# Patient Record
Sex: Male | Born: 1980 | Race: White | Hispanic: No | Marital: Married | Smoking: Current every day smoker
Health system: Southern US, Community
[De-identification: ages and names within clinical notes are randomized; demographics above are authoritative.]

## PROBLEM LIST (undated history)

## (undated) DIAGNOSIS — F32A Depression, unspecified: Secondary | ICD-10-CM

## (undated) DIAGNOSIS — S8990XA Unspecified injury of unspecified lower leg, initial encounter: Secondary | ICD-10-CM

## (undated) DIAGNOSIS — F329 Major depressive disorder, single episode, unspecified: Secondary | ICD-10-CM

## (undated) DIAGNOSIS — I10 Essential (primary) hypertension: Secondary | ICD-10-CM

## (undated) DIAGNOSIS — F419 Anxiety disorder, unspecified: Secondary | ICD-10-CM

## (undated) HISTORY — DX: Essential (primary) hypertension: I10

## (undated) HISTORY — DX: Anxiety disorder, unspecified: F41.9

## (undated) HISTORY — DX: Depression, unspecified: F32.A

## (undated) HISTORY — PX: TONSILECTOMY, ADENOIDECTOMY, BILATERAL MYRINGOTOMY AND TUBES: SHX2538

## (undated) HISTORY — DX: Unspecified injury of unspecified lower leg, initial encounter: S89.90XA

## (undated) HISTORY — PX: DENTAL SURGERY: SHX609

## (undated) HISTORY — DX: Major depressive disorder, single episode, unspecified: F32.9

---
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
## 2005-11-25 ENCOUNTER — Ambulatory Visit: Payer: Self-pay | Admitting: Family Medicine

## 2006-01-26 ENCOUNTER — Ambulatory Visit: Payer: Self-pay | Admitting: Family Medicine

## 2006-03-11 ENCOUNTER — Ambulatory Visit: Payer: Self-pay | Admitting: Family Medicine

## 2006-06-01 DIAGNOSIS — F419 Anxiety disorder, unspecified: Secondary | ICD-10-CM | POA: Insufficient documentation

## 2006-06-01 DIAGNOSIS — G43909 Migraine, unspecified, not intractable, without status migrainosus: Secondary | ICD-10-CM | POA: Insufficient documentation

## 2006-06-01 DIAGNOSIS — F411 Generalized anxiety disorder: Secondary | ICD-10-CM

## 2006-06-03 ENCOUNTER — Ambulatory Visit: Payer: Self-pay | Admitting: Family Medicine

## 2006-06-04 DIAGNOSIS — G4700 Insomnia, unspecified: Secondary | ICD-10-CM

## 2006-06-04 DIAGNOSIS — Z87891 Personal history of nicotine dependence: Secondary | ICD-10-CM

## 2006-06-04 DIAGNOSIS — F17200 Nicotine dependence, unspecified, uncomplicated: Secondary | ICD-10-CM

## 2006-06-04 HISTORY — DX: Nicotine dependence, unspecified, uncomplicated: F17.200

## 2006-11-24 ENCOUNTER — Ambulatory Visit: Payer: Self-pay | Admitting: Family Medicine

## 2006-11-24 DIAGNOSIS — F322 Major depressive disorder, single episode, severe without psychotic features: Secondary | ICD-10-CM | POA: Insufficient documentation

## 2006-11-24 DIAGNOSIS — F329 Major depressive disorder, single episode, unspecified: Secondary | ICD-10-CM

## 2006-11-24 DIAGNOSIS — J45990 Exercise induced bronchospasm: Secondary | ICD-10-CM

## 2007-07-06 ENCOUNTER — Ambulatory Visit: Payer: Self-pay | Admitting: Family Medicine

## 2007-07-06 DIAGNOSIS — N529 Male erectile dysfunction, unspecified: Secondary | ICD-10-CM | POA: Insufficient documentation

## 2007-07-06 DIAGNOSIS — F528 Other sexual dysfunction not due to a substance or known physiological condition: Secondary | ICD-10-CM | POA: Insufficient documentation

## 2008-02-24 ENCOUNTER — Ambulatory Visit: Payer: Self-pay | Admitting: Family Medicine

## 2008-02-24 DIAGNOSIS — F410 Panic disorder [episodic paroxysmal anxiety] without agoraphobia: Secondary | ICD-10-CM | POA: Insufficient documentation

## 2008-05-07 ENCOUNTER — Ambulatory Visit: Payer: Self-pay | Admitting: Family Medicine

## 2008-05-07 DIAGNOSIS — I10 Essential (primary) hypertension: Secondary | ICD-10-CM | POA: Insufficient documentation

## 2008-05-28 ENCOUNTER — Ambulatory Visit: Payer: Self-pay | Admitting: Family Medicine

## 2008-06-08 ENCOUNTER — Telehealth: Payer: Self-pay | Admitting: Family Medicine

## 2008-06-11 ENCOUNTER — Ambulatory Visit: Payer: Self-pay | Admitting: Family Medicine

## 2008-07-30 ENCOUNTER — Ambulatory Visit: Payer: Self-pay | Admitting: Family Medicine

## 2008-07-31 ENCOUNTER — Encounter: Payer: Self-pay | Admitting: Family Medicine

## 2008-07-31 LAB — CONVERTED CEMR LAB
BUN: 15 mg/dL (ref 6–23)
CO2: 21 meq/L (ref 19–32)
Calcium: 9.7 mg/dL (ref 8.4–10.5)
Chloride: 101 meq/L (ref 96–112)
Creatinine, Ser: 1.1 mg/dL (ref 0.40–1.50)
Glucose, Bld: 95 mg/dL (ref 70–99)
Potassium: 3.9 meq/L (ref 3.5–5.3)
Sodium: 138 meq/L (ref 135–145)

## 2008-10-11 ENCOUNTER — Ambulatory Visit: Payer: Self-pay | Admitting: Family Medicine

## 2008-10-11 LAB — CONVERTED CEMR LAB: Rapid Strep: NEGATIVE

## 2008-12-10 ENCOUNTER — Ambulatory Visit: Payer: Self-pay | Admitting: Family Medicine

## 2009-02-05 ENCOUNTER — Ambulatory Visit: Payer: Self-pay | Admitting: Family Medicine

## 2009-04-22 ENCOUNTER — Telehealth (INDEPENDENT_AMBULATORY_CARE_PROVIDER_SITE_OTHER): Payer: Self-pay | Admitting: *Deleted

## 2009-08-29 ENCOUNTER — Telehealth: Payer: Self-pay | Admitting: Family Medicine

## 2009-09-25 ENCOUNTER — Ambulatory Visit: Payer: Self-pay | Admitting: Family Medicine

## 2009-09-25 ENCOUNTER — Telehealth (INDEPENDENT_AMBULATORY_CARE_PROVIDER_SITE_OTHER): Payer: Self-pay | Admitting: *Deleted

## 2009-09-27 ENCOUNTER — Telehealth (INDEPENDENT_AMBULATORY_CARE_PROVIDER_SITE_OTHER): Payer: Self-pay | Admitting: *Deleted

## 2009-10-01 ENCOUNTER — Encounter: Payer: Self-pay | Admitting: Family Medicine

## 2009-10-11 ENCOUNTER — Encounter: Payer: Self-pay | Admitting: Family Medicine

## 2009-10-18 ENCOUNTER — Telehealth: Payer: Self-pay | Admitting: Family Medicine

## 2010-09-23 NOTE — Progress Notes (Signed)
Summary: Alprazolam refill  Phone Note Refill Request Message from:  Pharmacy on August 29, 2009 4:51 PM  Refills Requested: Medication #1:  ALPRAZOLAM 0.5 MG  TABS 1 tab by mouth daily as needed for anxiety Initial call taken by: Payton Spark CMA,  August 29, 2009 4:51 PM    Prescriptions: ALPRAZOLAM 0.5 MG  TABS (ALPRAZOLAM) 1 tab by mouth daily as needed for anxiety  #30 x 0   Entered and Authorized by:   Seymour Bars DO   Signed by:   Seymour Bars DO on 08/29/2009   Method used:   Printed then faxed to ...       [facility]  [place] [phone]       [address] [place], [place]  [address]       Ph: [phone] or [phone]       Fax: [phone]   RxID:   [id number]   Appended Document: Alprazolam refill Pt aware

## 2010-09-23 NOTE — Medication Information (Signed)
Summary: Possible Nonadherence with Lisinopril/[employer]  Possible Nonadherence with Lisinopril/[employer]   Imported By: Lanelle Bal 10/17/2009 12:52:45  _____________________________________________________________________  External Attachment:    Type:   Image     Comment:   External Document

## 2010-09-23 NOTE — Progress Notes (Signed)
Summary: rx  Phone Note Refill Request Message from:  Pharm[facility] on [facility] [address] [id number]  Refills Requested: Medication #1:  PAXIL 20 MG  TABS 1 tab by mouth daily Initial call taken by: Kandice Hams,  September 25, 2009 7:57 AM    Prescriptions: PAXIL 20 MG  TABS (PAROXETINE HCL) 1 tab by mouth daily  #30 x 0   Entered by:   Kandice Hams   Authorized by:   Seymour Bars DO   Signed by:   Kandice Hams on 09/25/2009   Method used:   Faxed to ...       [facility] [address] (retail)       [address]       [place], [place]  [address]       Ph: [phone] or [phone]       Fax: [phone]   RxID:   [id number]

## 2010-09-23 NOTE — Progress Notes (Signed)
Summary: Alprazolam refill  Phone Note Refill Request   Refills Requested: Medication #1:  ALPRAZOLAM 0.5 MG  TABS 1 tab by mouth daily as needed for anxiety Initial call taken by: Payton Spark CMA,  October 18, 2009 3:06 PM    Prescriptions: ALPRAZOLAM 0.5 MG  TABS (ALPRAZOLAM) 1 tab by mouth daily as needed for anxiety  #30 x 0   Entered and Authorized by:   Seymour Bars DO   Signed by:   Seymour Bars DO on 10/18/2009   Method used:   Printed then faxed to ...       [facility] [address] (retail)       [address]       [place], [place]  [address]       Ph: [phone] or [phone]       Fax: [phone]   RxID:   [id number]

## 2010-09-23 NOTE — Assessment & Plan Note (Signed)
Summary: ED   Vital Signs:  Patient profile:   30 year old male Height:      71 inches Weight:      216 pounds Pulse rate:   97 / minute BP sitting:   116 / 64  (left arm) Cuff size:   regular  Vitals Entered By: Kathlene November (September 25, 2009 2:17 PM) CC: home BP readings high 160/100. Hasn't smoked in 4 days   Primary Care Provider:  Seymour Bars DO  CC:  home BP readings high 160/100. Hasn't smoked in 4 days.  History of Present Illness: 30 yo WM presents for problems with ED.  This is a new problem.  His wife left him about 6 mos ago and he's had the stress of separation, sharing time with the kids.  He has a new girlfiend and has had some problems getting an erection.  He is on muliple meds that can cause this - Paxil, BP meds.  His HTN itself is a risk factor for ED.  He denies feeling depressed and his friends have been supportive.  He is trying to quit smoking again.    His home BPs have been running higher than here.    Current Medications (verified): 1)  Alprazolam 0.5 Mg  Tabs (Alprazolam) .Marland Kitchen.. 1 Tab By Mouth Daily As Needed For Anxiety 2)  Ventolin Hfa 108 (90 Base) Mcg/act Aers (Albuterol Sulfate) .Marland Kitchen.. 1-2 Puffs Q 4-6 H Prn 3)  Paxil 20 Mg  Tabs (Paroxetine Hcl) .Marland Kitchen.. 1 Tab By Mouth Daily 4)  Lisinopril-Hydrochlorothiazide 20-12.5 Mg Tabs (Lisinopril-Hydrochlorothiazide) .Marland Kitchen.. 1 Tab By Mouth Daily  Allergies (verified): No Known Drug Allergies  Comments:  Nurse/Medical Assistant: The patient's medications and allergies were reviewed with the patient and were updated in the Medication and Allergy Lists. Kathlene November (September 25, 2009 2:18 PM)  Past History:  Past Medical History: Reviewed history from 02/05/2009 and no changes required. previous knee injury anxiety/ depression mild intermittent asthma HTN  Family History: Reviewed history from 06/01/2006 and no changes required. father committed suicide, depression, mother high chol, HTN  Social  History: Works [employer]. Separated from  Mabel and has 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.  Quit smoking 3/07 after 4 pk years.  Runs 15-20  min 2 x a wk.  Review of Systems      See HPI  Physical Exam  General:  alert, well-developed, well-nourished, and well-hydrated.   Head:  normocephalic and atraumatic.   Neck:  no masses.   Lungs:  Normal respiratory effort, chest expands symmetrically. Lungs are clear to auscultation, no crackles or wheezes. Heart:  Normal rate and regular rhythm. S1 and S2 normal without gallop, murmur, click, rub or other extra sounds. Skin:  color normal.   Psych:  good eye contact, not anxious appearing, and not depressed appearing.     Impression & Recommendations:  Problem # 1:  HYPERTENSION, BENIGN (ICD-401.1) BP looks great here so will continue current meds.  I truly wonder if his home BP cuff is accurate.  He will return with his home meter in 3 wks. His updated medication list for this problem includes:    Lisinopril-hydrochlorothiazide 20-12.5 Mg Tabs (Lisinopril-hydrochlorothiazide) .Marland Kitchen... 1 tab by mouth daily  BP today: 116/64 Prior BP: 130/78 (02/05/2009)  Prior 10 Yr Risk Heart Disease: Not enough information (05/07/2008)  Labs Reviewed: K+: 3.9 (07/31/2008) Creat: : 1.10 (07/31/2008)     Problem # 2:  ERECTILE DYSFUNCTION (ICD-302.72) Assessment: New Likely to be related  to RX meds or recent stressor of marrital separation/ new relationships. Trial of Cialis. His updated medication list for this problem includes:    Cialis 10 Mg Tabs (Tadalafil) .Marland Kitchen... 1 tab by mouth as needed for erectile dysfunction  Orders: T-Testosterone; Total [phone])  Problem # 3:  DISORDER, DEPRESSIVE NEC (ICD-311) Depression/ Panic has much improved with Paxil -- continue. His updated medication list for this problem includes:    Alprazolam 0.5 Mg Tabs (Alprazolam) .Marland Kitchen... 1 tab by mouth daily as needed for anxiety    Paxil 20 Mg Tabs (Paroxetine hcl)  .Marland Kitchen... 1 tab by mouth daily  Complete Medication List: 1)  Alprazolam 0.5 Mg Tabs (Alprazolam) .Marland Kitchen.. 1 tab by mouth daily as needed for anxiety 2)  Ventolin Hfa 108 (90 Base) Mcg/act Aers (Albuterol sulfate) .Marland Kitchen.. 1-2 puffs q 4-6 h prn 3)  Paxil 20 Mg Tabs (Paroxetine hcl) .Marland Kitchen.. 1 tab by mouth daily 4)  Lisinopril-hydrochlorothiazide 20-12.5 Mg Tabs (Lisinopril-hydrochlorothiazide) .Marland Kitchen.. 1 tab by mouth daily 5)  Cialis 10 Mg Tabs (Tadalafil) .Marland Kitchen.. 1 tab by mouth as needed for erectile dysfunction  Other Orders: T-Comprehensive Metabolic Panel [phone]) T-Lipid Profile ([id number])  Patient Instructions: 1)  Have fasting labs updated. 2)  Will call you w/ results. 3)  Try Cialis as needed. 4)  Call if any problems. 5)  REturn for a nurse BP check with your home cuff in 3-4 wks. 6)  Return for f/u BP in 6 mos. Prescriptions: LISINOPRIL-HYDROCHLOROTHIAZIDE 20-12.5 MG TABS (LISINOPRIL-HYDROCHLOROTHIAZIDE) 1 tab by mouth daily  #30 x 6   Entered and Authorized by:   Seymour Bars DO   Signed by:   Seymour Bars DO on 09/25/2009   Method used:   Electronically to        [facility] [phone][facility] (retail)       [address]       [place], [place]  [address]       Ph: [phone] or [phone]       Fax: [phone]   RxID:   [id number] CIALIS 10 MG TABS (TADALAFIL) 1 tab by mouth as needed for erectile dysfunction  #12 x 1   Entered and Authorized by:   Seymour Bars DO   Signed by:   Seymour Bars DO on 09/25/2009   Method used:   Electronically to        [facility] [phone]* (retail)       [address]       [place], [place]  [address]       Ph: [phone] or [phone]       Fax: [phone]   RxID:   [id number]

## 2010-09-23 NOTE — Progress Notes (Signed)
Summary: PRIOR AUTH   Phone Note From Pharmacy   [facility]  [place] [phone][facility] Summary of Call: prior auth required [employer] [phone] CIALIS 10 MG Initial call taken by: Kandice Hams,  September 27, 2009 8:47 AM  Follow-up for Phone Call        [employer] WILL ADVISE ON PRIOR AUTH ON CIALIS WITHIN 48 HOURS HOWEVER THE MAX HE CAN GET AT ONE TIME IS 8 Roselle Locus  September 30, 2009 2:59 PM

## 2010-09-23 NOTE — Medication Information (Signed)
Summary: Denial for Cialis/[employer]  Denial for Cialis/[employer]   Imported By: Lanelle Bal 10/11/2009 11:06:29  _____________________________________________________________________  External Attachment:    Type:   Image     Comment:   External Document

## 2010-09-29 ENCOUNTER — Telehealth (INDEPENDENT_AMBULATORY_CARE_PROVIDER_SITE_OTHER): Payer: Self-pay | Admitting: *Deleted

## 2010-10-07 ENCOUNTER — Ambulatory Visit: Payer: Self-pay | Admitting: Family Medicine

## 2010-10-09 NOTE — Progress Notes (Signed)
Summary: Refill meds  Phone Note Refill Request Call back at [phone] Message from:  Patient on September 29, 2010 4:47 PM  Refills Requested: Medication #1:  PAXIL 20 MG  TABS 1 tab by mouth daily   Dosage confirmed as above?Dosage Confirmed   Brand Name Necessary? No   Supply Requested: 1 month  Medication #2:  ALPRAZOLAM 0.5 MG  TABS 1 tab by mouth daily as needed for anxiety   Dosage confirmed as above?Dosage Confirmed   Brand Name Necessary? No   Supply Requested: 1 month  Method Requested: Electronic Initial call taken by: Lannette Donath,  September 29, 2010 4:47 PM  Follow-up for Phone Call        Pt to make OV Follow-up by: Payton Spark CMA,  September 29, 2010 4:53 PM

## 2010-10-22 ENCOUNTER — Ambulatory Visit
Admission: RE | Admit: 2010-10-22 | Discharge: 2010-10-22 | Disposition: A | Discharge status: Home / Self Care | Payer: 59 | Source: Ambulatory Visit | Attending: Family Medicine | Admitting: Family Medicine

## 2010-10-22 ENCOUNTER — Encounter: Payer: Self-pay | Admitting: Family Medicine

## 2010-10-22 ENCOUNTER — Ambulatory Visit (INDEPENDENT_AMBULATORY_CARE_PROVIDER_SITE_OTHER): Payer: 59 | Admitting: Family Medicine

## 2010-10-22 ENCOUNTER — Other Ambulatory Visit: Payer: Self-pay | Admitting: Family Medicine

## 2010-10-22 DIAGNOSIS — IMO0002 Reserved for concepts with insufficient information to code with codable children: Secondary | ICD-10-CM | POA: Insufficient documentation

## 2010-10-22 DIAGNOSIS — M79609 Pain in unspecified limb: Secondary | ICD-10-CM | POA: Insufficient documentation

## 2010-10-23 ENCOUNTER — Telehealth: Payer: Self-pay | Admitting: Family Medicine

## 2010-10-28 ENCOUNTER — Telehealth: Payer: Self-pay | Admitting: Family Medicine

## 2010-10-29 ENCOUNTER — Encounter: Payer: Self-pay | Admitting: Family Medicine

## 2010-10-29 ENCOUNTER — Ambulatory Visit (INDEPENDENT_AMBULATORY_CARE_PROVIDER_SITE_OTHER): Payer: 59 | Admitting: Family Medicine

## 2010-10-29 DIAGNOSIS — M25519 Pain in unspecified shoulder: Secondary | ICD-10-CM

## 2010-10-29 DIAGNOSIS — IMO0002 Reserved for concepts with insufficient information to code with codable children: Secondary | ICD-10-CM

## 2010-10-30 NOTE — Progress Notes (Signed)
Summary: Pain meds  Phone Note Call from Patient   Caller: Patient Summary of Call: Pt states Percocet is not helping at all w/ pain. Pt would like to know if you can change Rx or increase dose. Please advise. Initial call taken by: Payton Spark CMA,  October 23, 2010 12:00 PM  Follow-up for Phone Call        can increase to 2 tabs q 6 hrs as needed severe pain -- do not exceed this or add anymore tylenol.  Can take Aleve 2 tabs by mouth two times a day in addition -- this is the max.  Will send in a muscle relaxer to help with pain. Follow-up by: Seymour Bars DO,  October 23, 2010 7:39 PM    New/Updated Medications: FLEXERIL 10 MG TABS (CYCLOBENZAPRINE HCL) 1/2 to 1 tab by mouth q 8 hrs as needed muscle spasm Prescriptions: FLEXERIL 10 MG TABS (CYCLOBENZAPRINE HCL) 1/2 to 1 tab by mouth q 8 hrs as needed muscle spasm  #30 x 0   Entered and Authorized by:   Seymour Bars DO   Signed by:   Seymour Bars DO on 10/23/2010   Method used:   Electronically to        [facility] [phone]* (retail)       [address]       [place], [place]  [address]       Ph: [phone] or [phone]       Fax: [phone]   RxID:   [id number]   Appended Document: Pain meds Pt aware of the above
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
## 2010-11-04 NOTE — Progress Notes (Signed)
Summary: [facility]-Refill Percocet  Phone Note Call from Patient Call back at [phone]   Caller: Patient Reason for Call: Refill Medication Summary of Call: pt increased percocet per 10/23/10 phone note and is now out of meds.  Pt has appt to follow up with Dr. Cathey Endow tomorrow.  Pt is taking Aleve and Flexeril, but continues to have pain.  Pt would like refill on percocet.  Advised pt Dr. Cathey Endow out of office and Dr. Eppie Gibson may not refill.  Pt voices understanding and is agreeable.  Pt is willing to come today for office visit if needed. Initial call taken by: Francee Piccolo CMA Duncan Dull),  October 28, 2010 9:59 AM  Follow-up for Phone Call        Can call in 4 tabs of hydrocodone 5/500mg  2 every 6 hours as needed,  until visit tomorrow with Dr. Cathey Endow.  Follow-up by: Nani Gasser MD,  October 28, 2010 10:06 AM  Additional Follow-up for Phone Call Additional follow up Details #1::        rx telephoned to pharm.  Pt aware. Additional Follow-up by: Francee Piccolo CMA (AAMA),  October 28, 2010 11:00 AM    New/Updated Medications: HYDROCODONE-ACETAMINOPHEN 5-500 MG TABS (HYDROCODONE-ACETAMINOPHEN) take 2 tablets every 6 hours as needed Prescriptions: HYDROCODONE-ACETAMINOPHEN 5-500 MG TABS (HYDROCODONE-ACETAMINOPHEN) take 2 tablets every 6 hours as needed  #4 x 0   Entered by:   Francee Piccolo CMA (AAMA)   Authorized by:   Nani Gasser MD   Signed by:   Francee Piccolo CMA (AAMA) on 10/28/2010   Method used:   Telephoned to ...       [facility] [address] (retail)       [address]       [place], [place]  [address]       Ph: [phone] or [phone]       Fax: [phone]   RxID:   [id number]   Appended Document: [facility]-Refill Percocet rx called to [facility].

## 2011-01-13 ENCOUNTER — Other Ambulatory Visit: Payer: Self-pay | Admitting: Family Medicine

## 2011-01-14 ENCOUNTER — Other Ambulatory Visit: Payer: Self-pay | Admitting: Family Medicine

## 2011-01-14 DIAGNOSIS — F329 Major depressive disorder, single episode, unspecified: Secondary | ICD-10-CM

## 2011-01-14 MED ORDER — PAROXETINE HCL 20 MG PO TABS: 20.0000 mg | ORAL_TABLET | ORAL | Status: DC
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
## 2011-02-09 ENCOUNTER — Other Ambulatory Visit: Payer: Self-pay | Admitting: Family Medicine

## 2011-02-09 ENCOUNTER — Ambulatory Visit (INDEPENDENT_AMBULATORY_CARE_PROVIDER_SITE_OTHER): Payer: 59 | Admitting: Family Medicine

## 2011-02-09 ENCOUNTER — Encounter: Payer: Self-pay | Admitting: Family Medicine

## 2011-02-09 DIAGNOSIS — L819 Disorder of pigmentation, unspecified: Secondary | ICD-10-CM

## 2011-02-09 DIAGNOSIS — F528 Other sexual dysfunction not due to a substance or known physiological condition: Secondary | ICD-10-CM

## 2011-02-09 DIAGNOSIS — L810 Postinflammatory hyperpigmentation: Secondary | ICD-10-CM

## 2011-02-09 DIAGNOSIS — F410 Panic disorder [episodic paroxysmal anxiety] without agoraphobia: Secondary | ICD-10-CM

## 2011-02-09 DIAGNOSIS — I10 Essential (primary) hypertension: Secondary | ICD-10-CM

## 2011-02-09 MED ORDER — TRETINOIN 0.1 % EX CREA: TOPICAL_CREAM | Freq: Every day | CUTANEOUS | Status: DC

## 2011-02-09 MED ORDER — LISINOPRIL-HYDROCHLOROTHIAZIDE 20-12.5 MG PO TABS: 1.0000 | ORAL_TABLET | Freq: Every day | ORAL | Status: DC

## 2011-02-09 MED ORDER — ALPRAZOLAM 0.5 MG PO TABS: 0.5000 mg | ORAL_TABLET | Freq: Every day | ORAL | Status: DC | PRN

## 2011-02-09 MED ORDER — TADALAFIL 10 MG PO TABS: ORAL_TABLET | ORAL | Status: DC

## 2011-02-09 NOTE — Progress Notes (Signed)
Addended by: Ellsworth Lennox on: 02/09/2011 04:32 PM   Modules accepted: Orders

## 2011-02-09 NOTE — Assessment & Plan Note (Signed)
Likely secondary to paxil, high BP, smoking.  Will stop the Paxil first, using Cialis prn.
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
## 2011-02-09 NOTE — Assessment & Plan Note (Signed)
Much improved.  Life stressors a little better and having ED from Paxil after 1 yr of use.  Will wean him off and use Alprazolam as needed.  Let me know if ED does not improve after 3 wks.  May benefit from counseling.

## 2011-02-09 NOTE — Assessment & Plan Note (Signed)
BP at the [facility] but he forgot to take his meds today.  If erectile dysfunction does not improve off Paxil, call back.  May need to also change his BP medicine.

## 2011-02-09 NOTE — Assessment & Plan Note (Signed)
Will try Tretinoin topical cream on the R hand scar to see if this lightens the scar.  If not working after 4 wks, we can try hydroquinone or get him in with plastic surgery.

## 2011-02-25 ENCOUNTER — Encounter: Payer: Self-pay | Admitting: Family Medicine

## 2011-02-26 ENCOUNTER — Ambulatory Visit (INDEPENDENT_AMBULATORY_CARE_PROVIDER_SITE_OTHER): Payer: 59 | Admitting: Family Medicine

## 2011-02-26 ENCOUNTER — Encounter: Payer: Self-pay | Admitting: Family Medicine

## 2011-02-26 DIAGNOSIS — F528 Other sexual dysfunction not due to a substance or known physiological condition: Secondary | ICD-10-CM

## 2011-02-26 DIAGNOSIS — M546 Pain in thoracic spine: Secondary | ICD-10-CM | POA: Insufficient documentation

## 2011-02-26 DIAGNOSIS — S335XXA Sprain of ligaments of lumbar spine, initial encounter: Secondary | ICD-10-CM

## 2011-02-26 DIAGNOSIS — R300 Dysuria: Secondary | ICD-10-CM

## 2011-02-26 MED ORDER — OXYCODONE-ACETAMINOPHEN 5-325 MG PO TABS: 1.0000 | ORAL_TABLET | Freq: Every day | ORAL | Status: DC | PRN

## 2011-02-26 MED ORDER — METAXALONE 800 MG PO TABS: 800.0000 mg | ORAL_TABLET | Freq: Three times a day (TID) | ORAL | Status: AC

## 2011-02-26 MED ORDER — AMLODIPINE BESYLATE 5 MG PO TABS: 5.0000 mg | ORAL_TABLET | Freq: Every day | ORAL | Status: DC

## 2011-02-26 NOTE — Patient Instructions (Signed)
For back spasm:  Use Skelaxin up to 3 x a day. OK to use Aleve or ibuprofen for anti inflammatory. Use Percocet at bedtime as needed.  Change Lisinopril/ HCTZ to Norvasc for blood pressure and use Norvasc 5 mg/ day.  Trial of viagra as needed.  Return for f/u BP / ED in 2-3 mos.
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
## 2011-02-26 NOTE — Assessment & Plan Note (Signed)
Addressed, treated and is improving per UC.

## 2011-02-26 NOTE — Assessment & Plan Note (Signed)
Still a problem off Paxil.  Cialis didn't work well.  Will have him try samples of viagra. I do think given his age and stable relationship that it could be from his BP medicine.  Will change him to Norvasc and call if any problems.  F/u BP in 3 mos.

## 2011-03-03 ENCOUNTER — Telehealth: Payer: Self-pay | Admitting: Family Medicine

## 2011-03-03 MED ORDER — OXYCODONE-ACETAMINOPHEN 5-325 MG PO TABS: ORAL_TABLET | ORAL | Status: DC

## 2011-03-03 NOTE — Telephone Encounter (Signed)
Pt informed another Rx of stronger dose printed out and he could pup.  Told pt for short term only. Philip Newcomer, LPN [facility]

## 2011-03-03 NOTE — Telephone Encounter (Signed)
RX printed for pick up.  This is short term only.

## 2011-03-03 NOTE — Telephone Encounter (Signed)
Pt called and needs a refill of his Percocet 5/325 mg and he feels he needs a stronger dose.  Given pain  med on 02-26-11 and it was ordered to take daily PRN, and pt now out.  Averaging taking 2 daily.  Please advise.  Was also given skelexin  And told could use ibuprofen, heat and avoid heavy lifting.  Told to follow up in two weeks. Plan:  Routed to Dr. Arlice Colt, LPN [facility]

## 2011-03-10 ENCOUNTER — Other Ambulatory Visit: Payer: Self-pay | Admitting: Family Medicine

## 2011-04-10 ENCOUNTER — Other Ambulatory Visit: Payer: Self-pay | Admitting: Family Medicine

## 2011-04-10 MED ORDER — TADALAFIL 20 MG PO TABS: 20.0000 mg | ORAL_TABLET | Freq: Every day | ORAL | Status: DC | PRN

## 2011-04-10 NOTE — Telephone Encounter (Signed)
OK for the 20mg  sample.

## 2011-04-10 NOTE — Telephone Encounter (Signed)
Pt notified that he could pup sample of cialis 20 mg # 3 pills of samples. Jarvis Newcomer, LPN [facility]

## 2011-04-10 NOTE — Telephone Encounter (Signed)
Pt calling requesting cialis 20 mg samples.   Plan:  Reviewed medication list in chart and no ED meds are listed.  I see in June where pt was last seen the cialis 10 mg was discontinued.  Pt stated he got cialis 20 mg since that time as samples, but I do not see a telephone note to this affect.   Can you please give me the okay to give this pt samples of cialis 20 mg.  Pt stated the 10 mg was dc'd because they didn't work but when changed to the 20 mg it worked , but requesting samples because he is a appeal with his insurance co to get the med covered.   Routed to Dr. Marlyne Beards, LPN [facility]

## 2011-05-15 ENCOUNTER — Telehealth: Payer: Self-pay | Admitting: Family Medicine

## 2011-05-15 MED ORDER — TADALAFIL 20 MG PO TABS: 20.0000 mg | ORAL_TABLET | Freq: Every day | ORAL | Status: DC | PRN

## 2011-05-15 NOTE — Telephone Encounter (Signed)
Pt called wanting samples of cialis medication.  In battle with insurance trying to get them to pay for the medication and still the case is pending. Plan:  Cialis 20 mg #3 samples given.  Pt informed. Jarvis Newcomer, LPN [facility]

## 2011-06-16 ENCOUNTER — Ambulatory Visit: Payer: 59 | Admitting: Family Medicine

## 2011-10-29 ENCOUNTER — Telehealth: Payer: Self-pay | Admitting: *Deleted

## 2011-10-29 ENCOUNTER — Other Ambulatory Visit: Payer: Self-pay | Admitting: *Deleted

## 2011-10-29 MED ORDER — TADALAFIL 20 MG PO TABS: 20.0000 mg | ORAL_TABLET | Freq: Every day | ORAL | Status: DC | PRN

## 2011-10-29 NOTE — Telephone Encounter (Signed)
Pt would like to know if we can refill his xanax. Xanax was given once back in 01/2011. Please advise.

## 2011-10-29 NOTE — Telephone Encounter (Signed)
Ok to refill 

## 2011-10-30 ENCOUNTER — Other Ambulatory Visit: Payer: Self-pay | Admitting: *Deleted

## 2011-10-30 MED ORDER — ALPRAZOLAM 0.5 MG PO TABS: 0.5000 mg | ORAL_TABLET | Freq: Every evening | ORAL | Status: DC | PRN

## 2011-10-30 NOTE — Telephone Encounter (Signed)
Rx printed

## 2011-11-02 ENCOUNTER — Other Ambulatory Visit: Payer: Self-pay | Admitting: *Deleted

## 2011-11-02 MED ORDER — TADALAFIL 20 MG PO TABS: 20.0000 mg | ORAL_TABLET | Freq: Every day | ORAL | Status: DC | PRN

## 2011-11-06 ENCOUNTER — Other Ambulatory Visit: Payer: Self-pay | Admitting: *Deleted

## 2011-11-06 MED ORDER — TADALAFIL 20 MG PO TABS: 20.0000 mg | ORAL_TABLET | Freq: Every day | ORAL | Status: DC | PRN

## 2011-11-10 ENCOUNTER — Encounter: Payer: Self-pay | Admitting: Family Medicine

## 2011-11-10 ENCOUNTER — Ambulatory Visit (INDEPENDENT_AMBULATORY_CARE_PROVIDER_SITE_OTHER): Payer: 59 | Admitting: Family Medicine

## 2011-11-10 VITALS — BP 143/90 | HR 80 | Ht 71.0 in | Wt 221.0 lb

## 2011-11-10 DIAGNOSIS — M25462 Effusion, left knee: Secondary | ICD-10-CM

## 2011-11-10 DIAGNOSIS — I10 Essential (primary) hypertension: Secondary | ICD-10-CM

## 2011-11-10 DIAGNOSIS — N529 Male erectile dysfunction, unspecified: Secondary | ICD-10-CM

## 2011-11-10 DIAGNOSIS — S8002XA Contusion of left knee, initial encounter: Secondary | ICD-10-CM

## 2011-11-10 DIAGNOSIS — M25469 Effusion, unspecified knee: Secondary | ICD-10-CM

## 2011-11-10 MED ORDER — VALSARTAN-HYDROCHLOROTHIAZIDE 80-12.5 MG PO TABS: 1.0000 | ORAL_TABLET | Freq: Every day | ORAL | Status: DC

## 2011-11-10 MED ORDER — OXYCODONE-ACETAMINOPHEN 5-325 MG PO TABS: 1.0000 | ORAL_TABLET | ORAL | Status: DC | PRN

## 2011-11-10 MED ORDER — OXYCODONE-ACETAMINOPHEN 5-325 MG PO TABS: 1.0000 | ORAL_TABLET | Freq: Three times a day (TID) | ORAL | Status: DC | PRN

## 2011-11-10 MED ORDER — MELOXICAM 15 MG PO TABS: 15.0000 mg | ORAL_TABLET | Freq: Every day | ORAL | Status: DC

## 2011-11-10 MED ORDER — PANTOPRAZOLE SODIUM 40 MG PO TBEC: 40.0000 mg | DELAYED_RELEASE_TABLET | Freq: Every day | ORAL | Status: DC

## 2011-11-10 MED ORDER — TADALAFIL 5 MG PO TABS: 5.0000 mg | ORAL_TABLET | Freq: Every day | ORAL | Status: DC | PRN

## 2011-11-10 NOTE — Patient Instructions (Signed)
Hypertension As your heart beats, it forces blood through your arteries. This force is your blood pressure. If the pressure is too high, it is called hypertension (HTN) or high blood pressure. HTN is dangerous because you may have it and not know it. High blood pressure may mean that your heart has to work harder to pump blood. Your arteries may be narrow or stiff. The extra work puts you at risk for heart disease, stroke, and other problems.  Blood pressure consists of two numbers, a higher number over a lower, 110/72, for example. It is stated as "110 over 72." The ideal is below 120 for the top number (systolic) and under 80 for the bottom (diastolic). Write down your blood pressure today. You should pay close attention to your blood pressure if you have certain conditions such as:  Heart failure.   Prior heart attack.   Diabetes   Chronic kidney disease.   Prior stroke.   Multiple risk factors for heart disease.  To see if you have HTN, your blood pressure should be measured while you are seated with your arm held at the level of the heart. It should be measured at least twice. A one-time elevated blood pressure reading (especially in the Emergency Department) does not mean that you need treatment. There may be conditions in which the blood pressure is different between your right and left arms. It is important to see your caregiver soon for a recheck. Most people have essential hypertension which means that there is not a specific cause. This type of high blood pressure may be lowered by changing lifestyle factors such as:  Stress.   Smoking.   Lack of exercise.   Excessive weight.   Drug/tobacco/alcohol use.   Eating less salt.  Most people do not have symptoms from high blood pressure until it has caused damage to the body. Effective treatment can often prevent, delay or reduce that damage. TREATMENT  When a cause has been identified, treatment for high blood pressure is  directed at the cause. There are a large number of medications to treat HTN. These fall into several categories, and your caregiver will help you select the medicines that are best for you. Medications may have side effects. You should review side effects with your caregiver. If your blood pressure stays high after you have made lifestyle changes or started on medicines,   Your medication(s) may need to be changed.   Other problems may need to be addressed.   Be certain you understand your prescriptions, and know how and when to take your medicine.   Be sure to follow up with your caregiver within the time frame advised (usually within two weeks) to have your blood pressure rechecked and to review your medications.   If you are taking more than one medicine to lower your blood pressure, make sure you know how and at what times they should be taken. Taking two medicines at the same time can result in blood pressure that is too low.  SEEK IMMEDIATE MEDICAL CARE IF:  You develop a severe headache, blurred or changing vision, or confusion.   You have unusual weakness or numbness, or a faint feeling.   You have severe chest or abdominal pain, vomiting, or breathing problems.  MAKE SURE YOU:   Understand these instructions.   Will watch your condition.   Will get help right away if you are not doing well or get worse.  Document Released: 08/10/2005 Document Revised: 07/30/2011 Document Reviewed:   03/30/2008 [employer] Patient Information 2012 [place], [place].Gastroesophageal Reflux Disease, Adult Gastroesophageal reflux disease (GERD) happens when acid from your stomach flows up into the esophagus. When acid comes in contact with the esophagus, the acid causes soreness (inflammation) in the esophagus. Over time, GERD may create small holes (ulcers) in the lining of the esophagus. CAUSES   Increased body weight. This puts pressure on the stomach, making acid rise from the stomach into the  esophagus.   Smoking. This increases acid production in the stomach.   Drinking alcohol. This causes decreased pressure in the lower esophageal sphincter (valve or ring of muscle between the esophagus and stomach), allowing acid from the stomach into the esophagus.   Late evening meals and a full stomach. This increases pressure and acid production in the stomach.   A malformed lower esophageal sphincter.  Sometimes, no cause is found. SYMPTOMS   Burning pain in the lower part of the mid-chest behind the breastbone and in the mid-stomach area. This may occur twice a week or more often.   Trouble swallowing.   Sore throat.   Dry cough.   Asthma-like symptoms including chest tightness, shortness of breath, or wheezing.  DIAGNOSIS  Your caregiver may be able to diagnose GERD based on your symptoms. In some cases, X-rays and other tests may be done to check for complications or to check the condition of your stomach and esophagus. TREATMENT  Your caregiver may recommend over-the-counter or prescription medicines to help decrease acid production. Ask your caregiver before starting or adding any new medicines.  HOME CARE INSTRUCTIONS   Change the factors that you can control. Ask your caregiver for guidance concerning weight loss, quitting smoking, and alcohol consumption.   Avoid foods and drinks that make your symptoms worse, such as:   Caffeine or alcoholic drinks.   Chocolate.   Peppermint or mint flavorings.   Garlic and onions.   Spicy foods.   Citrus fruits, such as oranges, lemons, or limes.   Tomato-based foods such as sauce, chili, salsa, and pizza.   Fried and fatty foods.   Avoid lying down for the 3 hours prior to your bedtime or prior to taking a nap.   Eat small, frequent meals instead of large meals.   Wear loose-fitting clothing. Do not wear anything tight around your waist that causes pressure on your stomach.   Raise the head of your bed 6 to 8 inches  with wood blocks to help you sleep. Extra pillows will not help.   Only take over-the-counter or prescription medicines for pain, discomfort, or fever as directed by your caregiver.   Do not take aspirin, ibuprofen, or other nonsteroidal anti-inflammatory drugs (NSAIDs).  SEEK IMMEDIATE MEDICAL CARE IF:   You have pain in your arms, neck, jaw, teeth, or back.   Your pain increases or changes in intensity or duration.   You develop nausea, vomiting, or sweating (diaphoresis).   You develop shortness of breath, or you faint.   Your vomit is green, yellow, black, or looks like coffee grounds or blood.   Your stool is red, bloody, or black.  These symptoms could be signs of other problems, such as heart disease, gastric bleeding, or esophageal bleeding. MAKE SURE YOU:   Understand these instructions.   Will watch your condition.   Will get help right away if you are not doing well or get worse.  Document Released: 05/20/2005 Document Revised: 07/30/2011 Document Reviewed: 02/27/2011 [facility] Patient Information 2012 [place], [place].Place gastroesophageal reflux disease  patient instructions here.
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
## 2011-11-11 ENCOUNTER — Telehealth: Payer: Self-pay | Admitting: *Deleted

## 2011-11-11 NOTE — Telephone Encounter (Signed)
Called insurance to get PA for Valsartan HCTZ but it was denied. Pt has to try Micardis or Benicar first. Please change and resend to pharm.

## 2011-11-12 MED ORDER — OLMESARTAN MEDOXOMIL-HCTZ 40-12.5 MG PO TABS: 1.0000 | ORAL_TABLET | Freq: Every day | ORAL | Status: DC

## 2011-11-12 NOTE — Telephone Encounter (Signed)
Addended by: Hassan Rowan on: 11/12/2011 02:53 PM   Modules accepted: Orders

## 2011-11-12 NOTE — Telephone Encounter (Signed)
Changed to Benicar 40/12.5. Amazing Diovan is generic but they want the non generic which was my preference all along.

## 2011-11-13 NOTE — Telephone Encounter (Signed)
[place] paper works do we need to fl out?

## 2011-11-26 NOTE — Progress Notes (Signed)
Addended by: Hassan Rowan on: 11/26/2011 10:17 AM   Modules accepted: Orders

## 2011-11-26 NOTE — Progress Notes (Signed)
Addended by: Hassan Rowan on: 11/26/2011 07:35 PM   Modules accepted: Orders

## 2011-12-23 ENCOUNTER — Telehealth: Payer: Self-pay | Admitting: *Deleted

## 2011-12-23 DIAGNOSIS — N529 Male erectile dysfunction, unspecified: Secondary | ICD-10-CM

## 2011-12-23 MED ORDER — TADALAFIL 2.5 MG PO TABS: 2.5000 mg | ORAL_TABLET | Freq: Every day | ORAL | Status: DC

## 2011-12-23 MED ORDER — ALPRAZOLAM 0.5 MG PO TABS: 0.5000 mg | ORAL_TABLET | Freq: Every evening | ORAL | Status: DC | PRN

## 2011-12-23 NOTE — Telephone Encounter (Signed)
Pt is requesting refill for xanax and states that he needs a rx for Cialis for 30 tabs instead of the 10 previously ordered for the coupon he states he has. Please advise.

## 2011-12-23 NOTE — Telephone Encounter (Signed)
Pt informed

## 2011-12-23 NOTE — Telephone Encounter (Signed)
Will refill xanax for one month need to follow up with Dr. Thurmond Butts if going to need regular refills for sleep. Will send over Cialis for daily use and one refill.

## 2011-12-30 ENCOUNTER — Telehealth: Payer: Self-pay | Admitting: Physician Assistant

## 2011-12-30 NOTE — Telephone Encounter (Signed)
Will you call patient and see if he has tried anything other than cialis for ED. If so what are they, when, and why can't he use them. Need info to get cilias approved. If not tried anything does he want to try something else that insurance may pay for?

## 2012-01-05 ENCOUNTER — Other Ambulatory Visit: Payer: Self-pay | Admitting: Family Medicine

## 2012-01-05 ENCOUNTER — Other Ambulatory Visit: Payer: Self-pay | Admitting: *Deleted

## 2012-01-05 DIAGNOSIS — N529 Male erectile dysfunction, unspecified: Secondary | ICD-10-CM

## 2012-01-05 MED ORDER — OLMESARTAN MEDOXOMIL-HCTZ 40-12.5 MG PO TABS: 1.0000 | ORAL_TABLET | Freq: Every day | ORAL | Status: DC

## 2012-01-05 MED ORDER — TADALAFIL 5 MG PO TABS: 5.0000 mg | ORAL_TABLET | Freq: Every day | ORAL | Status: DC | PRN

## 2012-01-05 NOTE — Progress Notes (Signed)
Patient report error on previous Cialis prescription. It should have been 5 mg instead of 2.5. New prescription 5 mg will be sent to [facility] and a sample pack of 20 mg #3  was given to patient as well.

## 2012-01-08 ENCOUNTER — Encounter: Payer: Self-pay | Admitting: *Deleted

## 2012-01-08 NOTE — Telephone Encounter (Signed)
Sent prior auth today with information given by patient.

## 2012-01-08 NOTE — Telephone Encounter (Signed)
Pt states that he needs cialis bc he takes 2 HTN meds that cause ED. He has tried natural supplements in the past but they did not work. He has also tried viagra 2 years ago but it failed to work as well.

## 2012-01-12 ENCOUNTER — Ambulatory Visit (INDEPENDENT_AMBULATORY_CARE_PROVIDER_SITE_OTHER): Payer: No Typology Code available for payment source | Admitting: Family Medicine

## 2012-01-21 ENCOUNTER — Encounter: Payer: Self-pay | Admitting: *Deleted

## 2012-01-28 ENCOUNTER — Ambulatory Visit: Payer: No Typology Code available for payment source | Admitting: Family Medicine

## 2012-04-04 ENCOUNTER — Ambulatory Visit (INDEPENDENT_AMBULATORY_CARE_PROVIDER_SITE_OTHER): Payer: No Typology Code available for payment source

## 2012-04-04 ENCOUNTER — Encounter: Payer: Self-pay | Admitting: Sports Medicine

## 2012-04-04 ENCOUNTER — Ambulatory Visit (INDEPENDENT_AMBULATORY_CARE_PROVIDER_SITE_OTHER): Payer: No Typology Code available for payment source | Admitting: Sports Medicine

## 2012-04-04 ENCOUNTER — Ambulatory Visit: Payer: No Typology Code available for payment source

## 2012-04-04 VITALS — BP 151/84 | HR 78 | Temp 97.2°F | Resp 18 | Wt 225.0 lb

## 2012-04-04 DIAGNOSIS — IMO0002 Reserved for concepts with insufficient information to code with codable children: Secondary | ICD-10-CM

## 2012-04-04 DIAGNOSIS — M546 Pain in thoracic spine: Secondary | ICD-10-CM | POA: Insufficient documentation

## 2012-04-04 DIAGNOSIS — X58XXXA Exposure to other specified factors, initial encounter: Secondary | ICD-10-CM

## 2012-04-04 DIAGNOSIS — R9389 Abnormal findings on diagnostic imaging of other specified body structures: Secondary | ICD-10-CM

## 2012-04-04 DIAGNOSIS — R222 Localized swelling, mass and lump, trunk: Secondary | ICD-10-CM

## 2012-04-04 DIAGNOSIS — F528 Other sexual dysfunction not due to a substance or known physiological condition: Secondary | ICD-10-CM

## 2012-04-04 DIAGNOSIS — J398 Other specified diseases of upper respiratory tract: Secondary | ICD-10-CM | POA: Insufficient documentation

## 2012-04-04 MED ORDER — SILDENAFIL CITRATE 100 MG PO TABS: 100.0000 mg | ORAL_TABLET | ORAL | Status: DC | PRN

## 2012-04-04 MED ORDER — HYDROCODONE-ACETAMINOPHEN 10-325 MG PO TABS: 1.0000 | ORAL_TABLET | Freq: Three times a day (TID) | ORAL | Status: DC | PRN

## 2012-04-04 MED ORDER — CYCLOBENZAPRINE HCL 10 MG PO TABS: ORAL_TABLET | ORAL | Status: DC

## 2012-04-04 NOTE — Assessment & Plan Note (Signed)
Incidental finding of the thoracic spine x-rays. Obtain a chest x-ray two-view to further elucidate.
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
## 2012-04-04 NOTE — Assessment & Plan Note (Addendum)
Philip Cuevas sustained a Engineer, maintenance (IT) strain. We will start conservatively with home rehabilitation. Mobic, cyclobenzaprine. I will see him back in roughly 4 weeks. If no better, I would offer trigger point injections, put him on to formal physical therapy, and consider MRI of his thoracolumbar spine as well.

## 2012-04-04 NOTE — Assessment & Plan Note (Signed)
Patient does desire prescription for Viagra. He did not have any Cialis samples for him to use.

## 2012-04-05 ENCOUNTER — Telehealth: Payer: Self-pay | Admitting: Sports Medicine

## 2012-04-05 ENCOUNTER — Ambulatory Visit (INDEPENDENT_AMBULATORY_CARE_PROVIDER_SITE_OTHER): Payer: No Typology Code available for payment source

## 2012-04-05 DIAGNOSIS — J398 Other specified diseases of upper respiratory tract: Secondary | ICD-10-CM

## 2012-04-05 DIAGNOSIS — R222 Localized swelling, mass and lump, trunk: Secondary | ICD-10-CM

## 2012-04-05 NOTE — Telephone Encounter (Signed)
Pt informed

## 2012-04-05 NOTE — Telephone Encounter (Signed)
Hi misty,   Shoot Philip Cuevas a call and let him know his chest xray did not show the mass we saw in the spine films.  This is a good thing.  No further f/u of the "chest mass" needed. -T

## 2012-04-05 NOTE — Telephone Encounter (Signed)
LM for pt to returncall

## 2012-04-05 NOTE — Assessment & Plan Note (Signed)
Noted to be negative a repeat chest x-ray. No further followup necessary.

## 2012-04-14 ENCOUNTER — Other Ambulatory Visit: Payer: Self-pay | Admitting: *Deleted

## 2012-04-14 DIAGNOSIS — M546 Pain in thoracic spine: Secondary | ICD-10-CM

## 2012-04-14 MED ORDER — HYDROCODONE-ACETAMINOPHEN 10-325 MG PO TABS: 1.0000 | ORAL_TABLET | Freq: Three times a day (TID) | ORAL | Status: DC | PRN

## 2012-04-14 NOTE — Telephone Encounter (Signed)
rx faxed

## 2012-04-14 NOTE — Telephone Encounter (Signed)
Pt has called requesting refill on Hydrocodone. States he is still having some back pain. Please advise.

## 2012-04-14 NOTE — Telephone Encounter (Signed)
Yeah that's fine.  We can refill his hydrocodone for 30 more tabs.

## 2012-04-15 ENCOUNTER — Telehealth: Payer: Self-pay

## 2012-04-15 DIAGNOSIS — M546 Pain in thoracic spine: Secondary | ICD-10-CM

## 2012-04-15 MED ORDER — OXYCODONE-ACETAMINOPHEN 5-325 MG PO TABS: 1.0000 | ORAL_TABLET | Freq: Three times a day (TID) | ORAL | Status: DC | PRN

## 2012-04-15 NOTE — Telephone Encounter (Signed)
Patient called and states the hydrocodone make him nauseated and he work rather have percocet. Dr [name] is ok with switching the prescription to percocet as long as Philip Cuevas returns the prescription of the Hydorcodone. Patient states he will be here on Monday to pick up the prescription and return the hydrocodone.

## 2012-05-02 ENCOUNTER — Ambulatory Visit: Payer: No Typology Code available for payment source | Admitting: Family Medicine

## 2012-05-02 ENCOUNTER — Encounter: Payer: Self-pay | Admitting: Sports Medicine

## 2012-05-02 ENCOUNTER — Ambulatory Visit (INDEPENDENT_AMBULATORY_CARE_PROVIDER_SITE_OTHER): Payer: No Typology Code available for payment source | Admitting: Sports Medicine

## 2012-05-02 VITALS — BP 147/84 | HR 97 | Wt 224.0 lb

## 2012-05-02 DIAGNOSIS — M25562 Pain in left knee: Secondary | ICD-10-CM | POA: Insufficient documentation

## 2012-05-02 DIAGNOSIS — M25569 Pain in unspecified knee: Secondary | ICD-10-CM

## 2012-05-02 MED ORDER — OXYCODONE-ACETAMINOPHEN 5-325 MG PO TABS: 1.0000 | ORAL_TABLET | Freq: Three times a day (TID) | ORAL | Status: DC | PRN

## 2012-05-02 MED ORDER — OXYCODONE-ACETAMINOPHEN 10-325 MG PO TABS: 1.0000 | ORAL_TABLET | ORAL | Status: DC | PRN

## 2012-05-02 NOTE — Progress Notes (Signed)
Patient ID: Philip Cuevas, male   DOB: [birth year], 31 y.o.   MRN: [id number] Subjective:    CC: Follow up for a lumbar strain, left knee pain.  HPI: Thoracolumbar strain: Has been on meloxicam, and Flexeril, doing home rehabilitation.  Overall feeling better.  Left knee pain: Pain is present for many years. 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 Arthroscopy was recommended, however due to his job he was unable to find time. Overall he desires additional, non-operative management.  Past medical history, Surgical history, Family history, Social history, Allergies, and medications have been entered into the medical record, reviewed, and no changes needed.   Review of Systems: No fevers, chills, night sweats, weight loss, chest pain, or shortness of breath.   Objective:    General: Well Developed, well nourished, and in no acute distress.  Neuro: Alert and oriented x3, extra-ocular muscles intact.  HEENT: Normocephalic, atraumatic, pupils equal round reactive to light, neck supple, no masses, no lymphadenopathy, thyroid nonpalpable.  Skin: Warm and dry, no rashes. Cardiac: Regular rate and rhythm, no murmurs rubs or gallops.  Respiratory: Clear to auscultation bilaterally. Not using accessory muscles, speaking in full sentences. Left Knee: Normal to inspection with no erythema or effusion or obvious bony abnormalities. Palpation normal with no warmth, joint line tenderness, patellar tenderness, or condyle tenderness. ROM full in flexion and extension and lower leg rotation. Ligaments with solid consistent endpoints including ACL, PCL, LCL, MCL. Negative Mcmurray's, Apley's, and Thessalonian tests. Non painful patellar compression. Patellar glide without crepitus. Patellar and quadriceps tendons unremarkable. Hamstring and quadriceps  strength is normal.   Procedure:  Injection of left knee Consent obtained and verified. Time-out conducted. Noted no overlying erythema, induration, or other signs of local infection. Skin prepped in a sterile fashion. Topical analgesic spray: Ethyl chloride. Completed without difficulty. Meds: 25-gauge needle advanced underneath the patella from a lateral parapatellar approach. 2 cc Kenalog 40, 4 cc lidocaine injected. Pain immediately improved suggesting accurate placement of the medication. Advised to call if fevers/chills, erythema, induration, drainage, or persistent bleeding.  Impression and Recommendations:

## 2012-05-02 NOTE — Patient Instructions (Signed)
Exercise prescription:  You should adjust the intensity of your exercise based on your heart rate. The [employer] sports medicine recommends keeping your heart rate between 70-80% of its maximum for 30 minutes, 3-5 times per week. Maximum heart rate = (220 - age). Multiply this number by 0.75 to get your goal heart rate. If lower, then increase the intensity of your exercise. If the number is higher, you may decrease the intensity of your exercise. 

## 2012-05-02 NOTE — Assessment & Plan Note (Addendum)
Injection as above. Home rehabilitation for 4 weeks. Refill Percocet.  He does understand that the Percocet is not going to be a long-term medication for him. Return to clinic as needed.

## 2012-05-10 ENCOUNTER — Other Ambulatory Visit: Payer: Self-pay | Admitting: Sports Medicine

## 2012-05-10 ENCOUNTER — Telehealth: Payer: Self-pay

## 2012-05-10 DIAGNOSIS — M25562 Pain in left knee: Secondary | ICD-10-CM

## 2012-05-10 MED ORDER — OXYCODONE-ACETAMINOPHEN 10-325 MG PO TABS: 1.0000 | ORAL_TABLET | ORAL | Status: DC | PRN

## 2012-05-10 NOTE — Telephone Encounter (Signed)
Patient called and left me a message on my voice mail asking for a refill on the percocet and xanax. Please advise.

## 2012-05-10 NOTE — Telephone Encounter (Signed)
OK for one more refill on percocet but no further, I have not rx'ed him xanax We did make it clear in office visits the Percocet refills would not be long term. If he wants to cont, I could do pain clinic referral. I will place the prescription in the front for him to pick up.

## 2012-05-11 MED ORDER — ALPRAZOLAM 0.25 MG PO TABS: 0.2500 mg | ORAL_TABLET | Freq: Every evening | ORAL | Status: DC | PRN

## 2012-05-11 NOTE — Telephone Encounter (Signed)
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  He also would like a referral to pain management.

## 2012-05-11 NOTE — Telephone Encounter (Signed)
Xanax prescription filled. Referral placed for pain management.

## 2012-05-11 NOTE — Telephone Encounter (Signed)
Faxed prescription

## 2012-05-17 ENCOUNTER — Telehealth: Payer: Self-pay | Admitting: *Deleted

## 2012-05-17 DIAGNOSIS — M25562 Pain in left knee: Secondary | ICD-10-CM

## 2012-05-17 MED ORDER — OXYCODONE-ACETAMINOPHEN 10-325 MG PO TABS: 1.0000 | ORAL_TABLET | ORAL | Status: AC | PRN

## 2012-05-17 NOTE — Telephone Encounter (Signed)
Pt is asking if we can refill his pain medication until we get him into the pain clinic. He has a provider he wants to be sent to in [facility] and states he will call back with the name.

## 2012-05-17 NOTE — Telephone Encounter (Signed)
Next up would be repeat MRI, and arthroscopy. He should let me know when he is ready for this

## 2012-05-17 NOTE — Telephone Encounter (Signed)
Pt informed and will discuss at f/u appt in couple weeks.

## 2012-05-17 NOTE — Telephone Encounter (Signed)
He is that's fine, is he any better after the injection? I have left his prescription at the front.

## 2012-05-17 NOTE — Telephone Encounter (Signed)
States it felt better for about a week. Pt informed rx is up front for him to pickup.

## 2012-05-25 ENCOUNTER — Telehealth: Payer: Self-pay | Admitting: Sports Medicine

## 2012-05-25 ENCOUNTER — Other Ambulatory Visit: Payer: Self-pay | Admitting: Sports Medicine

## 2012-05-25 DIAGNOSIS — M25562 Pain in left knee: Secondary | ICD-10-CM

## 2012-05-25 MED ORDER — HYDROCODONE-ACETAMINOPHEN 10-325 MG PO TABS: 1.0000 | ORAL_TABLET | Freq: Two times a day (BID) | ORAL | Status: DC | PRN

## 2012-05-25 NOTE — Telephone Encounter (Signed)
Rx at front

## 2012-05-31 ENCOUNTER — Ambulatory Visit: Payer: No Typology Code available for payment source | Admitting: Sports Medicine

## 2012-06-06 ENCOUNTER — Telehealth: Payer: Self-pay | Admitting: *Deleted

## 2012-06-06 MED ORDER — HYDROCODONE-ACETAMINOPHEN 10-325 MG PO TABS: 1.0000 | ORAL_TABLET | Freq: Two times a day (BID) | ORAL | Status: DC | PRN

## 2012-06-06 NOTE — Telephone Encounter (Signed)
Pt states he has only #3 left of the Norco that he did indeed pick up.

## 2012-06-06 NOTE — Telephone Encounter (Signed)
Correct and script is no longer present up at front.  Ask him if his ex-wife picked it up, we can't replace if not. Also at some point we can run his name through the national database to see if it was filled at another pharmacy if he wants.

## 2012-06-06 NOTE — Telephone Encounter (Signed)
Noted, no refills, needs to f/u with pain clinic with this volume of usage.

## 2012-06-06 NOTE — Telephone Encounter (Signed)
Dr. Shelah Lewandowsky spoke with pain clinic and they said they will notify him by Friday of when his appt is, do you still want to with hold the pain meds

## 2012-06-06 NOTE — Telephone Encounter (Signed)
Pt informed he needs to come his self and p/u rx. Informed there would be no further refills and that pain clinic should call him by Friday with an appt.

## 2012-06-06 NOTE — Telephone Encounter (Signed)
I will provide one additional refill, no further. Rx at front, needs to last him at least 1 month.

## 2012-06-06 NOTE — Telephone Encounter (Signed)
Pt calls requesting a refill on his pain meds. Still waiting to hear about his pain referral. I seen where Norco was printed but not up front and called the pharmacy and they never got the rx. Only pain med on file was percocet and it was filled on 9/24

## 2012-06-07 ENCOUNTER — Encounter: Payer: Self-pay | Admitting: Physical Medicine & Rehabilitation

## 2012-06-07 ENCOUNTER — Ambulatory Visit: Payer: No Typology Code available for payment source | Admitting: Sports Medicine

## 2012-06-07 DIAGNOSIS — Z0289 Encounter for other administrative examinations: Secondary | ICD-10-CM

## 2012-06-23 ENCOUNTER — Encounter: Payer: No Typology Code available for payment source | Attending: Physical Medicine & Rehabilitation

## 2012-06-23 ENCOUNTER — Encounter: Payer: Self-pay | Admitting: Physical Medicine & Rehabilitation

## 2012-06-23 ENCOUNTER — Ambulatory Visit (HOSPITAL_BASED_OUTPATIENT_CLINIC_OR_DEPARTMENT_OTHER): Payer: No Typology Code available for payment source | Admitting: Physical Medicine & Rehabilitation

## 2012-06-23 VITALS — BP 161/99 | HR 84 | Resp 14 | Ht 71.0 in | Wt 224.0 lb

## 2012-06-23 DIAGNOSIS — M549 Dorsalgia, unspecified: Secondary | ICD-10-CM

## 2012-06-23 DIAGNOSIS — M2390 Unspecified internal derangement of unspecified knee: Secondary | ICD-10-CM | POA: Insufficient documentation

## 2012-06-23 DIAGNOSIS — Z5181 Encounter for therapeutic drug level monitoring: Secondary | ICD-10-CM

## 2012-06-23 DIAGNOSIS — M25569 Pain in unspecified knee: Secondary | ICD-10-CM | POA: Insufficient documentation

## 2012-06-23 NOTE — Patient Instructions (Addendum)
No running May do stationary bicycling Followup with primary care Sports Medicine physician

## 2012-06-23 NOTE — Progress Notes (Signed)
Subjective:    Patient ID: Philip Cuevas, male    DOB: 12-30-1980, 31 y.o.   MRN: [id number]  HPI 10 year history of left knee pain. History of playing football in high school. History of anterior cruciate ligament as well as MCL sprain. Has been evaluated by orthopedics approximately 3 or 4 years ago. MRI of the knee was performed. I do not have report. There was a loose body in the knee. . Meniscal abnormalities. Last x-ray approximately 2 years ago. Left knee was injected by primary physician approximately 2 months ago. This injection lasted for 2 weeks.  Last physical therapy was in 2001. Current exercise program includes running, sit-ups pushups stretching.  Pain Inventory Average Pain 6 Pain Right Now 5 My pain is constant, sharp and aching  In the last 24 hours, has pain interfered with the following? General activity 6 Relation with others 5 Enjoyment of life 6 What TIME of day is your pain at its worst? evening Sleep (in general) Poor  Pain is worse with: bending and some activites Pain improves with: medication Relief from Meds: 3  Mobility walk without assistance how many minutes can you walk? 30-45  ability to climb steps?  yes do you drive?  yes Do you have any goals in this area?  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  yes  Neuro/Psych No problems in this area  Prior Studies Any changes since last visit?  no  Physicians involved in your care Any changes since last visit?  no   Family History  Problem Relation Age of Onset  . Hypertension Mother   . Hyperlipidemia Mother   . Depression Father     committed suicide   History   Social History  . Marital Status: Married    Spouse Name: N/A    Number of Children: N/A  . Years of Education: N/A   Social History Main Topics  . Smoking status: Current Some Day Smoker -- 0.3 packs/day for 4 years    Types: Cigarettes    Last Attempt to Quit:  10/22/2005  . Smokeless tobacco: None  . Alcohol Use: No  . Drug Use: No  . 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 None   Social History Narrative  . None   Past Surgical History  Procedure Date  . Tonsilectomy, adenoidectomy, bilateral myringotomy and tubes   . Dental surgery    Past Medical History  Diagnosis Date  . Knee injury   . Anxiety   . Depression   . Asthma     mild, intermittent  . Hypertension    BP 161/99  Pulse 84  Resp 14  Ht 5\' 11"  (1.803 m)  Wt 224 lb (101.606 kg)  BMI 31.24 kg/m2  SpO2 100%     Review of Systems  Musculoskeletal: Positive for back pain.  All other systems reviewed and are negative.       Objective:   Physical Exam  Constitutional: He is oriented to person, place, and time. He appears well-developed and well-nourished.  HENT:  Head: Normocephalic and atraumatic.  Eyes: Conjunctivae normal and EOM are normal. Pupils are equal, round, and reactive to light.  Musculoskeletal:       Left knee: He exhibits normal range of motion, no swelling, no effusion, no deformity,  no erythema, no LCL laxity, normal patellar mobility and no MCL laxity. tenderness found. Medial joint line and patellar tendon tenderness noted.       -SLR Mild quad tendon insertion pain  Neurological: He is alert and oriented to person, place, and time. He has normal strength. He displays no atrophy and no tremor. No sensory deficit. He exhibits normal muscle tone.  Psychiatric: He has a normal mood and affect.          Assessment & Plan:  1. Left knee internal derangement. His exam is nonspecific and certainly does not show any signs of acute effusion or inflammation. I suspect he has medial meniscal tear. We discussed his treatment thus far which has included oral anti-inflammatories, topical anti-inflammatories,Cortisone injections, nonnarcotic  analgesics as well as narcotic analgesics. I discussed his exercise program and have recommended against running as this may exacerbate problems from probable meniscal injury. I strongly recommended orthopedic followup to evaluate for arthroscopic debridement as well as further assess for anterior cruciate ligament injury. My concern with treating him with narcotic analgesics would be that he would postpone definitive treatment and cause further injury to his intra-articular structures. Also preoperative usage of narcotic analgesics may also make postoperative pain management more difficult.  Patient will follow up with his primary care sports medicine physician to further discuss this. I'll not schedule followup appointment.

## 2012-06-29 ENCOUNTER — Telehealth: Payer: Self-pay

## 2012-06-29 NOTE — Telephone Encounter (Signed)
Patient says his was just taking what was in his pill bottle.  He says he has not taken anyone elses meds.  He is not sure if he is taking hydrocodone or oxycodone.  UDS inconsistent.

## 2012-06-30 NOTE — Telephone Encounter (Signed)
Patient had oxycodone and alcohol in his UDS, only hydrocodone was prescribed per patient. Dr. Doroteo Bradford did not schedule a follow up and did not really wanted to give him narcotics. The patient will be non narcotic.

## 2012-07-05 ENCOUNTER — Ambulatory Visit (INDEPENDENT_AMBULATORY_CARE_PROVIDER_SITE_OTHER): Payer: No Typology Code available for payment source | Admitting: Sports Medicine

## 2012-07-05 ENCOUNTER — Encounter: Payer: Self-pay | Admitting: Sports Medicine

## 2012-07-05 VITALS — BP 146/92 | HR 78 | Temp 98.6°F | Wt 227.0 lb

## 2012-07-05 DIAGNOSIS — M25562 Pain in left knee: Secondary | ICD-10-CM

## 2012-07-05 DIAGNOSIS — M25569 Pain in unspecified knee: Secondary | ICD-10-CM

## 2012-07-05 DIAGNOSIS — N489 Disorder of penis, unspecified: Secondary | ICD-10-CM

## 2012-07-05 DIAGNOSIS — R21 Rash and other nonspecific skin eruption: Secondary | ICD-10-CM | POA: Insufficient documentation

## 2012-07-05 MED ORDER — CLOTRIMAZOLE-BETAMETHASONE 1-0.05 % EX CREA: TOPICAL_CREAM | Freq: Two times a day (BID) | CUTANEOUS | Status: DC

## 2012-07-05 MED ORDER — TRAMADOL HCL 50 MG PO TABS: 50.0000 mg | ORAL_TABLET | Freq: Three times a day (TID) | ORAL | Status: DC | PRN

## 2012-07-05 NOTE — Assessment & Plan Note (Signed)
History is suggestive of herpetic infection. Penile candidiasis can also appear this way. We'll check HSV antibodies, and use Lotrisone. Return to clinic as needed for this.

## 2012-07-05 NOTE — Progress Notes (Signed)
SPORTS MEDICINE CONSULTATION REPORT  Subjective:    CC: Followup  HPI: Left Knee pain: With a history of intra-articular loose bodies per patient report, no imaging available. He received a single intra-articular injection, that did not provide him with relief. He understands that he needs arthroscopy, but desires oral pain management. He went to pain management, where it was recommended that narcotic pain management may postpone, and mask further intra-articular damage from an intra-articular loose body. It was recommended that he not continue with this. He returns today requesting narcotic pain medication.  He tells [place] that he has an appointment with [facility] orthopedics for February.  Rash on penis: Present for several days, denies any new sexual partners, denies any history of sexually transmitted diseases, notes that the rash began as several vesicles that then ruptured, and crusted over.  They do not itch. He has never had a rash like this before. No fevers, chills, no swollen lymph nodes in the groin.  Past medical history, Surgical history, Family history, Social history, Allergies, and medications have been entered into the medical record, reviewed, and no changes needed.   Review of Systems: No headache, visual changes, nausea, vomiting, diarrhea, constipation, dizziness, abdominal pain, skin rash, fevers, chills, night sweats, weight loss, swollen lymph nodes, body aches, joint swelling, muscle aches, chest pain, or shortness of breath.   Objective:   Vitals:  Afebrile, vital signs stable. General: Well Developed, well nourished, and in no acute distress.  Neuro/Psych: Alert and oriented x3, extra-ocular muscles intact, able to move all 4 extremities.  Skin: Warm and dry, no rashes noted.  Respiratory: Not using accessory muscles, speaking in full sentences, trachea midline.  Cardiovascular: Pulses palpable, no extremity edema. Abdomen: Does not appear distended. Genitalia: There  are several mildly erythematous crusted subcentimeter lesions that are present over the glans, the shaft, and the distal part of the scrotum. This is only present on the left side. There are no satellite lesions, there is no inguinal adenopathy.  Impression and Recommendations:   This case required medical decision making of moderate complexity.

## 2012-07-05 NOTE — Assessment & Plan Note (Addendum)
Has been to pain management, Philip Cuevas in [place], he will not prescribe narcotics.   Philip Cuevas also understands my practice policy of not prescribing chronic narcotics. He is wondering if there is another pain management doctor that can prescribe narcotics until he can see a Careers adviser in February. We will try a referral to Philip Cuevas. Tramadol in the meantime.

## 2012-08-01 ENCOUNTER — Telehealth: Payer: Self-pay | Admitting: *Deleted

## 2012-08-01 MED ORDER — ALPRAZOLAM 0.25 MG PO TABS: 0.2500 mg | ORAL_TABLET | Freq: Every evening | ORAL | Status: DC | PRN

## 2012-08-01 NOTE — Telephone Encounter (Signed)
Will refill at the 0.25 mg dose. Prescription will be in my out box.

## 2012-08-01 NOTE — Telephone Encounter (Signed)
Pt calls and request a refill on his Xanax but states he dose was increased to 0.5mg  rather than the 0.25mg . I dont see where dose was changed. Please advise

## 2012-08-02 NOTE — Telephone Encounter (Signed)
Med faxed   

## 2012-08-05 ENCOUNTER — Telehealth: Payer: Self-pay | Admitting: *Deleted

## 2012-08-05 NOTE — Telephone Encounter (Signed)
Pt states his knee pain is not better and that the Tramadol is not helping. He states he is not sleeping well either due to pain and would like to know what else he can do. 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 Please advise.

## 2012-08-05 NOTE — Telephone Encounter (Signed)
I have nothing stronger, we can try increasing the tramadol to 100 mg 3 times a day, or a topical medication. Let me know which one he would like.

## 2012-08-08 ENCOUNTER — Other Ambulatory Visit: Payer: Self-pay | Admitting: *Deleted

## 2012-08-08 MED ORDER — HYDROXYZINE PAMOATE 50 MG PO CAPS: 50.0000 mg | ORAL_CAPSULE | Freq: Every evening | ORAL | Status: DC | PRN

## 2012-08-08 NOTE — Telephone Encounter (Signed)
Pt is asking if we can give him Ambien to help him sleep. States he was taking it about 2 yrs ago. Please advise.

## 2012-08-08 NOTE — Telephone Encounter (Signed)
We should start with something like Vistaril 50 mg at bedtime. He should also come in to discuss sleep hygiene, and other ways to improve his sleep without a medication if possible. Let me know if you would like me to send in Vistaril.

## 2012-08-08 NOTE — Addendum Note (Signed)
Addended by: Monica Becton on: 08/08/2012 03:20 PM   Modules accepted: Orders

## 2012-08-15 ENCOUNTER — Telehealth: Payer: Self-pay | Admitting: *Deleted

## 2012-08-15 MED ORDER — OLMESARTAN MEDOXOMIL-HCTZ 40-25 MG PO TABS: 1.0000 | ORAL_TABLET | Freq: Every day | ORAL | Status: DC

## 2012-08-15 MED ORDER — AMLODIPINE BESYLATE 5 MG PO TABS: 5.0000 mg | ORAL_TABLET | Freq: Every day | ORAL | Status: DC

## 2012-08-15 NOTE — Telephone Encounter (Signed)
If he's still taking the 40/12.5 mg dose, then this is an overdose for the olmesartan portion. I'm going to increase to Benicar 40/25 mg. I'm also going to add amlodipine 5 mg daily. Followup in 2-3 weeks to recheck.

## 2012-08-15 NOTE — Telephone Encounter (Signed)
Pt informed and will pickup new medication and schedule appt.

## 2012-08-15 NOTE — Telephone Encounter (Signed)
Pt states he has been taking the Benicar BID and states his BP is still running high. He would like to know if we can add another medication or be put on something else to see if we can get it down. Please advise.

## 2012-08-25 ENCOUNTER — Ambulatory Visit (INDEPENDENT_AMBULATORY_CARE_PROVIDER_SITE_OTHER): Payer: No Typology Code available for payment source

## 2012-08-25 ENCOUNTER — Ambulatory Visit (INDEPENDENT_AMBULATORY_CARE_PROVIDER_SITE_OTHER): Payer: No Typology Code available for payment source | Admitting: Sports Medicine

## 2012-08-25 ENCOUNTER — Encounter: Payer: Self-pay | Admitting: Sports Medicine

## 2012-08-25 VITALS — BP 129/75 | HR 83 | Wt 230.0 lb

## 2012-08-25 DIAGNOSIS — M25529 Pain in unspecified elbow: Secondary | ICD-10-CM

## 2012-08-25 DIAGNOSIS — M25569 Pain in unspecified knee: Secondary | ICD-10-CM

## 2012-08-25 DIAGNOSIS — W19XXXA Unspecified fall, initial encounter: Secondary | ICD-10-CM

## 2012-08-25 DIAGNOSIS — M25561 Pain in right knee: Secondary | ICD-10-CM

## 2012-08-25 DIAGNOSIS — M546 Pain in thoracic spine: Secondary | ICD-10-CM

## 2012-08-25 DIAGNOSIS — F411 Generalized anxiety disorder: Secondary | ICD-10-CM

## 2012-08-25 DIAGNOSIS — M25522 Pain in left elbow: Secondary | ICD-10-CM | POA: Insufficient documentation

## 2012-08-25 DIAGNOSIS — G4700 Insomnia, unspecified: Secondary | ICD-10-CM

## 2012-08-25 MED ORDER — CYCLOBENZAPRINE HCL 10 MG PO TABS: ORAL_TABLET | ORAL | Status: DC

## 2012-08-25 MED ORDER — OXYCODONE-ACETAMINOPHEN 5-325 MG PO TABS: 1.0000 | ORAL_TABLET | Freq: Three times a day (TID) | ORAL | Status: DC | PRN

## 2012-08-25 MED ORDER — ALPRAZOLAM 0.5 MG PO TABS: 0.5000 mg | ORAL_TABLET | Freq: Every evening | ORAL | Status: DC | PRN

## 2012-08-25 NOTE — Assessment & Plan Note (Signed)
S/p fall. Exam benign. Strapped with Ace bandage. We will x-ray. Return to see me if not better in 2-3 weeks.

## 2012-08-25 NOTE — Progress Notes (Signed)
Subjective:    CC: Fall  HPI: Philip Cuevas was playing laser tag with his children, unfortunately he tripped and fell impacting his left elbow, twisting his back, and hurting his right knee.  Back: Painful over the parathoracic muscles, no radiation, hasn't tried any medications for this.  Right knee: Painful over the both joint lines, no swelling, no mechanical symptoms.  Left elbow: Painful anteriorly, maintains excellent range of motion, no swelling.  Past medical history, Surgical history, Family history, Social history, Allergies, and medications have been entered into the medical record, reviewed, and no changes needed.   Review of Systems: No fevers, chills, night sweats, weight loss, chest pain, or shortness of breath.   Objective:    General: Well Developed, well nourished, and in no acute distress.  Neuro: Alert and oriented x3, extra-ocular muscles intact.  HEENT: Normocephalic, atraumatic, pupils equal round reactive to light, neck supple, no masses, no lymphadenopathy, thyroid nonpalpable.  Skin: Warm and dry, no rashes. Cardiac: Regular rate and rhythm, no murmurs rubs or gallops.  Respiratory: Clear to auscultation bilaterally. Not using accessory muscles, speaking in full sentences. Back Exam:  Inspection: Unremarkable  Motion: Flexion 45 deg, Extension 45 deg, Side Bending to 45 deg bilaterally,  Rotation to 45 deg bilaterally  SLR laying: Negative  XSLR laying: Negative  Palpable tenderness: Bilateral parathoracic muscles. FABER: negative. Sensory change: Gross sensation intact to all lumbar and sacral dermatomes.  Reflexes: 2+ at both patellar tendons, 2+ at achilles tendons, Babinski's downgoing.  Strength at foot  Plantar-flexion: 5/5 Dorsi-flexion: 5/5 Eversion: 5/5 Inversion: 5/5  Leg strength  Quad: 5/5 Hamstring: 5/5 Hip flexor: 5/5 Hip abductors: 5/5  Gait unremarkable.  Right Knee: Normal to inspection with no erythema or effusion or obvious bony  abnormalities. Palpation normal with no warmth, joint line tenderness, patellar tenderness, or condyle tenderness. ROM full in flexion and extension and lower leg rotation. Ligaments with solid consistent endpoints including ACL, PCL, LCL, MCL. Negative Mcmurray's, Apley's, and Thessalonian tests. Non painful patellar compression. Patellar glide without crepitus. Patellar and quadriceps tendons unremarkable. Hamstring and quadriceps strength is normal.   Left Elbow: Unremarkable to inspection. Range of motion full pronation, supination, flexion, extension. Strength is full to all of the above directions Stable to varus, valgus stress. Negative moving valgus stress test. No discrete areas of tenderness to palpation. Ulnar nerve does not sublux. Negative cubital tunnel Tinel's.  X-rays of his right knee and left elbow are negative for fracture, dislocation, or degenerative change.  Impression and Recommendations:

## 2012-08-25 NOTE — Assessment & Plan Note (Signed)
Has been well controlled in the past with each bedtime Xanax. He did refill. I am okay refilling 0.5 mg #30 per month.

## 2012-08-25 NOTE — Assessment & Plan Note (Signed)
S/p fall. Exam benign. Strap with Ace bandage. We will x-ray. Return to see me if not better in 2-3 weeks.

## 2012-08-25 NOTE — Assessment & Plan Note (Signed)
Parathoracic spasm status post fall. Short course of Percocet, he understands that this is one time only. Flexeril. He needs to followup with Dr. Oneal Grout with pain management for further narcotics. Heat to relax muscles. May discontinue tramadol.

## 2012-09-06 ENCOUNTER — Ambulatory Visit (INDEPENDENT_AMBULATORY_CARE_PROVIDER_SITE_OTHER): Payer: No Typology Code available for payment source | Admitting: Family Medicine

## 2012-09-06 ENCOUNTER — Encounter: Payer: Self-pay | Admitting: Family Medicine

## 2012-09-06 VITALS — BP 128/71 | HR 95 | Temp 98.5°F | Wt 234.0 lb

## 2012-09-06 DIAGNOSIS — R05 Cough: Secondary | ICD-10-CM

## 2012-09-06 DIAGNOSIS — R5383 Other fatigue: Secondary | ICD-10-CM

## 2012-09-06 DIAGNOSIS — R0602 Shortness of breath: Secondary | ICD-10-CM

## 2012-09-06 DIAGNOSIS — J111 Influenza due to unidentified influenza virus with other respiratory manifestations: Secondary | ICD-10-CM

## 2012-09-06 MED ORDER — OSELTAMIVIR PHOSPHATE 75 MG PO CAPS: 75.0000 mg | ORAL_CAPSULE | Freq: Two times a day (BID) | ORAL | Status: DC

## 2012-09-06 MED ORDER — AZITHROMYCIN 250 MG PO TABS: ORAL_TABLET | ORAL | Status: AC

## 2012-09-06 MED ORDER — HYDROCODONE-HOMATROPINE 5-1.5 MG/5ML PO SYRP: 5.0000 mL | ORAL_SOLUTION | Freq: Three times a day (TID) | ORAL | Status: DC | PRN

## 2012-09-06 NOTE — Progress Notes (Signed)
CC: Philip Cuevas is a 32 y.o. male is here for Cough   Subjective: HPI:  Philip Cuevas presents for fever last 36 hours accompanied by cough, mild shortness of breath, muscle aches, fatigue. Symptoms came on suddenly. Have gotten no better no worse since onset. Described as moderate in severity. Present 24 hours a day, coughing interfering with sleep. Has tried over-the-counter Alka-Seltzer without any improvement, nothing else makes overall constellation of symptoms better or worse. Denies confusion, wheezing, chest pain, joint pain, rashes, GI disturbance, nausea, vomiting, diarrhea   Review Of Systems Outlined In HPI  Past Medical History  Diagnosis Date  . Knee injury   . Anxiety   . Depression   . Asthma     mild, intermittent  . Hypertension      Family History  Problem Relation Age of Onset  . Hypertension Mother   . Hyperlipidemia Mother   . Depression Father     committed suicide     History  Substance Use Topics  . Smoking status: Current Some Day Smoker -- 0.3 packs/day for 4 years    Types: Cigarettes    Last Attempt to Quit: 10/22/2005  . Smokeless tobacco: Not on file  . Alcohol Use: No     Objective: Filed Vitals:   09/06/12 1356  BP: 128/71  Pulse: 95  Temp: 98.5 F (36.9 C)    General: Alert and Oriented, No Acute Distress somewhat fatigued HEENT: Pupils equal, round, reactive to light. Conjunctivae clear.  External ears unremarkable, canals clear with intact TMs with appropriate landmarks.  Middle ear appears open without effusion. Pink inferior turbinates.  Moist mucous membranes, pharynx without inflammation nor lesions.  Neck supple without palpable lymphadenopathy nor abnormal masses. Lungs: Comfortable work of breathing, moderate diffuse rhonchi without signs of consolidation. No wheezing no rales no accessory muscle use  Cardiac: Regular rate and rhythm. Normal S1/S2.  No murmurs, rubs, nor gallops.   Abdomen: Normal bowel sounds, soft and  non tender without palpable masses. Extremities: No peripheral edema.  Strong peripheral pulses.  Mental Status: No depression, anxiety, nor agitation. Skin: Warm and dry.  Assessment & Plan: Philip Cuevas was seen today for cough.  Diagnoses and associated orders for this visit:  Influenza - oseltamivir (TAMIFLU) 75 MG capsule; Take 1 capsule (75 mg total) by mouth 2 (two) times daily. - azithromycin (ZITHROMAX) 250 MG tablet; Take two tabs at once on day 1, then one tab daily on days 2-5.  Cough - HYDROcodone-homatropine (HYCODAN) 5-1.5 MG/5ML syrup; Take 5 mLs by mouth every 8 (eight) hours as needed for cough.    History highly suspicious for seasonal influenza, start Tamiflu, start azithromycin given history of asthma and lung exam in hopes of taking advantage of anti-inflammatory and antimicrobial property. Return if not improving in 48 hours  Return if symptoms worsen or fail to improve.

## 2012-09-07 ENCOUNTER — Telehealth: Payer: Self-pay | Admitting: *Deleted

## 2012-09-07 MED ORDER — GUAIFENESIN-CODEINE 100-10 MG/5ML PO SYRP: 5.0000 mL | ORAL_SOLUTION | Freq: Three times a day (TID) | ORAL | Status: DC | PRN

## 2012-09-07 NOTE — Telephone Encounter (Signed)
Already addressed

## 2012-09-07 NOTE — Telephone Encounter (Signed)
The cough syrup rx'ed the other day makes patients heart beat faster. Is there anything else that can be called into [facility] on [address] in [place]?

## 2012-09-07 NOTE — Telephone Encounter (Signed)
Pt calls and states that the cough med prescribed to him yesterday when he took it caused his heart to race and wants to know if he can get a different one sent to his pharmacy

## 2012-09-07 NOTE — Telephone Encounter (Signed)
rx faxed

## 2012-09-07 NOTE — Telephone Encounter (Signed)
Philip Cuevas, New cough syrup rx [placed in your inbox.

## 2012-09-26 ENCOUNTER — Telehealth: Payer: Self-pay | Admitting: *Deleted

## 2012-09-26 MED ORDER — TADALAFIL 20 MG PO TABS: 20.0000 mg | ORAL_TABLET | Freq: Every day | ORAL | Status: DC | PRN

## 2012-09-26 NOTE — Telephone Encounter (Signed)
Pt calls and request a refill on Cialis 20mg . Do not see on his med list.

## 2012-09-26 NOTE — Telephone Encounter (Signed)
Rx in my outbox, didn't see it either on the med list but see it discussed in some of Dr. Ovidio Kin notes.

## 2012-10-10 ENCOUNTER — Telehealth: Payer: Self-pay

## 2012-10-10 DIAGNOSIS — F411 Generalized anxiety disorder: Secondary | ICD-10-CM

## 2012-10-10 MED ORDER — TRAMADOL HCL 50 MG PO TABS
50.0000 mg | ORAL_TABLET | Freq: Three times a day (TID) | ORAL | Status: DC | PRN
Start: 2012-10-10 — End: 2013-06-06

## 2012-10-10 MED ORDER — ALPRAZOLAM 0.5 MG PO TABS: 0.5000 mg | ORAL_TABLET | Freq: Every evening | ORAL | Status: DC | PRN

## 2012-10-10 NOTE — Telephone Encounter (Signed)
Caydence would like a refill on Tramadol and xanax is this appropriate?

## 2012-10-10 NOTE — Telephone Encounter (Signed)
Yes, rx in my outbox.

## 2012-11-04 ENCOUNTER — Other Ambulatory Visit: Payer: Self-pay | Admitting: Sports Medicine

## 2012-11-07 ENCOUNTER — Other Ambulatory Visit: Payer: Self-pay | Admitting: Sports Medicine

## 2012-11-08 ENCOUNTER — Other Ambulatory Visit: Payer: Self-pay | Admitting: Sports Medicine

## 2013-01-26 ENCOUNTER — Ambulatory Visit (INDEPENDENT_AMBULATORY_CARE_PROVIDER_SITE_OTHER): Payer: 59 | Admitting: Family Medicine

## 2013-01-26 ENCOUNTER — Encounter: Payer: Self-pay | Admitting: Family Medicine

## 2013-01-26 VITALS — BP 146/90 | HR 104 | Temp 98.4°F | Wt 230.0 lb

## 2013-01-26 DIAGNOSIS — J209 Acute bronchitis, unspecified: Secondary | ICD-10-CM

## 2013-01-26 DIAGNOSIS — J029 Acute pharyngitis, unspecified: Secondary | ICD-10-CM

## 2013-01-26 MED ORDER — BENZONATATE 100 MG PO CAPS: 100.0000 mg | ORAL_CAPSULE | Freq: Three times a day (TID) | ORAL | Status: DC | PRN

## 2013-01-26 MED ORDER — AZITHROMYCIN 250 MG PO TABS: ORAL_TABLET | ORAL | Status: DC

## 2013-01-26 MED ORDER — OXYCODONE-ACETAMINOPHEN 5-325 MG PO TABS: 1.0000 | ORAL_TABLET | ORAL | Status: DC | PRN

## 2013-01-26 NOTE — Progress Notes (Signed)
CC: Philip Cuevas is a 32 y.o. male is here for Sore Throat   Subjective: HPI: Patient complains of cough and sore throat. Symptoms came on abruptly Monday, 2 days after returning from [place]. They've been worsen on a daily basis. Cough is productive worse at night keeping him awake, without blood and sputum. Sore throat is moderate in severity described as hot rocks low in my throat worse with coughing, mild improvement with a ibuprofen and tramadol.  Mild shortness of breath without chest pain or new back pain. Symptoms are present all hours of the day. He denies fevers but did feel chills this morning. He denies wheezing, orthopnea, irregular heartbeat, confusion, headache, rashes, joint pain, nor GI disturbance    Review Of Systems Outlined In HPI  Past Medical History  Diagnosis Date  . Knee injury   . Anxiety   . Depression   . Asthma     mild, intermittent  . Hypertension      Family History  Problem Relation Age of Onset  . Hypertension Mother   . Hyperlipidemia Mother   . Depression Father     committed suicide     History  Substance Use Topics  . Smoking status: Current Some Day Smoker -- 0.30 packs/day for 4 years    Types: Cigarettes    Last Attempt to Quit: 10/22/2005  . Smokeless tobacco: Not on file  . Alcohol Use: No     Objective: Filed Vitals:   01/26/13 1020  BP: 146/90  Pulse: 104  Temp: 98.4 F (36.9 C)    General: Alert and Oriented, No Acute Distress HEENT: Pupils equal, round, reactive to light. Conjunctivae clear.  External ears unremarkable, canals clear with intact TMs with appropriate landmarks.  Middle ear appears open without effusion. Pink inferior turbinates.  Moist mucous membranes, pharynx with midline uvula and mild erythema without lesions.  Neck supple without palpable lymphadenopathy nor abnormal masses. Lungs: C comfortable work of breathing with mild central rhonchi without wheezing or rales or signs of  consolidation Cardiac: Regular rate and rhythm. Normal S1/S2.  No murmurs, rubs, nor gallops.   Extremities: No peripheral edema.  Strong peripheral pulses.  Mental Status: No depression, anxiety, nor agitation. Skin: Warm and dry.  Assessment & Plan: Philip Cuevas was seen today for sore throat.  Diagnoses and associated orders for this visit:  Acute pharyngitis - POCT rapid strep A  Acute bronchitis - azithromycin (ZITHROMAX) 250 MG tablet; Take two tabs at once on day 1, then one tab daily on days 2-5. - benzonatate (TESSALON PERLES) 100 MG capsule; Take 1 capsule (100 mg total) by mouth 3 (three) times daily as needed for cough. - oxyCODONE-acetaminophen (PERCOCET/ROXICET) 5-325 MG per tablet; Take 1 tablet by mouth every 4 (four) hours as needed for pain.    Acute pharyngitis with acute bronchitis: Start azithromycin, he has had an intolerance to Hycodan therefore may use oxycodone along with Tessalon pearls to help with sleep.Signs and symptoms requring emergent/urgent reevaluation were discussed with the patient.  Return if symptoms worsen or fail to improve.

## 2013-01-30 ENCOUNTER — Telehealth: Payer: Self-pay

## 2013-01-30 DIAGNOSIS — J029 Acute pharyngitis, unspecified: Secondary | ICD-10-CM

## 2013-01-30 DIAGNOSIS — J209 Acute bronchitis, unspecified: Secondary | ICD-10-CM

## 2013-01-30 MED ORDER — PREDNISONE 20 MG PO TABS: ORAL_TABLET | ORAL | Status: DC

## 2013-01-30 MED ORDER — LEVOFLOXACIN 500 MG PO TABS: 500.0000 mg | ORAL_TABLET | Freq: Every day | ORAL | Status: DC

## 2013-01-30 MED ORDER — OXYCODONE-ACETAMINOPHEN 5-325 MG PO TABS: 1.0000 | ORAL_TABLET | Freq: Three times a day (TID) | ORAL | Status: DC

## 2013-01-30 NOTE — Telephone Encounter (Signed)
Philip Cuevas called and left a message stating he still has chest congestion and sore throat. He asked what the next step was and could he have a refill of the pain medication for his throat?

## 2013-01-30 NOTE — Telephone Encounter (Signed)
Patient advised and prescription is up front.

## 2013-01-30 NOTE — Telephone Encounter (Signed)
Andrea/Angela Stronger antiboitic and prednisone burst has been sent to [facility].  Will you please fax over the percocet Rx.  If no improvement by Wednesday needs re-evaluation.

## 2013-01-31 ENCOUNTER — Telehealth: Payer: Self-pay | Admitting: *Deleted

## 2013-01-31 NOTE — Telephone Encounter (Signed)
Pt has called requesting refill on Xanax and BP med. We have never seen him for his BP even though we are his PCP. Please advise if ok to fill for 1 month and tell pt he needs to come in for HTN?

## 2013-02-01 MED ORDER — OLMESARTAN MEDOXOMIL-HCTZ 40-25 MG PO TABS: 1.0000 | ORAL_TABLET | Freq: Every day | ORAL | Status: DC

## 2013-02-01 MED ORDER — ALPRAZOLAM 0.5 MG PO TABS: 0.5000 mg | ORAL_TABLET | Freq: Every evening | ORAL | Status: DC | PRN

## 2013-02-01 MED ORDER — AMLODIPINE BESYLATE 5 MG PO TABS: 5.0000 mg | ORAL_TABLET | Freq: Every day | ORAL | Status: DC

## 2013-02-01 NOTE — Telephone Encounter (Signed)
Pt informed and xanax faxed to [facility].  Meyer Cory, LPN

## 2013-02-01 NOTE — Telephone Encounter (Signed)
I will refill both, prescription will be in outbox for Xanax.

## 2013-03-10 ENCOUNTER — Other Ambulatory Visit: Payer: Self-pay | Admitting: Sports Medicine

## 2013-04-06 ENCOUNTER — Other Ambulatory Visit: Payer: Self-pay | Admitting: Sports Medicine

## 2013-04-06 ENCOUNTER — Other Ambulatory Visit: Payer: Self-pay

## 2013-04-06 MED ORDER — ALPRAZOLAM 0.5 MG PO TABS: 0.5000 mg | ORAL_TABLET | Freq: Every evening | ORAL | Status: DC | PRN

## 2013-06-06 ENCOUNTER — Other Ambulatory Visit: Payer: Self-pay | Admitting: Sports Medicine

## 2013-06-06 MED ORDER — TRAMADOL HCL 50 MG PO TABS: 50.0000 mg | ORAL_TABLET | Freq: Three times a day (TID) | ORAL | Status: DC | PRN

## 2013-06-06 MED ORDER — ALPRAZOLAM 0.5 MG PO TABS: 0.5000 mg | ORAL_TABLET | Freq: Every evening | ORAL | Status: DC | PRN

## 2013-06-07 ENCOUNTER — Other Ambulatory Visit: Payer: Self-pay

## 2013-06-08 ENCOUNTER — Ambulatory Visit: Payer: 59 | Admitting: Sports Medicine

## 2013-06-09 ENCOUNTER — Telehealth: Payer: Self-pay

## 2013-06-09 NOTE — Telephone Encounter (Signed)
ERROR

## 2013-06-29 ENCOUNTER — Other Ambulatory Visit: Payer: Self-pay

## 2013-07-26 ENCOUNTER — Other Ambulatory Visit: Payer: Self-pay

## 2013-07-26 ENCOUNTER — Other Ambulatory Visit: Payer: Self-pay | Admitting: Sports Medicine

## 2013-07-26 MED ORDER — ALPRAZOLAM 0.5 MG PO TABS: 0.5000 mg | ORAL_TABLET | Freq: Every evening | ORAL | Status: DC | PRN

## 2013-08-03 ENCOUNTER — Other Ambulatory Visit: Payer: Self-pay | Admitting: Sports Medicine

## 2013-08-09 ENCOUNTER — Encounter: Payer: Self-pay | Admitting: Family Medicine

## 2013-08-09 ENCOUNTER — Ambulatory Visit (INDEPENDENT_AMBULATORY_CARE_PROVIDER_SITE_OTHER): Payer: 59 | Admitting: Family Medicine

## 2013-08-09 VITALS — BP 139/80 | HR 93 | Wt 233.0 lb

## 2013-08-09 DIAGNOSIS — M546 Pain in thoracic spine: Secondary | ICD-10-CM

## 2013-08-09 DIAGNOSIS — S39012A Strain of muscle, fascia and tendon of lower back, initial encounter: Secondary | ICD-10-CM

## 2013-08-09 MED ORDER — OXYCODONE-ACETAMINOPHEN 10-325 MG PO TABS: 1.0000 | ORAL_TABLET | Freq: Every evening | ORAL | Status: DC | PRN

## 2013-08-09 MED ORDER — CYCLOBENZAPRINE HCL 10 MG PO TABS: ORAL_TABLET | ORAL | Status: DC

## 2013-08-09 NOTE — Progress Notes (Signed)
CC: Philip Cuevas is a 32 y.o. male is here for Back Pain   Subjective: HPI:  Complains of low back pain that has been present since yesterday was mild soon after he missed a step carrying a heavy computer at 1 PM however by 5 PM in the evening it has slowly reached moderate to severe in severity. He describes it as a tugging sensation and tightness. He was unable to sleep whatsoever last night due to the discomfort area it is localized in the left low back without radiation. It is worse with extension slightly worse with flexion, worse with rotation to the left.  Denies midline back pain, saddle paresthesia, bowel or bladder incontinence nor leg weakness.  Symptoms have not be getting better or worse since late last night.  He is leaving for a holiday cruise tomorrow morning and will be gone for 10 days  Review Of Systems Outlined In HPI  Past Medical History  Diagnosis Date  . Knee injury   . Anxiety   . Depression   . Asthma     mild, intermittent  . Hypertension      Family History  Problem Relation Age of Onset  . Hypertension Mother   . Hyperlipidemia Mother   . Depression Father     committed suicide     History  Substance Use Topics  . Smoking status: Current Some Day Smoker -- 0.30 packs/day for 4 years    Types: Cigarettes    Last Attempt to Quit: 10/22/2005  . Smokeless tobacco: Not on file  . Alcohol Use: No     Objective: Filed Vitals:   08/09/13 1529  BP: 139/80  Pulse: 93    General: Alert and Oriented, No Acute Distress HEENT: Pupils equal, round, reactive to light. Conjunctivae clear.  Moist mucous membranes Lungs: Clear to auscultation bilaterally, no wheezing/ronchi/rales.  Comfortable work of breathing. Good air movement. Cardiac: Regular rate and rhythm. Normal S1/S2.  No murmurs, rubs, nor gallops.   Back: No midline spinous process tenderness in the lumbar region, pain is reproduced with paraspinal musculature palpation from L2-L4. No pain  over SI joint bilaterally. Full range of motion strength in the lumbar spine. Stork test negative Extremities: No peripheral edema.  Strong peripheral pulses. Full range of motion strength of both lower extremities L4 and S1 DTRs two over four bilaterally Mental Status: No depression, anxiety, nor agitation. Skin: Warm and dry.  Assessment & Plan: Philip Cuevas was seen today for back pain.  Diagnoses and associated orders for this visit:  Thoracic back pain - cyclobenzaprine (FLEXERIL) 10 MG tablet; One half tab PO qHS, then increase gradually to one tab TID.  Back strain, initial encounter  Other Orders - oxyCODONE-acetaminophen (PERCOCET) 10-325 MG per tablet; Take 1 tablet by mouth at bedtime as needed for pain.    Low back strain: Start scheduled cyclobenzaprine and Mobic which he already has at home.  Percocet only for helping with sleep, this is not going to be prescribed beyond this prescription for his back.  Home exercise regimen was provided and he was encouraged to engage in this on a nightly basis while vacationing and for a week after he returns.  Please disregard thoracic back pain diagnosis above, incorrectly attributed to today's Flexeril prescription  Return if symptoms worsen or fail to improve.

## 2013-09-06 ENCOUNTER — Telehealth: Payer: Self-pay

## 2013-09-06 ENCOUNTER — Other Ambulatory Visit: Payer: Self-pay | Admitting: Sports Medicine

## 2013-09-06 MED ORDER — ALPRAZOLAM 0.5 MG PO TABS: 0.5000 mg | ORAL_TABLET | Freq: Every evening | ORAL | Status: DC | PRN

## 2013-09-06 NOTE — Telephone Encounter (Signed)
Patient called request refill for Ambien add Xanax patient was advised that he had npt been seen for this since 08/25/2012 and he needed to schedule an appt for refills. Patient was transfered to front office to schedule a follow up appt. Rhonda Cunningham,CMA

## 2013-09-07 ENCOUNTER — Ambulatory Visit (INDEPENDENT_AMBULATORY_CARE_PROVIDER_SITE_OTHER): Payer: 59 | Admitting: Sports Medicine

## 2013-09-07 ENCOUNTER — Encounter: Payer: Self-pay | Admitting: Sports Medicine

## 2013-09-07 VITALS — BP 122/68 | HR 89 | Wt 228.0 lb

## 2013-09-07 DIAGNOSIS — I10 Essential (primary) hypertension: Secondary | ICD-10-CM

## 2013-09-07 DIAGNOSIS — G43909 Migraine, unspecified, not intractable, without status migrainosus: Secondary | ICD-10-CM

## 2013-09-07 DIAGNOSIS — G4700 Insomnia, unspecified: Secondary | ICD-10-CM

## 2013-09-07 DIAGNOSIS — F411 Generalized anxiety disorder: Secondary | ICD-10-CM

## 2013-09-07 DIAGNOSIS — J45990 Exercise induced bronchospasm: Secondary | ICD-10-CM

## 2013-09-07 DIAGNOSIS — F528 Other sexual dysfunction not due to a substance or known physiological condition: Secondary | ICD-10-CM

## 2013-09-07 MED ORDER — BUSPIRONE HCL 15 MG PO TABS: ORAL_TABLET | ORAL | Status: DC

## 2013-09-07 MED ORDER — ZOLPIDEM TARTRATE 10 MG PO TABS: 10.0000 mg | ORAL_TABLET | Freq: Every evening | ORAL | Status: DC | PRN

## 2013-09-07 NOTE — Assessment & Plan Note (Signed)
Continue Xanax. Adding BuSpar.

## 2013-09-07 NOTE — Assessment & Plan Note (Signed)
Well-controlled on current medications 

## 2013-09-07 NOTE — Progress Notes (Signed)
  Subjective:    CC: Followup  HPI: Philip Cuevas returns to discuss anxiety: He recalls doing fairly well on an SSRI however this made him without worry to extend she was uncomfortable with. He is since been doing well on a low dose of alprazolam. He wonders if there is another controller medicine that may help. He did ask to go up on the Xanax dose.  Insomnia : was well controlled with Ambien in the past, desires a refill.  Hypertension: Well controlled.  Past medical history, Surgical history, Family history not pertinant except as noted below, Social history, Allergies, and medications have been entered into the medical record, reviewed, and no changes needed.   Review of Systems: No fevers, chills, night sweats, weight loss, chest pain, or shortness of breath.   Objective:    General: Well Developed, well nourished, and in no acute distress.  Neuro: Alert and oriented x3, extra-ocular muscles intact, sensation grossly intact.  HEENT: Normocephalic, atraumatic, pupils equal round reactive to light, neck supple, no masses, no lymphadenopathy, thyroid nonpalpable.  Skin: Warm and dry, no rashes. Cardiac: Regular rate and rhythm, no murmurs rubs or gallops, no lower extremity edema.  Respiratory: Clear to auscultation bilaterally. Not using accessory muscles, speaking in full sentences.  Impression and Recommendations:

## 2013-09-07 NOTE — Assessment & Plan Note (Signed)
Adding Ambien.

## 2013-09-08 ENCOUNTER — Encounter (HOSPITAL_COMMUNITY): Payer: Self-pay | Admitting: Emergency Medicine

## 2013-09-08 ENCOUNTER — Emergency Department (HOSPITAL_COMMUNITY): Payer: 59

## 2013-09-08 ENCOUNTER — Emergency Department (HOSPITAL_COMMUNITY)
Admission: EM | Admit: 2013-09-08 | Discharge: 2013-09-08 | Disposition: A | Discharge status: Home / Self Care | Payer: 59 | Attending: Emergency Medicine | Admitting: Emergency Medicine

## 2013-09-08 DIAGNOSIS — F329 Major depressive disorder, single episode, unspecified: Secondary | ICD-10-CM | POA: Insufficient documentation

## 2013-09-08 DIAGNOSIS — Z79899 Other long term (current) drug therapy: Secondary | ICD-10-CM | POA: Insufficient documentation

## 2013-09-08 DIAGNOSIS — S161XXA Strain of muscle, fascia and tendon at neck level, initial encounter: Secondary | ICD-10-CM

## 2013-09-08 DIAGNOSIS — F411 Generalized anxiety disorder: Secondary | ICD-10-CM | POA: Insufficient documentation

## 2013-09-08 DIAGNOSIS — Y92410 Unspecified street and highway as the place of occurrence of the external cause: Secondary | ICD-10-CM | POA: Insufficient documentation

## 2013-09-08 DIAGNOSIS — Y9389 Activity, other specified: Secondary | ICD-10-CM | POA: Insufficient documentation

## 2013-09-08 DIAGNOSIS — J45909 Unspecified asthma, uncomplicated: Secondary | ICD-10-CM | POA: Insufficient documentation

## 2013-09-08 DIAGNOSIS — S139XXA Sprain of joints and ligaments of unspecified parts of neck, initial encounter: Secondary | ICD-10-CM | POA: Insufficient documentation

## 2013-09-08 DIAGNOSIS — F3289 Other specified depressive episodes: Secondary | ICD-10-CM | POA: Insufficient documentation

## 2013-09-08 DIAGNOSIS — F17200 Nicotine dependence, unspecified, uncomplicated: Secondary | ICD-10-CM | POA: Insufficient documentation

## 2013-09-08 DIAGNOSIS — I10 Essential (primary) hypertension: Secondary | ICD-10-CM | POA: Insufficient documentation

## 2013-09-08 DIAGNOSIS — Z9089 Acquired absence of other organs: Secondary | ICD-10-CM | POA: Insufficient documentation

## 2013-09-08 MED ORDER — MORPHINE SULFATE 4 MG/ML IJ SOLN
4.0000 mg | Freq: Once | INTRAMUSCULAR | Status: AC
  Administered 2013-09-08: 4 mg via INTRAMUSCULAR
  Filled 2013-09-08: qty 1

## 2013-09-08 MED ORDER — DIAZEPAM 5 MG/ML IJ SOLN
5.0000 mg | Freq: Once | INTRAMUSCULAR | Status: AC
  Administered 2013-09-08: 5 mg via INTRAMUSCULAR
  Filled 2013-09-08: qty 2

## 2013-09-08 MED ORDER — HYDROCODONE-ACETAMINOPHEN 5-325 MG PO TABS: 2.0000 | ORAL_TABLET | Freq: Four times a day (QID) | ORAL | Status: DC | PRN

## 2013-09-08 MED ORDER — KETOROLAC TROMETHAMINE 60 MG/2ML IM SOLN
60.0000 mg | Freq: Once | INTRAMUSCULAR | Status: AC
  Administered 2013-09-08: 60 mg via INTRAMUSCULAR
  Filled 2013-09-08: qty 2

## 2013-09-08 MED ORDER — DIAZEPAM 5 MG/ML IJ SOLN: 5.0000 mg | Freq: Once | INTRAMUSCULAR | Status: DC
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
## 2013-09-08 NOTE — Discharge Instructions (Signed)
Take motrin 600 mg every 6 hrs for pain.   Take vicodin as prescribed for severe pain. Do NOT drive with it.   Follow up with your doctor. If you have severe neck pain for 2 weeks, then see your doctor to get MRI.   Return to ER if you have severe pain, vomiting, worse neck pain, numbness, weakness.
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
## 2013-09-14 ENCOUNTER — Other Ambulatory Visit: Payer: Self-pay | Admitting: Sports Medicine

## 2013-09-14 ENCOUNTER — Encounter: Payer: Self-pay | Admitting: Sports Medicine

## 2013-09-14 ENCOUNTER — Ambulatory Visit (INDEPENDENT_AMBULATORY_CARE_PROVIDER_SITE_OTHER): Payer: 59 | Admitting: Sports Medicine

## 2013-09-14 ENCOUNTER — Ambulatory Visit (INDEPENDENT_AMBULATORY_CARE_PROVIDER_SITE_OTHER): Payer: 59

## 2013-09-14 VITALS — BP 129/72 | HR 84 | Ht 71.0 in | Wt 229.0 lb

## 2013-09-14 DIAGNOSIS — M542 Cervicalgia: Secondary | ICD-10-CM

## 2013-09-14 DIAGNOSIS — S134XXA Sprain of ligaments of cervical spine, initial encounter: Secondary | ICD-10-CM | POA: Insufficient documentation

## 2013-09-14 DIAGNOSIS — M549 Dorsalgia, unspecified: Secondary | ICD-10-CM

## 2013-09-14 DIAGNOSIS — M546 Pain in thoracic spine: Secondary | ICD-10-CM

## 2013-09-14 DIAGNOSIS — S139XXA Sprain of joints and ligaments of unspecified parts of neck, initial encounter: Secondary | ICD-10-CM

## 2013-09-14 MED ORDER — CYCLOBENZAPRINE HCL 10 MG PO TABS: 10.0000 mg | ORAL_TABLET | Freq: Three times a day (TID) | ORAL | Status: DC | PRN

## 2013-09-14 MED ORDER — GABAPENTIN 300 MG PO CAPS: ORAL_CAPSULE | ORAL | Status: DC

## 2013-09-14 NOTE — Progress Notes (Signed)
   Subjective:    I'm seeing this patient as a consultation for:  Dr. Chaney Mallingavid Yao [facility].  CC: Back and neck pain  HPI: One week ago this 33 year old male was reviewed, he went to the emergency department where he received several doses of narcotics, as well as x-rays of the cervical spine are negative. He tells me he continues to have pain he localizes along the cervical spine and both of her shoulders, as well as the low back and both bilateral buttock without radiation down the leg. Pain is moderate, persistent. He tells me he has a good response to tramadol and Flexeril, he does need a refill of Flexeril. No bowel or bladder dysfunction, no constitutional symptoms.  Past medical history, Surgical history, Family history not pertinant except as noted below, Social history, Allergies, and medications have been entered into the medical record, reviewed, and no changes needed.   Review of Systems: No headache, visual changes, nausea, vomiting, diarrhea, constipation, dizziness, abdominal pain, skin rash, fevers, chills, night sweats, weight loss, swollen lymph nodes, body aches, joint swelling, muscle aches, chest pain, shortness of breath, mood changes, visual or auditory hallucinations.   Objective:   General: Well Developed, well nourished, and in no acute distress.  Neuro/Psych: Alert and oriented x3, extra-ocular muscles intact, able to move all 4 extremities, sensation grossly intact. Skin: Warm and dry, no rashes noted.  Respiratory: Not using accessory muscles, speaking in full sentences, trachea midline.  Cardiovascular: Pulses palpable, no extremity edema. Abdomen: Does not appear distended. Neck: Inspection unremarkable. No palpable stepoffs, diffusely sore along the paracervical muscles. Negative Spurling's maneuver. Full neck range of motion Grip strength and sensation normal in bilateral hands Strength good C4 to T1 distribution No sensory change  to C4 to T1 Negative Hoffman sign bilaterally Reflexes normal Back Exam:  Inspection: Unremarkable  Motion: Flexion 45 deg, Extension 45 deg, Side Bending to 45 deg bilaterally,  Rotation to 45 deg bilaterally  SLR laying: Negative  XSLR laying: Negative  Palpable tenderness: Diffusely sore along the paralumbar muscles as well as buttocks. FABER: negative. Sensory change: Gross sensation intact to all lumbar and sacral dermatomes.  Reflexes: 2+ at both patellar tendons, 2+ at achilles tendons, Babinski's downgoing.  Strength at foot  Plantar-flexion: 5/5 Dorsi-flexion: 5/5 Eversion: 5/5 Inversion: 5/5  Leg strength  Quad: 5/5 Hamstring: 5/5 Hip flexor: 5/5 Hip abductors: 5/5  Gait unremarkable.  X-rays reviewed, flexion and extension views of the cervical and lumbar spine, there are no degenerative changes, and there is no sign of ligamentous instability.  Impression and Recommendations:   This case required medical decision making of moderate complexity.
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
## 2013-09-25 ENCOUNTER — Other Ambulatory Visit: Payer: Self-pay | Admitting: *Deleted

## 2013-09-25 MED ORDER — AMLODIPINE BESYLATE 5 MG PO TABS: 5.0000 mg | ORAL_TABLET | Freq: Every day | ORAL | Status: DC

## 2013-09-25 MED ORDER — OLMESARTAN MEDOXOMIL-HCTZ 40-25 MG PO TABS: 1.0000 | ORAL_TABLET | Freq: Every day | ORAL | Status: DC

## 2013-10-13 ENCOUNTER — Other Ambulatory Visit: Payer: Self-pay

## 2013-10-13 ENCOUNTER — Other Ambulatory Visit: Payer: Self-pay | Admitting: Sports Medicine

## 2013-10-28 ENCOUNTER — Other Ambulatory Visit: Payer: Self-pay | Admitting: Sports Medicine

## 2013-11-03 ENCOUNTER — Other Ambulatory Visit: Payer: Self-pay | Admitting: Sports Medicine

## 2013-11-06 ENCOUNTER — Encounter: Payer: Self-pay | Admitting: Sports Medicine

## 2013-11-06 ENCOUNTER — Ambulatory Visit (INDEPENDENT_AMBULATORY_CARE_PROVIDER_SITE_OTHER): Payer: 59 | Admitting: Sports Medicine

## 2013-11-06 VITALS — BP 119/71 | HR 83 | Ht 71.0 in | Wt 226.0 lb

## 2013-11-06 DIAGNOSIS — A084 Viral intestinal infection, unspecified: Secondary | ICD-10-CM | POA: Insufficient documentation

## 2013-11-06 DIAGNOSIS — G4700 Insomnia, unspecified: Secondary | ICD-10-CM

## 2013-11-06 DIAGNOSIS — A088 Other specified intestinal infections: Secondary | ICD-10-CM

## 2013-11-06 DIAGNOSIS — F411 Generalized anxiety disorder: Secondary | ICD-10-CM

## 2013-11-06 MED ORDER — PROMETHAZINE HCL 25 MG PO TABS
25.0000 mg | ORAL_TABLET | Freq: Four times a day (QID) | ORAL | Status: DC | PRN
Start: 2013-11-06 — End: 2014-11-20

## 2013-11-06 MED ORDER — ALPRAZOLAM 1 MG PO TABS: 1.0000 mg | ORAL_TABLET | Freq: Three times a day (TID) | ORAL | Status: DC | PRN

## 2013-11-06 MED ORDER — DIPHENOXYLATE-ATROPINE 2.5-0.025 MG PO TABS: ORAL_TABLET | ORAL | Status: DC

## 2013-11-06 MED ORDER — ESZOPICLONE 1 MG PO TABS: 1.0000 mg | ORAL_TABLET | Freq: Every evening | ORAL | Status: DC | PRN

## 2013-11-06 NOTE — Assessment & Plan Note (Signed)
Switching to Lunesta, Ambien did not have sufficient response.

## 2013-11-06 NOTE — Assessment & Plan Note (Signed)
Hydration, Phenergan, Lomotil.

## 2013-11-06 NOTE — Progress Notes (Signed)
  Subjective:    CC: Followup  HPI: Anxiety: Was fairly apathetic on his last SSRI, did not have a good response to buspirone. Alprazolam has been the only medication has been effective, he does desire a higher dose.  Insomnia: Amb[facility] is no longer effective, [facility]  Viral illness: His entire family has had nausea, vomiting, and diarrhea, no abdominal pain, no bloody stools, no hematochezia, hematemesis, no melena. Mild fevers, desires an antibiotic and antidiarrheal.  Past medical history, Surgical history, Family history not pertinant except as noted below, Social history, Allergies, and medications have been entered into the medical record, reviewed, and no changes needed.   Review of Systems: No fevers, chills, night sweats, weight loss, chest pain, or shortness of breath.   Objective:    General: Well Developed, well nourished, and in no acute distress.  Neuro: Alert and oriented x3, extra-ocular muscles intact, sensation grossly intact.  HEENT: Normocephalic, atraumatic, pupils equal round reactive to light, neck supple, no masses, no lymphadenopathy, thyroid nonpalpable.  Skin: Warm and dry, no rashes. Cardiac: Regular rate and rhythm, no murmurs rubs or gallops, no lower extremity edema.  Respiratory: Clear to auscultation bilaterally. Not using accessory muscles, speaking in full sentences. Abdomen: Soft, nontender, nondistended, no guarding, no rigidity, normal bowel sounds, no palpable masses.  Impression and Recommendations:

## 2013-11-06 NOTE — Assessment & Plan Note (Signed)
Poor response to BuSpar. Increasing alprazolam 1 mg, #30 per month. No further increases in dosage.

## 2013-11-14 ENCOUNTER — Other Ambulatory Visit: Payer: Self-pay | Admitting: Sports Medicine

## 2013-12-05 ENCOUNTER — Other Ambulatory Visit: Payer: Self-pay | Admitting: Sports Medicine

## 2013-12-07 ENCOUNTER — Other Ambulatory Visit: Payer: Self-pay | Admitting: Sports Medicine

## 2013-12-08 ENCOUNTER — Other Ambulatory Visit: Payer: Self-pay | Admitting: Sports Medicine

## 2013-12-08 MED ORDER — ALPRAZOLAM 1 MG PO TABS: ORAL_TABLET | ORAL | Status: DC

## 2013-12-08 NOTE — Telephone Encounter (Signed)
Patient request refill for Xanax. Patient was last seen 11/06/2013 for Anxiety. Rx has been printed and placed in Dr. Ivan AnchorsHommel box to sign.  Rhonda Cunningham,CMA

## 2013-12-12 ENCOUNTER — Other Ambulatory Visit: Payer: Self-pay | Admitting: Sports Medicine

## 2014-01-05 ENCOUNTER — Telehealth: Payer: Self-pay

## 2014-01-05 MED ORDER — ALPRAZOLAM 1 MG PO TABS: ORAL_TABLET | ORAL | Status: DC

## 2014-01-05 NOTE — Telephone Encounter (Signed)
Patient request refill for Xanax 1 mg. He stated that he ran out early due to [place]. Sandar Krinke,CMA

## 2014-01-05 NOTE — Telephone Encounter (Signed)
Timing is close enough to last refill, prescription is in my box.

## 2014-01-05 NOTE — Telephone Encounter (Signed)
Patient called requested refill on Xanax. Patient stated that he ran out early due to [place] having to travel to [place] he was having to take more of them . Elli Groesbeck,CMA

## 2014-01-05 NOTE — Telephone Encounter (Signed)
Patient has been informed. Rhonda Cunningham,CMA  

## 2014-01-23 ENCOUNTER — Other Ambulatory Visit: Payer: Self-pay | Admitting: Sports Medicine

## 2014-02-05 ENCOUNTER — Other Ambulatory Visit: Payer: Self-pay

## 2014-02-05 ENCOUNTER — Telehealth: Payer: Self-pay

## 2014-02-05 MED ORDER — ALPRAZOLAM 1 MG PO TABS: ORAL_TABLET | ORAL | Status: DC

## 2014-02-05 NOTE — Telephone Encounter (Signed)
Patient has been informed that Rx is ready for pickup. Rhonda Cunningham,CMA  

## 2014-02-05 NOTE — Telephone Encounter (Signed)
Rx in box. 

## 2014-02-05 NOTE — Telephone Encounter (Signed)
Patient request refill for Xanax. Ivon Oelkers,CMA  

## 2014-02-06 ENCOUNTER — Ambulatory Visit: Payer: 59 | Admitting: Sports Medicine

## 2014-02-13 ENCOUNTER — Ambulatory Visit: Payer: 59 | Admitting: Sports Medicine

## 2014-02-14 ENCOUNTER — Other Ambulatory Visit: Payer: Self-pay | Admitting: Sports Medicine

## 2014-02-14 MED ORDER — AMLODIPINE BESYLATE 5 MG PO TABS: 5.0000 mg | ORAL_TABLET | Freq: Every day | ORAL | Status: DC

## 2014-02-19 ENCOUNTER — Other Ambulatory Visit: Payer: Self-pay | Admitting: Sports Medicine

## 2014-03-06 ENCOUNTER — Telehealth: Payer: Self-pay | Admitting: *Deleted

## 2014-03-06 MED ORDER — ALPRAZOLAM 1 MG PO TABS: ORAL_TABLET | ORAL | Status: DC

## 2014-03-06 NOTE — Telephone Encounter (Signed)
Pt is requesting refill on xanax. Ok to refill.

## 2014-03-20 ENCOUNTER — Other Ambulatory Visit: Payer: Self-pay | Admitting: Sports Medicine

## 2014-03-22 ENCOUNTER — Other Ambulatory Visit: Payer: Self-pay | Admitting: Sports Medicine

## 2014-04-11 ENCOUNTER — Telehealth: Payer: Self-pay

## 2014-04-11 NOTE — Telephone Encounter (Signed)
Patient request refill for Xanax. Please advise patient was transferred to front office to schedule a follow up appt.Rhonda Cunningham,CMA

## 2014-04-13 MED ORDER — ALPRAZOLAM 1 MG PO TABS: ORAL_TABLET | ORAL | Status: DC

## 2014-04-13 NOTE — Telephone Encounter (Signed)
Left message stating refill was faxed.

## 2014-04-13 NOTE — Telephone Encounter (Signed)
Thanks, rx in box.

## 2014-04-17 ENCOUNTER — Ambulatory Visit: Payer: 59 | Admitting: Sports Medicine

## 2014-04-20 ENCOUNTER — Other Ambulatory Visit: Payer: Self-pay | Admitting: Sports Medicine

## 2014-04-26 ENCOUNTER — Ambulatory Visit (INDEPENDENT_AMBULATORY_CARE_PROVIDER_SITE_OTHER): Payer: 59 | Admitting: Sports Medicine

## 2014-04-26 ENCOUNTER — Encounter: Payer: Self-pay | Admitting: Sports Medicine

## 2014-04-26 VITALS — BP 135/77 | HR 81 | Ht 71.0 in | Wt 232.0 lb

## 2014-04-26 DIAGNOSIS — I10 Essential (primary) hypertension: Secondary | ICD-10-CM

## 2014-04-26 DIAGNOSIS — M25569 Pain in unspecified knee: Secondary | ICD-10-CM

## 2014-04-26 DIAGNOSIS — M25562 Pain in left knee: Secondary | ICD-10-CM

## 2014-04-26 DIAGNOSIS — G4700 Insomnia, unspecified: Secondary | ICD-10-CM

## 2014-04-26 MED ORDER — ZOLPIDEM TARTRATE 10 MG PO TABS: 15.0000 mg | ORAL_TABLET | Freq: Every evening | ORAL | Status: DC | PRN

## 2014-04-26 MED ORDER — TRAMADOL HCL 50 MG PO TABS: 50.0000 mg | ORAL_TABLET | Freq: Three times a day (TID) | ORAL | Status: DC | PRN

## 2014-04-26 MED ORDER — GLUCOSAMINE-CHONDROITIN 750-600 MG PO CHEW: 2.0000 | CHEWABLE_TABLET | Freq: Two times a day (BID) | ORAL | Status: DC

## 2014-04-26 MED ORDER — DEVILS CLAW ROOT POWD: Status: DC

## 2014-04-26 NOTE — Progress Notes (Signed)
  Subjective:    CC: Followup  HPI: Insomnia: Moderate response to Ambien, no response to Lunesta. Amenable to going on a higher dose of Ambien.  Anxiety: Well controlled with occasional Xanax.  Hypertension: Well controlled.  Left knee pain: Amenable to now proceeding with arthroscopy for multiple intra-articular loose bodies.  Past medical history, Surgical history, Family history not pertinant except as noted below, Social history, Allergies, and medications have been entered into the medical record, reviewed, and no changes needed.   Review of Systems: No fevers, chills, night sweats, weight loss, chest pain, or shortness of breath.   Objective:    General: Well Developed, well nourished, and in no acute distress.  Neuro: Alert and oriented x3, extra-ocular muscles intact, sensation grossly intact.  HEENT: Normocephalic, atraumatic, pupils equal round reactive to light, neck supple, no masses, no lymphadenopathy, thyroid nonpalpable.  Skin: Warm and dry, no rashes. Cardiac: Regular rate and rhythm, no murmurs rubs or gallops, no lower extremity edema.  Respiratory: Clear to auscultation bilaterally. Not using accessory muscles, speaking in full sentences.  Impression and Recommendations:

## 2014-04-26 NOTE — Assessment & Plan Note (Signed)
Refill and tramadol. Patient will now make an appointment with the offered orthopedics. And Glucosamine and Chondroitin and devils claw.

## 2014-04-26 NOTE — Assessment & Plan Note (Signed)
Well controlled, no changes 

## 2014-04-26 NOTE — Assessment & Plan Note (Signed)
Moderate response to Ambien 10, Lunesta was not covered. Increasing Ambien just beyond to 15 mg.

## 2014-05-11 ENCOUNTER — Telehealth: Payer: Self-pay

## 2014-05-11 NOTE — Telephone Encounter (Signed)
Patient request refill for Xanax. Philip Cuevas,CMA  

## 2014-05-14 MED ORDER — ALPRAZOLAM 1 MG PO TABS: ORAL_TABLET | ORAL | Status: DC

## 2014-05-14 NOTE — Telephone Encounter (Signed)
Rx has been faxed to [facility]. Graylyn Bunney,CMA  

## 2014-05-14 NOTE — Telephone Encounter (Signed)
Rx in box. 

## 2014-05-18 ENCOUNTER — Ambulatory Visit (INDEPENDENT_AMBULATORY_CARE_PROVIDER_SITE_OTHER): Payer: 59 | Admitting: Family Medicine

## 2014-05-18 ENCOUNTER — Encounter: Payer: Self-pay | Admitting: Family Medicine

## 2014-05-18 VITALS — BP 134/85 | HR 117 | Temp 98.3°F | Wt 233.0 lb

## 2014-05-18 DIAGNOSIS — L0390 Cellulitis, unspecified: Secondary | ICD-10-CM

## 2014-05-18 DIAGNOSIS — L03317 Cellulitis of buttock: Secondary | ICD-10-CM

## 2014-05-18 DIAGNOSIS — L0231 Cutaneous abscess of buttock: Secondary | ICD-10-CM

## 2014-05-18 DIAGNOSIS — L0291 Cutaneous abscess, unspecified: Secondary | ICD-10-CM

## 2014-05-18 MED ORDER — OXYCODONE-ACETAMINOPHEN 10-325 MG PO TABS: 1.0000 | ORAL_TABLET | Freq: Three times a day (TID) | ORAL | Status: DC | PRN

## 2014-05-18 MED ORDER — CLINDAMYCIN HCL 300 MG PO CAPS: 300.0000 mg | ORAL_CAPSULE | Freq: Three times a day (TID) | ORAL | Status: DC

## 2014-05-18 NOTE — Progress Notes (Signed)
CC: Philip Cuevas is a 33 y.o. male is here for possible cyst   Subjective: HPI:  Patient complains of buttock pain localized to the top of the gluteal cleavage. It's been present for the past 5 days worsening on a daily basis. It is worse with walking, sitting or lying down. It is described as sharp pain that is nonradiating. Is been accompanied by warmth and swelling at the same location of pain. He had some subjective chills yesterday but denies fevers, nausea, racing heartbeat, nor any to gastrointestinal complaints. Symptoms are present all hours of the day, no interventions as of yet. It kept him awake the majority of the night last night   Review Of Systems Outlined In HPI  Past Medical History  Diagnosis Date  . Knee injury   . Anxiety   . Depression   . Asthma     mild, intermittent  . Hypertension     Past Surgical History  Procedure Laterality Date  . Tonsilectomy, adenoidectomy, bilateral myringotomy and tubes    . Dental surgery     Family History  Problem Relation Age of Onset  . Hypertension Mother   . Hyperlipidemia Mother   . Depression Father     committed suicide    History   Social History  . Marital Status: Married    Spouse Name: N/A    Number of Children: N/A  . Years of Education: N/A   Occupational History  . Not on file.   Social History Main Topics  . Smoking status: Current Some Day Smoker -- 0.30 packs/day for 4 years    Types: Cigarettes    Last Attempt to Quit: 10/22/2005  . Smokeless tobacco: Not on file  . Alcohol Use: No  . Drug Use: No  . 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 Not on file   Social History Narrative  . No narrative on file     Objective: BP 134/85  Pulse 117  Temp(Src) 98.3 F (36.8 C) (Oral)  Wt 233 lb (105.688 kg)  General: Alert and Oriented, No Acute Distress HEENT: Pupils  equal, round, reactive to light. Conjunctivae clear.  Moist mucous membranes Lungs: Clear comfortable work of breathing Cardiac: Regular rate and rhythm.  Extremities: No peripheral edema.  Strong peripheral pulses.  Mental Status: No depression, anxiety, nor agitation. Skin: Warm and dry. At the superiormost aspect of the right buttock there is a 2 similar diameter region of induration, erythema and firmness that is quite painful to the touch. When evaluated by bedside ultrasound it reveals there is a hypoechoic sphere approximately 1 cm in diameter underneath this site.  Assessment & Plan: Philip Cuevas was seen today for possible cyst.  Diagnoses and associated orders for this visit:  Cellulitis of buttock  Cellulitis and abscess - clindamycin (CLEOCIN) 300 MG capsule; Take 1 capsule (300 mg total) by mouth 3 (three) times daily. - oxyCODONE-acetaminophen (PERCOCET) 10-325 MG per tablet; Take 1 tablet by mouth every 8 (eight) hours as needed for pain.    Verbal consent obtained for incision and drainage. Uncomplicated I&D was performed with a small amount of pus and blood exposed from 2 different punch biopsy sites in the center of the induration overlying the sphere seen on ultrasound. Start Bactrim for suspicion of MRSA, discussed that drainage is encouraged him to change his bandage one  to 2 times a day as needed depending on drainage. If any symptoms linger on Monday return for further evaluation.  Incision and Drainage Procedure Note  Pre-operative Diagnosis: abscess  Post-operative Diagnosis: same  Indications: pain, infection  Anesthesia: 1% lidocaine with epinephrine  Procedure Details  The procedure, risks and complications have been discussed in detail (including, but not limited to airway compromise, infection, bleeding) with the patient, and the patient has signed consent to the procedure.  The skin was sterilely prepped and draped over the affected area in the usual  fashion. After adequate local anesthesia, I&D with a 4mm punch  was performed on the right buttock. Scant Purulent drainage: present The patient was observed until stable.   EBL:5cc's  Drains: none  Condition: Tolerated procedure well   Complications: none.    Return if symptoms worsen or fail to improve.

## 2014-05-18 NOTE — Patient Instructions (Signed)
Change your band-aid daily after showering or bathing, mild bleeding for the next two days is expected.

## 2014-05-23 ENCOUNTER — Telehealth: Payer: Self-pay | Admitting: Emergency Medicine

## 2014-05-23 DIAGNOSIS — L0291 Cutaneous abscess, unspecified: Secondary | ICD-10-CM

## 2014-05-23 DIAGNOSIS — L0390 Cellulitis, unspecified: Principal | ICD-10-CM

## 2014-05-23 MED ORDER — OXYCODONE-ACETAMINOPHEN 10-325 MG PO TABS: 1.0000 | ORAL_TABLET | Freq: Three times a day (TID) | ORAL | Status: DC | PRN

## 2014-05-23 NOTE — Telephone Encounter (Signed)
Dr.Hommel; I have notified the patient that we have the refill here to pick up but that he must be seen by his PCP, Dr.T, tomorrow to make sure the cyst has not returned; appt. made for 05-24-2014 at 4:15 pm. Carmina MillerPKlaers, RN

## 2014-05-23 NOTE — Telephone Encounter (Signed)
[name], please refer to my note from his encounter last week regarding: "If any symptoms linger on Monday return for further evaluation." I'm worried his cyst/abscess is returning.  Rx to last him another 1-2 days will be on my counter but he needs to see his PCP or one of us ASAP because he should be pain free now.

## 2014-05-23 NOTE — Telephone Encounter (Signed)
Patient calling to report continuation of post cyst I&D pain (9/25); he took last percoset tab this morning and wonders if refill possible; states level of pain #5-6 ; is back at work and it has been an uncomfortable day; worried about pain causing him to not sleep tonight. PKlaers, RN

## 2014-05-24 ENCOUNTER — Ambulatory Visit: Payer: 59 | Admitting: Sports Medicine

## 2014-06-12 ENCOUNTER — Telehealth: Payer: Self-pay

## 2014-06-12 MED ORDER — ALPRAZOLAM 1 MG PO TABS: ORAL_TABLET | ORAL | Status: DC

## 2014-06-12 NOTE — Telephone Encounter (Signed)
Patient request refill for Xanax. Rhonda Cunningham,CMA  

## 2014-06-12 NOTE — Telephone Encounter (Signed)
rx in box 

## 2014-06-13 NOTE — Telephone Encounter (Signed)
Rx has been faxed to patient pharmacy. Patient has been notified. Gorden Stthomas,CMA

## 2014-06-28 ENCOUNTER — Other Ambulatory Visit: Payer: Self-pay | Admitting: Sports Medicine

## 2014-06-29 ENCOUNTER — Encounter: Payer: Self-pay | Admitting: Family Medicine

## 2014-06-29 ENCOUNTER — Ambulatory Visit (INDEPENDENT_AMBULATORY_CARE_PROVIDER_SITE_OTHER): Payer: 59 | Admitting: Family Medicine

## 2014-06-29 VITALS — BP 140/87 | HR 87 | Temp 98.7°F | Wt 237.0 lb

## 2014-06-29 DIAGNOSIS — J209 Acute bronchitis, unspecified: Secondary | ICD-10-CM

## 2014-06-29 MED ORDER — OLMESARTAN MEDOXOMIL-HCTZ 40-25 MG PO TABS: ORAL_TABLET | ORAL | Status: DC

## 2014-06-29 MED ORDER — AMLODIPINE BESYLATE 5 MG PO TABS: ORAL_TABLET | ORAL | Status: DC

## 2014-06-29 MED ORDER — AZITHROMYCIN 250 MG PO TABS: ORAL_TABLET | ORAL | Status: DC

## 2014-06-29 MED ORDER — HYDROCODONE-HOMATROPINE 5-1.5 MG/5ML PO SYRP: 5.0000 mL | ORAL_SOLUTION | Freq: Every evening | ORAL | Status: DC | PRN

## 2014-06-29 NOTE — Patient Instructions (Signed)
Smoking Cessation, Tips for Success  If you are ready to quit smoking, congratulations! You have chosen to help yourself be healthier. Cigarettes bring nicotine, tar, carbon monoxide, and other irritants into your body. Your lungs, heart, and blood vessels will be able to work better without these poisons. There are many different ways to quit smoking. Nicotine gum, nicotine patches, a nicotine inhaler, or nicotine nasal spray can help with physical craving. Hypnosis, support groups, and medicines help break the habit of smoking.  WHAT THINGS CAN I DO TO MAKE QUITTING EASIER?   Here are some tips to help you quit for good:  · Pick a date when you will quit smoking completely. Tell all of your friends and family about your plan to quit on that date.  · Do not try to slowly cut down on the number of cigarettes you are smoking. Pick a quit date and quit smoking completely starting on that day.  · Throw away all cigarettes.    · Clean and remove all ashtrays from your home, work, and car.  · On a card, write down your reasons for quitting. Carry the card with you and read it when you get the urge to smoke.  · Cleanse your body of nicotine. Drink enough water and fluids to keep your urine clear or pale yellow. Do this after quitting to flush the nicotine from your body.  · Learn to predict your moods. Do not let a bad situation be your excuse to have a cigarette. Some situations in your life might tempt you into wanting a cigarette.  · Never have "just one" cigarette. It leads to wanting another and another. Remind yourself of your decision to quit.  · Change habits associated with smoking. If you smoked while driving or when feeling stressed, try other activities to replace smoking. Stand up when drinking your coffee. Brush your teeth after eating. Sit in a different chair when you read the paper. Avoid alcohol while trying to quit, and try to drink fewer caffeinated beverages. Alcohol and caffeine may urge you to  smoke.  · Avoid foods and drinks that can trigger a desire to smoke, such as sugary or spicy foods and alcohol.  · Ask people who smoke not to smoke around you.  · Have something planned to do right after eating or having a cup of coffee. For example, plan to take a walk or exercise.  · Try a relaxation exercise to calm you down and decrease your stress. Remember, you may be tense and nervous for the first 2 weeks after you quit, but this will pass.  · Find new activities to keep your hands busy. Play with a pen, coin, or rubber band. Doodle or draw things on paper.  · Brush your teeth right after eating. This will help cut down on the craving for the taste of tobacco after meals. You can also try mouthwash.    · Use oral substitutes in place of cigarettes. Try using lemon drops, carrots, cinnamon sticks, or chewing gum. Keep them handy so they are available when you have the urge to smoke.  · When you have the urge to smoke, try deep breathing.  · Designate your home as a nonsmoking area.  · If you are a heavy smoker, ask your health care provider about a prescription for nicotine chewing gum. It can ease your withdrawal from nicotine.  · Reward yourself. Set aside the cigarette money you save and buy yourself something nice.  · Look for   support from others. Join a support group or smoking cessation program. Ask someone at home or at work to help you with your plan to quit smoking.  · Always ask yourself, "Do I need this cigarette or is this just a reflex?" Tell yourself, "Today, I choose not to smoke," or "I do not want to smoke." You are reminding yourself of your decision to quit.  · Do not replace cigarette smoking with electronic cigarettes (commonly called e-cigarettes). The safety of e-cigarettes is unknown, and some may contain harmful chemicals.  · If you relapse, do not give up! Plan ahead and think about what you will do the next time you get the urge to smoke.  HOW WILL I FEEL WHEN I QUIT SMOKING?  You  may have symptoms of withdrawal because your body is used to nicotine (the addictive substance in cigarettes). You may crave cigarettes, be irritable, feel very hungry, cough often, get headaches, or have difficulty concentrating. The withdrawal symptoms are only temporary. They are strongest when you first quit but will go away within 10-14 days. When withdrawal symptoms occur, stay in control. Think about your reasons for quitting. Remind yourself that these are signs that your body is healing and getting used to being without cigarettes. Remember that withdrawal symptoms are easier to treat than the major diseases that smoking can cause.   Even after the withdrawal is over, expect periodic urges to smoke. However, these cravings are generally short lived and will go away whether you smoke or not. Do not smoke!  WHAT RESOURCES ARE AVAILABLE TO HELP ME QUIT SMOKING?  Your health care provider can direct you to community resources or hospitals for support, which may include:  · Group support.  · Education.  · Hypnosis.  · Therapy.  Document Released: 05/08/2004 Document Revised: 12/25/2013 Document Reviewed: 01/26/2013  Exit[employer]® Patient Information ©2015 [employer]. This information is not intended to replace advice given to you by your health care provider. Make sure you discuss any questions you have with your health care provider.

## 2014-06-29 NOTE — Progress Notes (Signed)
   Subjective:    Patient ID: Maryelizabeth RowanJonathan W Epler, male    DOB: 03/29/1981, 33 y.o.   MRN: [id number]  HPI 5 days of runny nose and cough.  Nasal drainage is clear.  He is coughing up dark brown sputum.  He is a smoker. + sick contacts.  NO fever, chills.  No GI sxs.  Some sinus pressure.  Mild ST about 2 days ago and that is better.  Not sleeping well bc of the cough. Chest hurts from coughing.  No ear pain.    Review of Systems     Objective:   Physical Exam  Constitutional: He is oriented to person, place, and time. He appears well-developed and well-nourished.  HENT:  Head: Normocephalic and atraumatic.  Cardiovascular: Normal rate, regular rhythm and normal heart sounds.   Pulmonary/Chest: Effort normal and breath sounds normal.  Neurological: He is alert and oriented to person, place, and time.  Skin: Skin is warm and dry.  Psychiatric: He has a normal mood and affect. His behavior is normal.          Assessment & Plan:  Acute bronchitis/ smoker with brown sputum.  Will tx with cough syrup. If not getting better or develops fever then can fill the antibiotic. Explained mostly viral.    Tob abuse - encouraged cessation.

## 2014-07-02 ENCOUNTER — Ambulatory Visit: Payer: 59 | Admitting: Family Medicine

## 2014-07-12 ENCOUNTER — Telehealth: Payer: Self-pay

## 2014-07-12 MED ORDER — ALPRAZOLAM 1 MG PO TABS: ORAL_TABLET | ORAL | Status: DC

## 2014-07-12 NOTE — Telephone Encounter (Signed)
Patient request refill for Xanax. Patient stated that his refill is due for Saturday but he is requesting it before the weekend. Jeda Pardue,CMA

## 2014-07-12 NOTE — Telephone Encounter (Signed)
Rx in box. 

## 2014-07-12 NOTE — Telephone Encounter (Signed)
Patient informed that Rx is ready for pickup. Philip Cuevas,CMA  

## 2014-08-09 ENCOUNTER — Other Ambulatory Visit: Payer: Self-pay | Admitting: Sports Medicine

## 2014-08-09 ENCOUNTER — Other Ambulatory Visit: Payer: Self-pay

## 2014-08-09 MED ORDER — ALPRAZOLAM 1 MG PO TABS: ORAL_TABLET | ORAL | Status: DC

## 2014-08-09 NOTE — Telephone Encounter (Signed)
Patient request a refill for Xanax, patient understands that he need a follow up appt before he can get another Rx. Rhonda Cunningham,CMA

## 2014-09-03 ENCOUNTER — Other Ambulatory Visit: Payer: Self-pay

## 2014-09-03 DIAGNOSIS — G4700 Insomnia, unspecified: Secondary | ICD-10-CM

## 2014-09-03 MED ORDER — ZOLPIDEM TARTRATE 10 MG PO TABS
10.0000 mg | ORAL_TABLET | Freq: Every evening | ORAL | Status: DC | PRN
Start: 2014-09-03 — End: 2014-09-04

## 2014-09-04 ENCOUNTER — Ambulatory Visit (INDEPENDENT_AMBULATORY_CARE_PROVIDER_SITE_OTHER): Payer: 59 | Admitting: Sports Medicine

## 2014-09-04 ENCOUNTER — Encounter: Payer: Self-pay | Admitting: Sports Medicine

## 2014-09-04 VITALS — BP 108/73 | HR 107 | Ht 71.0 in | Wt 225.0 lb

## 2014-09-04 DIAGNOSIS — G4700 Insomnia, unspecified: Secondary | ICD-10-CM

## 2014-09-04 DIAGNOSIS — I10 Essential (primary) hypertension: Secondary | ICD-10-CM

## 2014-09-04 DIAGNOSIS — F411 Generalized anxiety disorder: Secondary | ICD-10-CM

## 2014-09-04 MED ORDER — ALPRAZOLAM 1 MG PO TABS: 1.0000 mg | ORAL_TABLET | Freq: Three times a day (TID) | ORAL | Status: DC | PRN

## 2014-09-04 MED ORDER — SUVOREXANT 10 MG PO TABS: 1.0000 | ORAL_TABLET | Freq: Every day | ORAL | Status: DC

## 2014-09-04 NOTE — Assessment & Plan Note (Signed)
Well-controlled, refilling alprazolam, with 3 refills.

## 2014-09-04 NOTE — Assessment & Plan Note (Signed)
Insufficient response to Ambien. We are going to try Belsomra.

## 2014-09-04 NOTE — Assessment & Plan Note (Addendum)
Well-controlled, no changes. Urinalysis, looking for proteinuria.

## 2014-09-04 NOTE — Progress Notes (Signed)
  Subjective:    CC: Follow-up  HPI: Anxiety: Stable, needs a refill on Xanax, would like several refills so he does not have to come by so often.  Hypertension: Stable.  Insomnia: Insufficient response to max dose Ambien.  Past medical history, Surgical history, Family history not pertinant except as noted below, Social history, Allergies, and medications have been entered into the medical record, reviewed, and no changes needed.   Review of Systems: No fevers, chills, night sweats, weight loss, chest pain, or shortness of breath.   Objective:    General: Well Developed, well nourished, and in no acute distress.  Neuro: Alert and oriented x3, extra-ocular muscles intact, sensation grossly intact.  HEENT: Normocephalic, atraumatic, pupils equal round reactive to light, neck supple, no masses, no lymphadenopathy, thyroid nonpalpable.  Skin: Warm and dry, no rashes. Cardiac: Regular rate and rhythm, no murmurs rubs or gallops, no lower extremity edema.  Respiratory: Clear to auscultation bilaterally. Not using accessory muscles, speaking in full sentences.  Impression and Recommendations:

## 2014-09-05 LAB — URINALYSIS
Bilirubin Urine: NEGATIVE
Glucose, UA: NEGATIVE mg/dL
Hgb urine dipstick: NEGATIVE
Ketones, ur: NEGATIVE mg/dL
Leukocytes, UA: NEGATIVE
Nitrite: NEGATIVE
Protein, ur: NEGATIVE mg/dL
Specific Gravity, Urine: 1.01 (ref 1.005–1.030)
Urobilinogen, UA: 0.2 mg/dL (ref 0.0–1.0)
pH: 7 (ref 5.0–8.0)

## 2014-10-11 ENCOUNTER — Other Ambulatory Visit: Payer: Self-pay | Admitting: Sports Medicine

## 2014-10-17 ENCOUNTER — Telehealth: Payer: Self-pay

## 2014-10-17 DIAGNOSIS — S134XXS Sprain of ligaments of cervical spine, sequela: Secondary | ICD-10-CM

## 2014-10-17 MED ORDER — GABAPENTIN 300 MG PO CAPS: ORAL_CAPSULE | ORAL | Status: DC

## 2014-10-17 MED ORDER — ZOLPIDEM TARTRATE 10 MG PO TABS: 10.0000 mg | ORAL_TABLET | Freq: Every evening | ORAL | Status: DC | PRN

## 2014-10-17 NOTE — Telephone Encounter (Signed)
Not a problem, I have gone ahead and refilled his gabapentin.

## 2014-10-17 NOTE — Telephone Encounter (Signed)
Patient called stated that the Belsomra was too expensive even with discount coupon, and that he wanted to go back on the Ambien, I called the pharmacy to verify what patient had said and pharmacy verified information. I told the pharmacist to discontinue the Belsomra and to reactivate the Ambien. Patient also request a refill for Gabapentin last filled on 09/13/2013. #[id number] . Please advise if ok to refill Gabapentin. Rhonda Cunningham,CMA

## 2014-10-25 ENCOUNTER — Ambulatory Visit: Payer: 59 | Admitting: Sports Medicine

## 2014-11-02 ENCOUNTER — Ambulatory Visit: Payer: 59 | Admitting: Sports Medicine

## 2014-11-07 ENCOUNTER — Other Ambulatory Visit: Payer: Self-pay | Admitting: *Deleted

## 2014-11-07 DIAGNOSIS — M25562 Pain in left knee: Secondary | ICD-10-CM

## 2014-11-07 MED ORDER — TRAMADOL HCL 50 MG PO TABS: 50.0000 mg | ORAL_TABLET | Freq: Three times a day (TID) | ORAL | Status: DC | PRN

## 2014-11-08 ENCOUNTER — Telehealth: Payer: Self-pay

## 2014-11-08 NOTE — Telephone Encounter (Signed)
PATIENT NO SHOWED APPT ON 11/02/2014 FOR FOLLOW UP. PLEASE ADVISE PATIENT NEEDS A FOLLOW UP APPT FOR KNEE PAIN BEFORE ANOTHER REFILL OF TRAMADOL CAN BE FILLED. Rhonda Cunningham,CMA

## 2014-11-15 ENCOUNTER — Telehealth: Payer: Self-pay

## 2014-11-15 MED ORDER — ALPRAZOLAM 1 MG PO TABS: 1.0000 mg | ORAL_TABLET | Freq: Three times a day (TID) | ORAL | Status: DC | PRN

## 2014-11-15 NOTE — Telephone Encounter (Signed)
Patient request refill on Xanax. Rhonda Cunningham,CMA

## 2014-11-15 NOTE — Telephone Encounter (Signed)
rx in box 

## 2014-11-19 NOTE — Telephone Encounter (Signed)
Rx has been faxed to [facility]. Rhonda Cunningham,CMA  

## 2014-11-20 ENCOUNTER — Ambulatory Visit (INDEPENDENT_AMBULATORY_CARE_PROVIDER_SITE_OTHER): Payer: 59 | Admitting: Sports Medicine

## 2014-11-20 ENCOUNTER — Encounter: Payer: Self-pay | Admitting: Sports Medicine

## 2014-11-20 ENCOUNTER — Other Ambulatory Visit: Payer: Self-pay | Admitting: Sports Medicine

## 2014-11-20 DIAGNOSIS — G4700 Insomnia, unspecified: Secondary | ICD-10-CM

## 2014-11-20 MED ORDER — ESZOPICLONE 2 MG PO TABS: 2.0000 mg | ORAL_TABLET | Freq: Every evening | ORAL | Status: DC | PRN

## 2014-11-20 NOTE — Assessment & Plan Note (Signed)
Unable to afford Belsomra, switching to HeathLunesta.

## 2014-11-20 NOTE — Progress Notes (Signed)
  Subjective:    CC: follow-up  HPI: Anxiety: Stable on current medication, no refills needed.  Insomnia: Unable to afford Belsomra, would like to switch to NambeLunesta.  Chronic knee pain: Stable with tramadol. Understands the importance of proceeding with arthroscopy considering his loose body.  Past medical history, Surgical history, Family history not pertinant except as noted below, Social history, Allergies, and medications have been entered into the medical record, reviewed, and no changes needed.   Review of Systems: No fevers, chills, night sweats, weight loss, chest pain, or shortness of breath.   Objective:    General: Well Developed, well nourished, and in no acute distress.  Neuro: Alert and oriented x3, extra-ocular muscles intact, sensation grossly intact.  HEENT: Normocephalic, atraumatic, pupils equal round reactive to light, neck supple, no masses, no lymphadenopathy, thyroid nonpalpable.  Skin: Warm and dry, no rashes. Cardiac: Regular rate and rhythm, no murmurs rubs or gallops, no lower extremity edema.  Respiratory: Clear to auscultation bilaterally. Not using accessory muscles, speaking in full sentences.  Impression and Recommendations:

## 2014-11-22 ENCOUNTER — Ambulatory Visit: Payer: 59 | Admitting: Sports Medicine

## 2014-11-26 ENCOUNTER — Other Ambulatory Visit: Payer: Self-pay | Admitting: Sports Medicine

## 2015-01-17 ENCOUNTER — Telehealth: Payer: Self-pay

## 2015-01-17 MED ORDER — ALPRAZOLAM 1 MG PO TABS: 1.0000 mg | ORAL_TABLET | Freq: Three times a day (TID) | ORAL | Status: DC | PRN

## 2015-01-17 NOTE — Telephone Encounter (Signed)
PATIENT REQUEST REFILL FOR XANAX. PATIENT ADVISED THAT A FOLLOW UP IS NEEDED FOR FURTHER REFILLS. Philip Cuevas,CMA

## 2015-02-11 ENCOUNTER — Other Ambulatory Visit: Payer: Self-pay | Admitting: Family Medicine

## 2015-02-14 ENCOUNTER — Other Ambulatory Visit: Payer: Self-pay | Admitting: Sports Medicine
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
## 2015-03-15 ENCOUNTER — Other Ambulatory Visit: Payer: Self-pay | Admitting: Sports Medicine

## 2015-04-05 NOTE — H&P (Signed)
  NTS SOAP Note  Vital Signs:  Vitals as of: 04/04/2015: Systolic 144: Diastolic 99: Heart Rate 88: Temp 52F: Height 24ft 11in: Weight 252Lbs 0 Ounces: BMI 35.15  BMI : 35.15 kg/m2  Subjective: This 34 year old male presents for of a pilonidal cyst.  Had it i and d several weeks ago.  Started on anbiotic, which he is finishing.  No recent drainage noted.  This is the second time it has become infected.  Review of Symptoms:  Constitutional:unremarkable   Head:unremarkable Eyes:unremarkable   Nose/Mouth/Throat:unremarkable Cardiovascular:  unremarkable Respiratory:unremarkable Gastrointestinal:  unremarkable   Genitourinary:unremarkable   joint and back pain Skin:unremarkable Hematolgic/Lymphatic:unremarkable   Allergic/Immunologic:unremarkable   Past Medical History:  Reviewed  Past Medical History  Medical Problems: HTN Allergies: nkda Medications: benicar, xanax, amlodipine   Social History:Reviewed  Social History  Preferred Language: English Race:  White Ethnicity: Not Hispanic / Latino Age: 36 year Marital Status:  M Alcohol: 2 rinks a day   Smoking Status: Light tobacco smoker reviewed on 04/04/2015 Started Date:  Packs per week:  Functional Status reviewed on 04/04/2015 ------------------------------------------------ Bathing: Normal Cooking: Normal Dressing: Normal Driving: Normal Eating: Normal Managing Meds: Normal Oral Care: Normal Shopping: Normal Toileting: Normal Transferring: Normal Walking: Normal Cognitive Status reviewed on 04/04/2015 ------------------------------------------------ Attention: Normal Decision Making: Normal Language: Normal Memory: Normal Motor: Normal Perception: Normal Problem Solving: Normal Visual and Spatial: Normal   Family History:Reviewed  Family Health History Mother, Healthy;  Father, Healthy;     Objective Information: General:Well appearing, well nourished in no  distress. Heart:RRR, no murmur Lungs:  CTA bilaterally, no wheezes, rhonchi, rales.  Breathing unlabored. Healed incision over the coccyx, no abcess present.  Assessment:Pilonidal cyst  Diagnoses: 685.1  L05.91 Cyst - pilonidal (Pilonidal cyst without abscess)  Procedures: 16109 - OFFICE OUTPATIENT NEW 30 MINUTES    Plan:  Scheduled for excision of the pilonidal cyst on 04/12/15.   Patient Education:Alternative treatments to surgery were discussed with patient (and family).  Risks and benefits  of procedure including bleeding, infection, and wound breakdown were fully explained to the patient (and family) who gave informed consent. Patient/family questions were addressed.  Follow-up:Pending Surgery

## 2015-04-08 NOTE — Patient Instructions (Signed)
Philip Cuevas  04/08/2015     @PREFPERIOPPHARMACY @   Your procedure is scheduled on  04/12/2015   Report to [facility] at   645  A.M.  Call this number if you have problems the morning of surgery:  [phone]   Remember:  Do not eat food or drink liquids after midnight.  Take these medicines the morning of surgery with A SIP OF WATER  Xanax, amlodipine, benicar, flexaril, neurontin, phenergan, ultram. Take your inhaler before you come and bring it with you.   Do not wear jewelry, make-up or nail polish.  Do not wear lotions, powders, or perfumes.    Do not shave 48 hours prior to surgery.  Men may shave face and neck.  Do not bring valuables to the hospital.  [facility] is not responsible for any belongings or valuables.  Contacts, dentures or bridgework may not be worn into surgery.  Leave your suitcase in the car.  After surgery it may be brought to your room.  For patients admitted to the hospital, discharge time will be determined by your treatment team.  Patients discharged the day of surgery will not be allowed to drive home.   Name and phone number of your driver:   family Special instructions:  none  Please read over the following fact sheets that you were given. Pain Booklet, Coughing and Deep Breathing, Surgical Site Infection Prevention, Anesthesia Post-op Instructions and Care and Recovery After Surgery      Pilonidal Cyst A pilonidal cyst occurs when hairs get trapped (ingrown) beneath the skin in the crease between the buttocks over your sacrum (the bone under that crease). Pilonidal cysts are most common in young men with a lot of body hair. When the cyst is ruptured (breaks) or leaking, fluid from the cyst may cause burning and itching. If the cyst becomes infected, it causes a painful swelling filled with pus (abscess). The pus and trapped hairs need to be removed (often by lancing) so that the infection can heal. However, recurrence is common  and an operation may be needed to remove the cyst. HOME CARE INSTRUCTIONS   If the cyst was NOT INFECTED:  Keep the area clean and dry. Bathe or shower daily. Wash the area well with a germ-killing soap. Warm tub baths may help prevent infection and help with drainage. Dry the area well with a towel.  Avoid tight clothing to keep area as moisture free as possible.  Keep area between buttocks as free of hair as possible. A depilatory may be used.  If the cyst WAS INFECTED and needed to be drained:  Your caregiver packed the wound with gauze to keep the wound open. This allows the wound to heal from the inside outwards and continue draining.  Return for a wound check in 1 day or as suggested.  If you take tub baths or showers, repack the wound with gauze following them. Sponge baths (at the sink) are a good alternative.  If an antibiotic was ordered to fight the infection, take as directed.  Only take over-the-counter or prescription medicines for pain, discomfort, or fever as directed by your caregiver.  After the drain is removed, use sitz baths for 20 minutes 4 times per day. Clean the wound gently with mild unscented soap, pat dry, and then apply a dry dressing. SEEK MEDICAL CARE IF:   You have increased pain, swelling, redness, drainage, or bleeding from the area.  You have a  fever.  You have muscles aches, dizziness, or a general ill feeling. Document Released: 08/07/2000 Document Revised: 11/02/2011 Document Reviewed: 10/05/2008 [facility] Patient Information 2015 [place], [place]. This information is not intended to replace advice given to you by your health care provider. Make sure you discuss any questions you have with your health care provider. PATIENT INSTRUCTIONS POST-ANESTHESIA  IMMEDIATELY FOLLOWING SURGERY:  Do not drive or operate machinery for the first twenty four hours after surgery.  Do not make any important decisions for twenty four hours after surgery or while  taking narcotic pain medications or sedatives.  If you develop intractable nausea and vomiting or a severe headache please notify your doctor immediately.  FOLLOW-UP:  Please make an appointment with your surgeon as instructed. You do not need to follow up with anesthesia unless specifically instructed to do so.  WOUND CARE INSTRUCTIONS (if applicable):  Keep a dry clean dressing on the anesthesia/puncture wound site if there is drainage.  Once the wound has quit draining you may leave it open to air.  Generally you should leave the bandage intact for twenty four hours unless there is drainage.  If the epidural site drains for more than 36-48 hours please call the anesthesia department.  QUESTIONS?:  Please feel free to call your physician or the hospital operator if you have any questions, and they will be happy to assist you.

## 2015-04-09 ENCOUNTER — Encounter (HOSPITAL_COMMUNITY): Payer: Self-pay

## 2015-04-09 ENCOUNTER — Other Ambulatory Visit: Payer: Self-pay

## 2015-04-09 ENCOUNTER — Encounter (HOSPITAL_COMMUNITY)
Admission: RE | Admit: 2015-04-09 | Discharge: 2015-04-09 | Disposition: A | Discharge status: Home / Self Care | Payer: 59 | Source: Ambulatory Visit | Attending: General Surgery | Admitting: General Surgery

## 2015-04-09 ENCOUNTER — Other Ambulatory Visit (HOSPITAL_COMMUNITY): Payer: 59

## 2015-04-09 DIAGNOSIS — F17200 Nicotine dependence, unspecified, uncomplicated: Secondary | ICD-10-CM | POA: Diagnosis not present

## 2015-04-09 DIAGNOSIS — F418 Other specified anxiety disorders: Secondary | ICD-10-CM | POA: Diagnosis not present

## 2015-04-09 DIAGNOSIS — I10 Essential (primary) hypertension: Secondary | ICD-10-CM | POA: Diagnosis not present

## 2015-04-09 DIAGNOSIS — Z01812 Encounter for preprocedural laboratory examination: Secondary | ICD-10-CM | POA: Diagnosis not present

## 2015-04-09 DIAGNOSIS — Z79899 Other long term (current) drug therapy: Secondary | ICD-10-CM | POA: Diagnosis not present

## 2015-04-09 DIAGNOSIS — Z01810 Encounter for preprocedural cardiovascular examination: Secondary | ICD-10-CM | POA: Diagnosis not present

## 2015-04-09 DIAGNOSIS — L0591 Pilonidal cyst without abscess: Secondary | ICD-10-CM | POA: Diagnosis not present

## 2015-04-09 LAB — CBC WITH DIFFERENTIAL/PLATELET
BASOS ABS: 0 10*3/uL (ref 0.0–0.1)
BASOS PCT: 0 % (ref 0–1)
EOS ABS: 0.3 10*3/uL (ref 0.0–0.7)
Eosinophils Relative: 6 % — ABNORMAL HIGH (ref 0–5)
HCT: 44.3 % (ref 39.0–52.0)
HEMOGLOBIN: 16.1 g/dL (ref 13.0–17.0)
Lymphocytes Relative: 30 % (ref 12–46)
Lymphs Abs: 1.4 10*3/uL (ref 0.7–4.0)
MCH: 34 pg (ref 26.0–34.0)
MCHC: 36.3 g/dL — AB (ref 30.0–36.0)
MCV: 93.5 fL (ref 78.0–100.0)
MONO ABS: 0.3 10*3/uL (ref 0.1–1.0)
MONOS PCT: 6 % (ref 3–12)
NEUTROS ABS: 2.6 10*3/uL (ref 1.7–7.7)
NEUTROS PCT: 58 % (ref 43–77)
Platelets: 151 10*3/uL (ref 150–400)
RBC: 4.74 MIL/uL (ref 4.22–5.81)
RDW: 13 % (ref 11.5–15.5)
WBC: 4.5 10*3/uL (ref 4.0–10.5)

## 2015-04-09 LAB — BASIC METABOLIC PANEL
ANION GAP: 7 (ref 5–15)
BUN: 13 mg/dL (ref 6–20)
CALCIUM: 9.3 mg/dL (ref 8.9–10.3)
CO2: 26 mmol/L (ref 22–32)
CREATININE: 1.01 mg/dL (ref 0.61–1.24)
Chloride: 103 mmol/L (ref 101–111)
Glucose, Bld: 152 mg/dL — ABNORMAL HIGH (ref 65–99)
Potassium: 3.4 mmol/L — ABNORMAL LOW (ref 3.5–5.1)
Sodium: 136 mmol/L (ref 135–145)

## 2015-04-12 ENCOUNTER — Encounter (HOSPITAL_COMMUNITY): Payer: Self-pay | Admitting: *Deleted

## 2015-04-12 ENCOUNTER — Ambulatory Visit (HOSPITAL_COMMUNITY)
Admission: RE | Admit: 2015-04-12 | Discharge: 2015-04-12 | Disposition: A | Discharge status: Home / Self Care | Payer: 59 | Source: Ambulatory Visit | Attending: General Surgery | Admitting: General Surgery

## 2015-04-12 ENCOUNTER — Encounter (HOSPITAL_COMMUNITY)
Admission: RE | Disposition: A | Discharge status: Home / Self Care | Payer: Self-pay | Source: Ambulatory Visit | Attending: General Surgery

## 2015-04-12 ENCOUNTER — Ambulatory Visit (HOSPITAL_COMMUNITY): Payer: 59 | Admitting: Anesthesiology

## 2015-04-12 DIAGNOSIS — F418 Other specified anxiety disorders: Secondary | ICD-10-CM | POA: Insufficient documentation

## 2015-04-12 DIAGNOSIS — L0591 Pilonidal cyst without abscess: Secondary | ICD-10-CM | POA: Insufficient documentation

## 2015-04-12 DIAGNOSIS — Z01810 Encounter for preprocedural cardiovascular examination: Secondary | ICD-10-CM | POA: Insufficient documentation

## 2015-04-12 DIAGNOSIS — F17200 Nicotine dependence, unspecified, uncomplicated: Secondary | ICD-10-CM | POA: Insufficient documentation

## 2015-04-12 DIAGNOSIS — Z79899 Other long term (current) drug therapy: Secondary | ICD-10-CM | POA: Insufficient documentation

## 2015-04-12 DIAGNOSIS — Z01812 Encounter for preprocedural laboratory examination: Secondary | ICD-10-CM | POA: Insufficient documentation

## 2015-04-12 DIAGNOSIS — I10 Essential (primary) hypertension: Secondary | ICD-10-CM | POA: Insufficient documentation

## 2015-04-12 HISTORY — PX: PILONIDAL CYST EXCISION: SHX744

## 2015-04-12 SURGERY — EXCISION, SIMPLE PILONIDAL CYST
Anesthesia: General | Site: Coccyx

## 2015-04-12 MED ORDER — DEXAMETHASONE SODIUM PHOSPHATE 4 MG/ML IJ SOLN
INTRAMUSCULAR | Status: AC
  Filled 2015-04-12: qty 1

## 2015-04-12 MED ORDER — GLYCOPYRROLATE 0.2 MG/ML IJ SOLN
INTRAMUSCULAR | Status: DC | PRN
  Administered 2015-04-12: 0.2 mg via INTRAVENOUS

## 2015-04-12 MED ORDER — FENTANYL CITRATE (PF) 100 MCG/2ML IJ SOLN
25.0000 ug | INTRAMUSCULAR | Status: DC | PRN
  Administered 2015-04-12 (×3): 50 ug via INTRAVENOUS
  Filled 2015-04-12 (×2): qty 2

## 2015-04-12 MED ORDER — PHENYLEPHRINE 40 MCG/ML (10ML) SYRINGE FOR IV PUSH (FOR BLOOD PRESSURE SUPPORT)
PREFILLED_SYRINGE | INTRAVENOUS | Status: AC
  Filled 2015-04-12: qty 10

## 2015-04-12 MED ORDER — PROPOFOL 10 MG/ML IV BOLUS
INTRAVENOUS | Status: AC
  Filled 2015-04-12: qty 20

## 2015-04-12 MED ORDER — DEXAMETHASONE SODIUM PHOSPHATE 4 MG/ML IJ SOLN
4.0000 mg | Freq: Once | INTRAMUSCULAR | Status: AC
  Administered 2015-04-12: 4 mg via INTRAVENOUS

## 2015-04-12 MED ORDER — SUCCINYLCHOLINE CHLORIDE 20 MG/ML IJ SOLN
INTRAMUSCULAR | Status: DC | PRN
  Administered 2015-04-12: 140 mg via INTRAVENOUS

## 2015-04-12 MED ORDER — GLYCOPYRROLATE 0.2 MG/ML IJ SOLN
INTRAMUSCULAR | Status: AC
  Filled 2015-04-12: qty 1

## 2015-04-12 MED ORDER — CEFAZOLIN SODIUM-DEXTROSE 2-3 GM-% IV SOLR
2.0000 g | INTRAVENOUS | Status: AC
  Administered 2015-04-12: 2 g via INTRAVENOUS

## 2015-04-12 MED ORDER — SUCCINYLCHOLINE CHLORIDE 20 MG/ML IJ SOLN
INTRAMUSCULAR | Status: AC
  Filled 2015-04-12: qty 1

## 2015-04-12 MED ORDER — LACTATED RINGERS IV SOLN
INTRAVENOUS | Status: DC
  Administered 2015-04-12: 09:00:00 via INTRAVENOUS
  Administered 2015-04-12: 1000 mL via INTRAVENOUS

## 2015-04-12 MED ORDER — POVIDONE-IODINE 10 % EX OINT
TOPICAL_OINTMENT | CUTANEOUS | Status: AC
  Filled 2015-04-12: qty 1

## 2015-04-12 MED ORDER — EPHEDRINE SULFATE 50 MG/ML IJ SOLN
INTRAMUSCULAR | Status: DC | PRN
  Administered 2015-04-12: 5 mg via INTRAVENOUS

## 2015-04-12 MED ORDER — CEFAZOLIN SODIUM-DEXTROSE 2-3 GM-% IV SOLR
INTRAVENOUS | Status: AC
  Filled 2015-04-12: qty 50

## 2015-04-12 MED ORDER — FENTANYL CITRATE (PF) 100 MCG/2ML IJ SOLN
INTRAMUSCULAR | Status: AC
  Filled 2015-04-12: qty 4

## 2015-04-12 MED ORDER — ONDANSETRON HCL 4 MG/2ML IJ SOLN: 4.0000 mg | Freq: Once | INTRAMUSCULAR | Status: DC | PRN

## 2015-04-12 MED ORDER — FENTANYL CITRATE (PF) 250 MCG/5ML IJ SOLN
INTRAMUSCULAR | Status: AC
  Filled 2015-04-12: qty 25

## 2015-04-12 MED ORDER — OXYCODONE-ACETAMINOPHEN 7.5-325 MG PO TABS: 1.0000 | ORAL_TABLET | ORAL | Status: DC | PRN

## 2015-04-12 MED ORDER — FENTANYL CITRATE (PF) 100 MCG/2ML IJ SOLN
INTRAMUSCULAR | Status: DC | PRN
  Administered 2015-04-12 (×5): 50 ug via INTRAVENOUS

## 2015-04-12 MED ORDER — SODIUM CHLORIDE 0.9 % IJ SOLN
INTRAMUSCULAR | Status: AC
  Filled 2015-04-12: qty 10

## 2015-04-12 MED ORDER — GLYCOPYRROLATE 0.2 MG/ML IJ SOLN
0.2000 mg | Freq: Once | INTRAMUSCULAR | Status: AC
  Administered 2015-04-12: 0.2 mg via INTRAVENOUS

## 2015-04-12 MED ORDER — MIDAZOLAM HCL 2 MG/2ML IJ SOLN
1.0000 mg | INTRAMUSCULAR | Status: DC | PRN
  Administered 2015-04-12 (×2): 2 mg via INTRAVENOUS
  Filled 2015-04-12: qty 2

## 2015-04-12 MED ORDER — FENTANYL CITRATE (PF) 100 MCG/2ML IJ SOLN
INTRAMUSCULAR | Status: AC
  Filled 2015-04-12: qty 2

## 2015-04-12 MED ORDER — POVIDONE-IODINE 10 % OINT PACKET
TOPICAL_OINTMENT | CUTANEOUS | Status: DC | PRN
  Administered 2015-04-12: 1 via TOPICAL

## 2015-04-12 MED ORDER — BUPIVACAINE HCL (PF) 0.5 % IJ SOLN
INTRAMUSCULAR | Status: AC
  Filled 2015-04-12: qty 30

## 2015-04-12 MED ORDER — EPHEDRINE SULFATE 50 MG/ML IJ SOLN
INTRAMUSCULAR | Status: AC
  Filled 2015-04-12: qty 1

## 2015-04-12 MED ORDER — SODIUM CHLORIDE 0.9 % IR SOLN
Status: DC | PRN
  Administered 2015-04-12: 1000 mL

## 2015-04-12 MED ORDER — ONDANSETRON HCL 4 MG/2ML IJ SOLN
4.0000 mg | Freq: Once | INTRAMUSCULAR | Status: AC
  Administered 2015-04-12: 4 mg via INTRAVENOUS

## 2015-04-12 MED ORDER — KETOROLAC TROMETHAMINE 30 MG/ML IJ SOLN
30.0000 mg | Freq: Once | INTRAMUSCULAR | Status: AC
  Administered 2015-04-12: 30 mg via INTRAVENOUS
  Filled 2015-04-12: qty 1

## 2015-04-12 MED ORDER — LIDOCAINE HCL (PF) 1 % IJ SOLN
INTRAMUSCULAR | Status: AC
  Filled 2015-04-12: qty 5

## 2015-04-12 MED ORDER — MIDAZOLAM HCL 2 MG/2ML IJ SOLN
INTRAMUSCULAR | Status: AC
  Filled 2015-04-12: qty 2

## 2015-04-12 MED ORDER — LIDOCAINE HCL (CARDIAC) 20 MG/ML IV SOLN
INTRAVENOUS | Status: DC | PRN
  Administered 2015-04-12: 50 mg via INTRAVENOUS
  Administered 2015-04-12: 200 mg via INTRAVENOUS
  Administered 2015-04-12: 50 mg via INTRAVENOUS

## 2015-04-12 MED ORDER — FENTANYL CITRATE (PF) 100 MCG/2ML IJ SOLN
25.0000 ug | INTRAMUSCULAR | Status: AC
  Administered 2015-04-12 (×2): 25 ug via INTRAVENOUS

## 2015-04-12 MED ORDER — BUPIVACAINE HCL 0.5 % IJ SOLN
INTRAMUSCULAR | Status: DC | PRN
  Administered 2015-04-12: 10 mL

## 2015-04-12 MED ORDER — CHLORHEXIDINE GLUCONATE 4 % EX LIQD: 1.0000 "application " | Freq: Once | CUTANEOUS | Status: DC

## 2015-04-12 MED ORDER — PHENYLEPHRINE HCL 10 MG/ML IJ SOLN
INTRAMUSCULAR | Status: DC | PRN
  Administered 2015-04-12 (×3): 100 ug via INTRAVENOUS

## 2015-04-12 MED ORDER — ONDANSETRON HCL 4 MG/2ML IJ SOLN
INTRAMUSCULAR | Status: AC
  Filled 2015-04-12: qty 2

## 2015-04-12 SURGICAL SUPPLY — 28 items
BAG HAMPER (MISCELLANEOUS) ×3 IMPLANT
CLOTH BEACON ORANGE TIMEOUT ST (SAFETY) ×3 IMPLANT
COVER LIGHT HANDLE STERIS (MISCELLANEOUS) ×6 IMPLANT
ELECT REM PT RETURN 9FT ADLT (ELECTROSURGICAL) ×3
ELECTRODE REM PT RTRN 9FT ADLT (ELECTROSURGICAL) ×1 IMPLANT
FORMALIN 10 PREFIL 120ML (MISCELLANEOUS) ×3 IMPLANT
GAUZE SPONGE 4X4 12PLY STRL (GAUZE/BANDAGES/DRESSINGS) ×2 IMPLANT
GAUZE XEROFORM 5X9 LF (GAUZE/BANDAGES/DRESSINGS) IMPLANT
GLOVE SURG SS PI 7.5 STRL IVOR (GLOVE) ×3 IMPLANT
GOWN STRL REUS W/TWL LRG LVL3 (GOWN DISPOSABLE) ×6 IMPLANT
KIT ROOM TURNOVER APOR (KITS) ×3 IMPLANT
MANIFOLD NEPTUNE II (INSTRUMENTS) ×3 IMPLANT
NEEDLE HYPO 25X1 1.5 SAFETY (NEEDLE) ×3 IMPLANT
NS IRRIG 1000ML POUR BTL (IV SOLUTION) ×3 IMPLANT
PACK MINOR (CUSTOM PROCEDURE TRAY) ×3 IMPLANT
PAD ABD 5X9 TENDERSORB (GAUZE/BANDAGES/DRESSINGS) ×3 IMPLANT
PAD ARMBOARD 7.5X6 YLW CONV (MISCELLANEOUS) ×3 IMPLANT
SET BASIN LINEN APH (SET/KITS/TRAYS/PACK) ×3 IMPLANT
SOL PREP PROV IODINE SCRUB 4OZ (MISCELLANEOUS) ×3 IMPLANT
SPONGE GAUZE 2X2 8PLY STER LF (GAUZE/BANDAGES/DRESSINGS)
SPONGE GAUZE 2X2 8PLY STRL LF (GAUZE/BANDAGES/DRESSINGS) IMPLANT
SPONGE GAUZE 4X4 12PLY (GAUZE/BANDAGES/DRESSINGS) ×3 IMPLANT
SPONGE LAP 18X18 X RAY DECT (DISPOSABLE) ×3 IMPLANT
SUT PROLENE 3 0 PS 2 (SUTURE) ×6 IMPLANT
SUT VIC AB 2-0 CT2 27 (SUTURE) ×3 IMPLANT
SYRINGE 10CC LL (SYRINGE) ×3 IMPLANT
TAPE CLOTH SURG 4X10 WHT LF (GAUZE/BANDAGES/DRESSINGS) ×3 IMPLANT
TOWEL OR 17X26 4PK STRL BLUE (TOWEL DISPOSABLE) IMPLANT

## 2015-04-12 NOTE — Interval H&P Note (Signed)
History and Physical Interval Note:  04/12/2015 7:46 AM  Philip Cuevas  has presented today for surgery, with the diagnosis of pilonidal cyst  The various methods of treatment have been discussed with the patient and family. After consideration of risks, benefits and other options for treatment, the patient has consented to  Procedure(s) with comments: CYST EXCISION PILONIDAL SIMPLE (N/A) - Dr. has 7:30 Endo as a surgical intervention .  The patient's history has been reviewed, patient examined, no change in status, stable for surgery.  I have reviewed the patient's chart and labs.  Questions were answered to the patient's satisfaction.     [name]

## 2015-04-12 NOTE — Anesthesia Procedure Notes (Signed)
Procedure Name: Intubation Date/Time: 04/12/2015 8:14 AM Performed by: Pernell Dupre, AMY A Pre-anesthesia Checklist: Patient identified, Patient being monitored, Timeout performed, Emergency Drugs available and Suction available Patient Re-evaluated:Patient Re-evaluated prior to inductionOxygen Delivery Method: Circle System Utilized Preoxygenation: Pre-oxygenation with 100% oxygen Intubation Type: IV induction Ventilation: Mask ventilation without difficulty Laryngoscope Size: 3 and Miller Grade View: Grade I Tube type: Oral Tube size: 7.0 mm Number of attempts: 1 Airway Equipment and Method: Stylet Placement Confirmation: ETT inserted through vocal cords under direct vision,  positive ETCO2 and breath sounds checked- equal and bilateral Secured at: 21 cm Tube secured with: Tape Dental Injury: Teeth and Oropharynx as per pre-operative assessment

## 2015-04-12 NOTE — Anesthesia Postprocedure Evaluation (Signed)
  Anesthesia Post-op Note  Patient: Philip Cuevas  Procedure(s) Performed: Procedure(s): CYST EXCISION PILONIDAL SIMPLE (N/A)  Patient Location: PACU  Anesthesia Type:General  Level of Consciousness: awake, alert , oriented and patient cooperative  Airway and Oxygen Therapy: Patient Spontanous Breathing and Patient connected to face mask oxygen  Post-op Pain: mild  Post-op Assessment: Post-op Vital signs reviewed, Patient's Cardiovascular Status Stable, Respiratory Function Stable, Patent Airway, No signs of Nausea or vomiting, Pain level controlled and No headache              Post-op Vital Signs: Reviewed and stable  Last Vitals:  Filed Vitals:   04/12/15 0800  BP: 138/90  Temp:   Resp: 22    Complications: No apparent anesthesia complications

## 2015-04-12 NOTE — Discharge Instructions (Signed)
Pilonidal Cyst A pilonidal cyst occurs when hairs get trapped (ingrown) beneath the skin in the crease between the buttocks over your sacrum (the bone under that crease). Pilonidal cysts are most common in young men with a lot of body hair. When the cyst is ruptured (breaks) or leaking, fluid from the cyst may cause burning and itching. If the cyst becomes infected, it causes a painful swelling filled with pus (abscess). The pus and trapped hairs need to be removed (often by lancing) so that the infection can heal. However, recurrence is common and an operation may be needed to remove the cyst. HOME CARE INSTRUCTIONS   If the cyst was NOT INFECTED:  Keep the area clean and dry. Bathe or shower daily. Wash the area well with a germ-killing soap. Warm tub baths may help prevent infection and help with drainage. Dry the area well with a towel.  Avoid tight clothing to keep area as moisture free as possible.  Keep area between buttocks as free of hair as possible. A depilatory may be used.  If the cyst WAS INFECTED and needed to be drained:  Your caregiver packed the wound with gauze to keep the wound open. This allows the wound to heal from the inside outwards and continue draining.  Return for a wound check in 1 day or as suggested.  If you take tub baths or showers, repack the wound with gauze following them. Sponge baths (at the sink) are a good alternative.  If an antibiotic was ordered to fight the infection, take as directed.  Only take over-the-counter or prescription medicines for pain, discomfort, or fever as directed by your caregiver.  After the drain is removed, use sitz baths for 20 minutes 4 times per day. Clean the wound gently with mild unscented soap, pat dry, and then apply a dry dressing. SEEK MEDICAL CARE IF:   You have increased pain, swelling, redness, drainage, or bleeding from the area.  You have a fever.  You have muscles aches, dizziness, or a general ill  feeling. Document Released: 08/07/2000 Document Revised: 11/02/2011 Document Reviewed: 10/05/2008 [employer] Patient Information 2015 [employer]. This information is not intended to replace advice given to you by your health care provider. Make sure you discuss any questions you have with your health care provider.  

## 2015-04-12 NOTE — Anesthesia Preprocedure Evaluation (Signed)
Anesthesia Evaluation  Patient identified by MRN, date of birth, ID band Patient awake    Reviewed: Allergy & Precautions, NPO status , Patient's Chart, lab work & pertinent test results  Airway Mallampati: II  TM Distance: >3 FB     Dental  (+) Teeth Intact, Dental Advisory Given   Pulmonary asthma , Current Smoker,  breath sounds clear to auscultation        Cardiovascular hypertension, Pt. on medications Rhythm:Regular Rate:Normal     Neuro/Psych PSYCHIATRIC DISORDERS Anxiety Depression    GI/Hepatic negative GI ROS,   Endo/Other    Renal/GU      Musculoskeletal   Abdominal   Peds  Hematology   Anesthesia Other Findings   Reproductive/Obstetrics                             Anesthesia Physical Anesthesia Plan  ASA: II  Anesthesia Plan: General   Post-op Pain Management:    Induction: Intravenous  Airway Management Planned: LMA  Additional Equipment:   Intra-op Plan:   Post-operative Plan: Extubation in OR  Informed Consent: I have reviewed the patients History and Physical, chart, labs and discussed the procedure including the risks, benefits and alternatives for the proposed anesthesia with the patient or authorized representative who has indicated his/her understanding and acceptance.     Plan Discussed with:   Anesthesia Plan Comments:         Anesthesia Quick Evaluation

## 2015-04-12 NOTE — Transfer of Care (Signed)
Immediate Anesthesia Transfer of Care Note  Patient: Philip Cuevas  Procedure(s) Performed: Procedure(s): CYST EXCISION PILONIDAL SIMPLE (N/A)  Patient Location: PACU  Anesthesia Type:General  Level of Consciousness: awake, alert , oriented and patient cooperative  Airway & Oxygen Therapy: Patient Spontanous Breathing and Patient connected to face mask oxygen  Post-op Assessment: Report given to RN and Post -op Vital signs reviewed and stable  Post vital signs: Reviewed and stable  Last Vitals:  Filed Vitals:   04/12/15 0800  BP: 138/90  Temp:   Resp: 22    Complications: No apparent anesthesia complications

## 2015-04-12 NOTE — Op Note (Signed)
Patient:  Philip Cuevas  DOB:  April 23, 1981  MRN:  [id number]   Preop Diagnosis:  Pilonidal cyst  Postop Diagnosis:  Same  Procedure:  Excision of pilonidal cyst  Surgeon:  Franky Macho, M.D.  Anes:  Gen. endotracheal  Indications:  Patient is a 34 year old white male who is had several episodes of drainage from pilonidal cyst. He now presents for excision of a pilonidal cyst. The risks and benefits of the procedure including bleeding, infection, recurrence of the cyst, and the possibility of wound breakdown were fully explained to the patient, who gave informed consent.  Procedure note:  The patient was placed in the right lateral decubitus position after induction of general endotracheal anesthesia. The coccyx region was prepped and draped using the usual sterile technique with DuraPrep. Surgical site confirmation was performed.  An elliptical incision was made over the pilonidal cyst region. This measured approximately 2 cm in its greatest diameter. The dissection was taken down to the deep tissue. A minimal amount of granulation tissue present. No purulent fluid was present. This all was excised without difficulty and sent to pathology further examination. Any bleeding was controlled using Bovie electrocautery. The wound was irrigated normal saline. 0.5% Sensorcaine was instilled the surrounding wound. The subcutaneous layer was reapproximated using a 2-0 Vicryl interrupted suture. The skin was closed using 3-0 proline interrupted sutures. Betadine ointment and dry sterile dressing were applied.  All tape and needle counts were correct at the end of the procedure. Patient was extubated in the operating room and transferred to PACU in stable condition.  Complications:  None  EBL:  Minimal  Specimen:  Pilonidal cyst

## 2015-04-15 ENCOUNTER — Encounter (HOSPITAL_COMMUNITY): Payer: Self-pay | Admitting: General Surgery

## 2015-04-19 ENCOUNTER — Telehealth: Payer: Self-pay

## 2015-04-19 MED ORDER — ALPRAZOLAM 1 MG PO TABS: ORAL_TABLET | ORAL | Status: DC

## 2015-04-19 NOTE — Telephone Encounter (Signed)
PATIENT REQUEST REFILL FOR XANAX 1 MG. RX WILL BE SENT TO [facility] ONCE SIGNED BY PCP. Rhonda Cunningham,CMA

## 2015-05-23 ENCOUNTER — Ambulatory Visit: Payer: 59 | Admitting: Sports Medicine

## 2015-05-28 ENCOUNTER — Ambulatory Visit (INDEPENDENT_AMBULATORY_CARE_PROVIDER_SITE_OTHER): Payer: 59 | Admitting: Sports Medicine

## 2015-05-28 ENCOUNTER — Encounter: Payer: Self-pay | Admitting: Sports Medicine

## 2015-05-28 VITALS — BP 132/79 | HR 87 | Ht 71.0 in | Wt 250.0 lb

## 2015-05-28 DIAGNOSIS — G4700 Insomnia, unspecified: Secondary | ICD-10-CM | POA: Diagnosis not present

## 2015-05-28 DIAGNOSIS — F411 Generalized anxiety disorder: Secondary | ICD-10-CM | POA: Diagnosis not present

## 2015-05-28 DIAGNOSIS — I10 Essential (primary) hypertension: Secondary | ICD-10-CM | POA: Diagnosis not present

## 2015-05-28 MED ORDER — ALPRAZOLAM 1 MG PO TABS: ORAL_TABLET | ORAL | Status: DC

## 2015-05-28 MED ORDER — ZALEPLON 5 MG PO CAPS: 5.0000 mg | ORAL_CAPSULE | Freq: Every evening | ORAL | Status: DC | PRN

## 2015-05-28 NOTE — Assessment & Plan Note (Signed)
Excellent response to Lunesta but side effects of metallic taste. Did not respond to Ambien, and unable to get Belsomra covered. Switching to Sonata, we can try trazodone if no response.

## 2015-05-28 NOTE — Assessment & Plan Note (Signed)
Refill Xanax

## 2015-05-28 NOTE — Assessment & Plan Note (Addendum)
No changes needed. Ultimately we do need some routine screening blood work.

## 2015-05-28 NOTE — Progress Notes (Signed)
  Subjective:    CC: follow-up  HPI: Insomnia: Did not respond to Ambien, Belsomra was too expensive, Lunesta provided fantastic response but he did get a bit of a metallic taste, amenable to try Sonata.  Anxiety: Well controlled with an occasional Xanax.  Hypertension: Well controlled.  Past medical history, Surgical history, Family history not pertinant except as noted below, Social history, Allergies, and medications have been entered into the medical record, reviewed, and no changes needed.   Review of Systems: No fevers, chills, night sweats, weight loss, chest pain, or shortness of breath.   Objective:    General: Well Developed, well nourished, and in no acute distress.  Neuro: Alert and oriented x3, extra-ocular muscles intact, sensation grossly intact.  HEENT: Normocephalic, atraumatic, pupils equal round reactive to light, neck supple, no masses, no lymphadenopathy, thyroid nonpalpable.  Skin: Warm and dry, no rashes. Cardiac: Regular rate and rhythm, no murmurs rubs or gallops, no lower extremity edema.  Respiratory: Clear to auscultation bilaterally. Not using accessory muscles, speaking in full sentences.  Impression and Recommendations:   I spent 25 minutes with this patient, greater than 50% was face-to-face time counseling regarding the above diagnoses

## 2015-07-16 ENCOUNTER — Other Ambulatory Visit: Payer: Self-pay | Admitting: Sports Medicine

## 2015-07-16 DIAGNOSIS — F411 Generalized anxiety disorder: Secondary | ICD-10-CM

## 2015-07-16 MED ORDER — ALPRAZOLAM 1 MG PO TABS: ORAL_TABLET | ORAL | Status: DC

## 2015-08-09 ENCOUNTER — Other Ambulatory Visit: Payer: Self-pay | Admitting: Sports Medicine

## 2015-09-02 ENCOUNTER — Other Ambulatory Visit: Payer: Self-pay | Admitting: Sports Medicine

## 2015-09-03 ENCOUNTER — Other Ambulatory Visit: Payer: Self-pay | Admitting: Sports Medicine

## 2015-09-04 ENCOUNTER — Other Ambulatory Visit: Payer: Self-pay | Admitting: Sports Medicine

## 2015-09-04 ENCOUNTER — Other Ambulatory Visit: Payer: Self-pay

## 2015-09-04 DIAGNOSIS — F411 Generalized anxiety disorder: Secondary | ICD-10-CM

## 2015-09-04 MED ORDER — ALPRAZOLAM 1 MG PO TABS: ORAL_TABLET | ORAL | Status: DC

## 2015-10-03 ENCOUNTER — Other Ambulatory Visit: Payer: Self-pay | Admitting: Sports Medicine

## 2015-10-03 ENCOUNTER — Other Ambulatory Visit: Payer: Self-pay

## 2015-10-03 DIAGNOSIS — F411 Generalized anxiety disorder: Secondary | ICD-10-CM

## 2015-10-04 MED ORDER — ALPRAZOLAM 1 MG PO TABS: ORAL_TABLET | ORAL | Status: DC

## 2015-10-04 NOTE — Telephone Encounter (Signed)
PT left VM requesting a refill of xanax. Pt was written 30 with 1 refill. Please advise

## 2015-10-18 ENCOUNTER — Other Ambulatory Visit: Payer: Self-pay

## 2015-10-18 MED ORDER — TRAMADOL HCL 50 MG PO TABS: 50.0000 mg | ORAL_TABLET | Freq: Three times a day (TID) | ORAL | Status: DC

## 2015-10-18 NOTE — Telephone Encounter (Signed)
Sent in tramadol prescription.

## 2015-11-26 ENCOUNTER — Ambulatory Visit: Payer: 59 | Admitting: Family Medicine

## 2015-11-28 ENCOUNTER — Encounter: Payer: Self-pay | Admitting: Sports Medicine

## 2015-11-28 ENCOUNTER — Ambulatory Visit (INDEPENDENT_AMBULATORY_CARE_PROVIDER_SITE_OTHER): Payer: 59 | Admitting: Sports Medicine

## 2015-11-28 VITALS — BP 126/87 | HR 105 | Resp 18 | Wt 248.2 lb

## 2015-11-28 DIAGNOSIS — G4700 Insomnia, unspecified: Secondary | ICD-10-CM | POA: Diagnosis not present

## 2015-11-28 DIAGNOSIS — M25562 Pain in left knee: Secondary | ICD-10-CM

## 2015-11-28 DIAGNOSIS — F411 Generalized anxiety disorder: Secondary | ICD-10-CM | POA: Diagnosis not present

## 2015-11-28 DIAGNOSIS — I10 Essential (primary) hypertension: Secondary | ICD-10-CM

## 2015-11-28 MED ORDER — ZALEPLON 10 MG PO CAPS: 10.0000 mg | ORAL_CAPSULE | Freq: Every evening | ORAL | Status: DC | PRN

## 2015-11-28 NOTE — Progress Notes (Signed)
  Subjective:    CC: follow-up  HPI: Anxiety: Doing well with Xanax  Left knee pain with intra-articular loose body: We have been trying to get him into the operating room for 4 years now, he has initially been using oral analgesics and narcotics, more recently we have dropped him back down to tramadol in the hopes of adding incentive to proceed with operative intervention. Today he is asking for additional narcotics.  Insomnia: Did well with Sonata 10 mg. Needs a refill.  Past medical history, Surgical history, Family history not pertinant except as noted below, Social history, Allergies, and medications have been entered into the medical record, reviewed, and no changes needed.   Review of Systems: No fevers, chills, night sweats, weight loss, chest pain, or shortness of breath.   Objective:    General: Well Developed, well nourished, and in no acute distress.  Neuro: Alert and oriented x3, extra-ocular muscles intact, sensation grossly intact.  HEENT: Normocephalic, atraumatic, pupils equal round reactive to light, neck supple, no masses, no lymphadenopathy, thyroid nonpalpable.  Skin: Warm and dry, no rashes. Cardiac: Regular rate and rhythm, no murmurs rubs or gallops, no lower extremity edema.  Respiratory: Clear to auscultation bilaterally. Not using accessory muscles, speaking in full sentences.  Impression and Recommendations:   I spent 25 minutes with this patient, greater than 50% was face-to-face time counseling regarding the above diagnoses

## 2015-11-28 NOTE — Assessment & Plan Note (Signed)
Patient under stands no narcotics, he begged for them today. For 4 years now we been trying to get him to go in for an arthroscopy.

## 2015-11-28 NOTE — Assessment & Plan Note (Signed)
Doing well, no changes needed. 

## 2015-11-28 NOTE — Assessment & Plan Note (Signed)
Normal, no changes needed.

## 2015-11-28 NOTE — Assessment & Plan Note (Signed)
Refilling medication

## 2015-12-05 ENCOUNTER — Ambulatory Visit (INDEPENDENT_AMBULATORY_CARE_PROVIDER_SITE_OTHER): Payer: 59 | Admitting: Orthopaedic Surgery

## 2015-12-05 ENCOUNTER — Ambulatory Visit (INDEPENDENT_AMBULATORY_CARE_PROVIDER_SITE_OTHER): Payer: 59

## 2015-12-05 ENCOUNTER — Encounter: Payer: Self-pay | Admitting: Orthopaedic Surgery

## 2015-12-05 VITALS — BP 146/96 | HR 81 | Temp 98.6°F | Resp 16 | Ht 71.0 in | Wt 248.0 lb

## 2015-12-05 DIAGNOSIS — M25562 Pain in left knee: Secondary | ICD-10-CM

## 2015-12-05 MED ORDER — HYDROCODONE-ACETAMINOPHEN 7.5-325 MG PO TABS: 1.0000 | ORAL_TABLET | ORAL | Status: DC | PRN

## 2015-12-05 MED ORDER — NAPROXEN 500 MG PO TABS: 500.0000 mg | ORAL_TABLET | Freq: Two times a day (BID) | ORAL | Status: DC

## 2015-12-05 MED ORDER — TRAMADOL HCL 50 MG PO TABS: 50.0000 mg | ORAL_TABLET | Freq: Four times a day (QID) | ORAL | Status: DC | PRN

## 2015-12-06 NOTE — Progress Notes (Signed)
Subjective: my left knee hurts    Patient ID: Philip Cuevas, male    DOB: [birth year], 35 y.o.   MRN: [id number]  Knee Pain  The injury mechanism was a twisting injury. The pain is present in the left knee. The quality of the pain is described as aching. The pain is at a severity of 5/10. The pain is moderate. The pain has been worsening since onset. Associated symptoms include a loss of motion. Pertinent negatives include no inability to bear weight, loss of sensation, muscle weakness, numbness or tingling. The symptoms are aggravated by weight bearing. He has tried heat, acetaminophen, ice, NSAIDs, immobilization, non-weight bearing and rest for the symptoms. The treatment provided mild relief.   He has had pain in the left knee for many years.  He injured the knee in high school and again later.  He has popping and swelling.  He has giving way of the knee.  He has instability.  He has been putting off seeing anyone for it but because it is getting worse and worse he has decided to seek care at this time.  He has used ice, heat, rest, Mobic, pain medicine with little help.  He has no new trauma.  He has no redness.   Review of Systems  Constitutional:       He smokes.  He has hypertension.  HENT: Negative for congestion.   Respiratory: Negative for cough and shortness of breath.   Cardiovascular: Negative for chest pain and leg swelling.  Endocrine: Negative for cold intolerance.  Musculoskeletal: Positive for joint swelling, arthralgias and gait problem.  Allergic/Immunologic: Negative for environmental allergies.  Neurological: Negative for tingling and numbness.  Psychiatric/Behavioral: The patient is nervous/anxious.    Past Medical History  Diagnosis Date  . Knee injury   . Anxiety   . Depression   . Asthma     mild, intermittent  . Hypertension    Past Surgical History  Procedure Laterality Date  . Tonsilectomy, adenoidectomy, bilateral myringotomy and tubes    .  Dental surgery    . Pilonidal cyst excision N/A 04/12/2015    Procedure: CYST EXCISION PILONIDAL SIMPLE;  Surgeon: Franky Macho, MD;  Location: [facility];  Service: General;  Laterality: N/A;     Social History   Social History  . Marital Status: Married    Spouse Name: N/A  . Number of Children: N/A  . Years of Education: N/A   Occupational History  . Not on file.   Social History Main Topics  . Smoking status: Current Some Day Smoker -- 0.30 packs/day for 4 years    Types: Cigarettes    Last Attempt to Quit: 10/22/2005  . Smokeless tobacco: Not on file  . Alcohol Use: Yes     Comment: 2 drinks a day  . Drug Use: No  . 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 Not on file   Social History Narrative   BP 146/96 mmHg  Pulse 81  Temp(Src) 98.6 F (37 C)  Resp 16  Ht  (1.803 m)  Wt 248 lb (112.492 kg)  BMI 34.60 kg/m2       Objective:   Physical Exam  Constitutional: He is oriented to person, place, and time. He appears well-developed and well-nourished.  HENT:  Head: Normocephalic and atraumatic.  Eyes: Conjunctivae and EOM  are normal. Pupils are equal, round, and reactive to light.  Neck: Normal range of motion. Neck supple.  Cardiovascular: Normal rate, regular rhythm and intact distal pulses.   Pulmonary/Chest: Effort normal.  Abdominal: Soft.  Musculoskeletal: He exhibits tenderness (Left knee pain, medial pain, positive medial Mcmurray, weakly positive anterior drawer, mild effusion.).       Left knee: He exhibits decreased range of motion and effusion. Tenderness found. Medial joint line tenderness noted.       Legs: Neurological: He is alert and oriented to person, place, and time. He has normal reflexes. No cranial nerve deficit. He exhibits normal muscle tone. Coordination normal.  Skin: Skin is warm and dry.  Psychiatric: He has a normal  mood and affect. His behavior is normal. Judgment and thought content normal.   Right Knee Exam  Right knee exam is normal.  Muscle Strength   The patient has normal right knee strength.   Left Knee Exam   Tenderness  The patient is experiencing tenderness in the MCL and medial joint line.  Range of Motion  Extension:  5 abnormal  Flexion:  100 abnormal   Muscle Strength   The patient has normal left knee strength.  Tests  McMurray:  Medial - positive  Lachman:  Anterior - trace     Drawer:       Anterior - trace      Varus: negative Valgus: negative Patellar Apprehension: negative  Other  Erythema: absent Sensation: normal Pulse: present Swelling: mild Effusion: effusion present     X-rays were done of the left knee, reported separately     Assessment & Plan:   Encounter Diagnosis  Name Primary?  . Left knee pain Yes   He has loss of full extension, instability with positive medial McMurray and weakly positive anterior drawer sign, no improvement with Mobic, rest, ice, heat.  I feel he needs a MRI of the knee.  It is ordered.    He has run out of Mobic.  I will stop that and change to Naprosyn.  He was given pain medicine as well.  Call if any problem.  Precautions discussed.  Await approval of MRI.

## 2015-12-06 NOTE — Patient Instructions (Signed)
Get MRI of the left knee. 

## 2015-12-09 ENCOUNTER — Other Ambulatory Visit: Payer: Self-pay

## 2015-12-09 DIAGNOSIS — F411 Generalized anxiety disorder: Secondary | ICD-10-CM

## 2015-12-09 MED ORDER — ALPRAZOLAM 1 MG PO TABS
ORAL_TABLET | ORAL | Status: DC
Start: 2015-12-09 — End: 2016-01-14

## 2015-12-10 ENCOUNTER — Ambulatory Visit
Admission: RE | Admit: 2015-12-10 | Discharge: 2015-12-10 | Disposition: A | Discharge status: Home / Self Care | Payer: 59 | Source: Ambulatory Visit | Attending: Orthopaedic Surgery | Admitting: Orthopaedic Surgery

## 2015-12-10 ENCOUNTER — Other Ambulatory Visit: Payer: Self-pay | Admitting: Orthopaedic Surgery

## 2015-12-10 DIAGNOSIS — M25562 Pain in left knee: Secondary | ICD-10-CM

## 2015-12-11 ENCOUNTER — Ambulatory Visit (INDEPENDENT_AMBULATORY_CARE_PROVIDER_SITE_OTHER): Payer: 59 | Admitting: Orthopaedic Surgery

## 2015-12-11 ENCOUNTER — Encounter: Payer: Self-pay | Admitting: Orthopaedic Surgery

## 2015-12-11 VITALS — BP 112/75 | HR 105 | Temp 97.7°F | Resp 16 | Ht 71.0 in | Wt 248.0 lb

## 2015-12-11 DIAGNOSIS — M25562 Pain in left knee: Secondary | ICD-10-CM

## 2015-12-11 NOTE — Progress Notes (Signed)
Patient ZO:XWRUEAVW:Philip Cuevas, male DOB:04/18/1981, 35 y.o. UJW:[id number]  Chief Complaint  Patient presents with  . Results    review MRI left knee    HPI  Philip Cuevas is a 35 y.o. male who has left knee pain.  He had a MRI done 12-10-15 [facility] of the left knee.  The report of the MRI states: 1.  Intact ligamentous structures and no acute bony findings. 2.  No meniscal tears. 3. Chondromalacia patella. 4.  No joint effusion or synovitis. 5.  Small dissecting Baker's cyst.  I have gone over the findings with him and used a model of the knee.  I have also given him a report of the MRI as well.   His concern is that his left knee hurts all the time.  He has bad days and worse days.  He has no new trauma.  I have mentioned visco supplementation.  I have also mentioned another opinion at one of the nearby medical schools/universities.  He will consider this.  He is to continue the current Naprosyn and pain medicine. HPI  Body mass index is 34.6 kg/(m^2).  Review of Systems  Constitutional:       He smokes.  He has hypertension.  HENT: Negative for congestion.   Respiratory: Negative for cough and shortness of breath.   Cardiovascular: Negative for chest pain and leg swelling.  Endocrine: Negative for cold intolerance.  Musculoskeletal: Positive for joint swelling, arthralgias and gait problem.  Allergic/Immunologic: Negative for environmental allergies.  Neurological: Negative for numbness.  Psychiatric/Behavioral: The patient is nervous/anxious.     Past Medical History  Diagnosis Date  . Knee injury   . Anxiety   . Depression   . Asthma     mild, intermittent  . Hypertension     Past Surgical History  Procedure Laterality Date  . Tonsilectomy, adenoidectomy, bilateral myringotomy and tubes    . Dental surgery    . Pilonidal cyst excision N/A 04/12/2015    Procedure: CYST EXCISION PILONIDAL SIMPLE;  Surgeon: Philip MachoMark Jenkins, MD;  Location: [facility];   Service: General;  Laterality: N/A;    Family History  Problem Relation Age of Onset  . Hypertension Mother   . Hyperlipidemia Mother   . Depression Father     committed suicide    Social History Social History  Substance Use Topics  . Smoking status: Current Some Day Smoker -- 0.30 packs/day for 4 years    Types: Cigarettes    Last Attempt to Quit: 10/22/2005  . Smokeless tobacco: None  . Alcohol Use: Yes     Comment: 2 drinks a day    No Known Allergies  Current Outpatient Prescriptions  Medication Sig Dispense Refill  . acetaminophen (TYLENOL) 325 MG tablet Take 650 mg by mouth every 6 (six) hours as needed.    Marland Kitchen. albuterol (VENTOLIN HFA) 108 (90 BASE) MCG/ACT inhaler Inhale 2 puffs into the lungs every 6 (six) hours as needed.      . ALPRAZolam (XANAX) 1 MG tablet TAKE 1 TABLET BY MOUTH 3 TIMES A DAY AS NEEDED FOR ANXIETY 30 tablet 1  . amLODipine (NORVASC) 5 MG tablet TAKE 1 TABLET BY MOUTH EVERY DAY 30 tablet 6  . BENICAR HCT 40-25 MG tablet TAKE 1 TABLET BY MOUTH DAILY 30 tablet 0  . CIALIS 20 MG tablet TAKE 1 TABLET BY MOUTH EVERY DAY AS NEEDED FOR ERECTILE DYSFUNCTION 6 tablet 8  . cyclobenzaprine (FLEXERIL) 10 MG tablet Take 1  tablet (10 mg total) by mouth 3 (three) times daily as needed for muscle spasms. 90 tablet 0  . gabapentin (NEURONTIN) 300 MG capsule One tab PO qHS for a week, then BID for a week, then TID. May double weekly to a max of 3,600mg /day 180 capsule 3  . Glucosamine-Chondroitin ([facility] GLUCOSAMINE-CHONDROITIN) 750-600 MG CHEW Chew 2 tablets by mouth 2 (two) times daily.    Marland Kitchen HYDROcodone-acetaminophen (NORCO) 7.5-325 MG tablet Take 1 tablet by mouth every 4 (four) hours as needed for moderate pain (Must last 30 days.  Do not drive or operate machinery while taking this medicine.). 120 tablet 0  . naproxen (NAPROSYN) 500 MG tablet Take 1 tablet (500 mg total) by mouth 2 (two) times daily with a meal. 60 tablet 5  . oxyCODONE-acetaminophen (PERCOCET)  7.5-325 MG per tablet Take 1-2 tablets by mouth every 4 (four) hours as needed. 50 tablet 0  . promethazine (PHENERGAN) 25 MG tablet TAKE 1 TABLET BY MOUTH EVERY 6 HOURS AS NEEDED FOR NAUSEA 30 tablet 0  . zaleplon (SONATA) 10 MG capsule Take 1 capsule (10 mg total) by mouth at bedtime as needed for sleep. 30 capsule 3   No current facility-administered medications for this visit.     Physical Exam  Blood pressure 112/75, pulse 105, temperature 97.7 F (36.5 C), resp. rate 16, height  (1.803 m), weight 248 lb (112.492 kg).  Constitutional: overall normal hygiene, normal nutrition, well developed, normal grooming, normal body habitus. Assistive device:none  Musculoskeletal: gait and station Limp left, muscle tone and strength are normal, no tremors or atrophy is present.  .  Neurological: coordination overall normal.  Deep tendon reflex/nerve stretch intact.  Sensation normal.  Cranial nerves II-XII intact.   Skin:   normal overall no scars, lesions, ulcers or rashes. No psoriasis.  Psychiatric: Alert and oriented x 3.  Recent memory intact, remote memory unclear.  Normal mood and affect. Well groomed.  Good eye contact.  Cardiovascular: overall no swelling, no varicosities, no edema bilaterally, normal temperatures of the legs and arms, no clubbing, cyanosis and good capillary refill.  Lymphatic: palpation is normal.  The left lower extremity is examined:  Inspection:  Thigh:  Non-tender and no defects  Knee has swelling 1+ effusion.                        Joint tenderness is present                        Patient is tender over the medial joint line  Lower Leg:  Has normal appearance and no tenderness or defects  Ankle:  Non-tender and no defects  Foot:  Non-tender and no defects Range of Motion:  Knee:  Range of motion is: 0-105                        Crepitus is  present  Ankle:  Range of motion is normal. Strength and Tone:  The left lower extremity has normal  strength and tone. Stability:  Knee:  The knee is stable.  Ankle:  The ankle is stable.      The patient has been educated about the nature of the problem(s) and counseled on treatment options.  The patient appeared to understand what I have discussed and is in agreement with it.  Encounter Diagnosis  Name Primary?  . Left knee pain Yes    PLAN  Call if any problems.  Precautions discussed.  Continue current medications.   Return to clinic 1 month   He will research with his insurance carrier about possible visco supplementation for the knee and see what they will pay.  He is to continue the Naprosyn.

## 2015-12-11 NOTE — Patient Instructions (Addendum)
Continue NSAIDS and ice the knee   Generic Knee Exercises EXERCISES RANGE OF MOTION (ROM) AND STRETCHING EXERCISES These exercises may help you when beginning to rehabilitate your injury. Your symptoms may resolve with or without further involvement from your physician, physical therapist, or athletic trainer. While completing these exercises, remember:   Restoring tissue flexibility helps normal motion to return to the joints. This allows healthier, less painful movement and activity.  An effective stretch should be held for at least 30 seconds.  A stretch should never be painful. You should only feel a gentle lengthening or release in the stretched tissue. STRETCH - Knee Extension, Prone  Lie on your stomach on a firm surface, such as a bed or countertop. Place your right / left knee and leg just beyond the edge of the surface. You may wish to place a towel under the far end of your right / left thigh for comfort.  Relax your leg muscles and allow gravity to straighten your knee. Your clinician may advise you to add an ankle weight if more resistance is helpful for you.  You should feel a stretch in the back of your right / left knee. Hold this position for __________ seconds. Repeat __________ times. Complete this stretch __________ times per day. * Your physician, physical therapist, or athletic trainer may ask you to add ankle weight to enhance your stretch.  RANGE OF MOTION - Knee Flexion, Active  Lie on your back with both knees straight. (If this causes back discomfort, bend your opposite knee, placing your foot flat on the floor.)  Slowly slide your heel back toward your buttocks until you feel a gentle stretch in the front of your knee or thigh.  Hold for __________ seconds. Slowly slide your heel back to the starting position. Repeat __________ times. Complete this exercise __________ times per day.  STRETCH - Quadriceps, Prone   Lie on your stomach on a firm surface, such  as a bed or padded floor.  Bend your right / left knee and grasp your ankle. If you are unable to reach your ankle or pant leg, use a belt around your foot to lengthen your reach.  Gently pull your heel toward your buttocks. Your knee should not slide out to the side. You should feel a stretch in the front of your thigh and/or knee.  Hold this position for __________ seconds. Repeat __________ times. Complete this stretch __________ times per day.  STRETCH - Hamstrings, Supine   Lie on your back. Loop a belt or towel over the ball of your right / left foot.  Straighten your right / left knee and slowly pull on the belt to raise your leg. Do not allow the right / left knee to bend. Keep your opposite leg flat on the floor.  Raise the leg until you feel a gentle stretch behind your right / left knee or thigh. Hold this position for __________ seconds. Repeat __________ times. Complete this stretch __________ times per day.  STRENGTHENING EXERCISES These exercises may help you when beginning to rehabilitate your injury. They may resolve your symptoms with or without further involvement from your physician, physical therapist, or athletic trainer. While completing these exercises, remember:   Muscles can gain both the endurance and the strength needed for everyday activities through controlled exercises.  Complete these exercises as instructed by your physician, physical therapist, or athletic trainer. Progress the resistance and repetitions only as guided.  You may experience muscle soreness or fatigue, but  the pain or discomfort you are trying to eliminate should never worsen during these exercises. If this pain does worsen, stop and make certain you are following the directions exactly. If the pain is still present after adjustments, discontinue the exercise until you can discuss the trouble with your clinician. STRENGTH - Quadriceps, Isometrics  Lie on your back with your right / left leg  extended and your opposite knee bent.  Gradually tense the muscles in the front of your right / left thigh. You should see either your knee cap slide up toward your hip or increased dimpling just above the knee. This motion will push the back of the knee down toward the floor/mat/bed on which you are lying.  Hold the muscle as tight as you can without increasing your pain for __________ seconds.  Relax the muscles slowly and completely in between each repetition. Repeat __________ times. Complete this exercise __________ times per day.  STRENGTH - Quadriceps, Short Arcs   Lie on your back. Place a __________ inch towel roll under your knee so that the knee slightly bends.  Raise only your lower leg by tightening the muscles in the front of your thigh. Do not allow your thigh to rise.  Hold this position for __________ seconds. Repeat __________ times. Complete this exercise __________ times per day.  OPTIONAL ANKLE WEIGHTS: Begin with ____________________, but DO NOT exceed ____________________. Increase in 1 pound/0.5 kilogram increments.  STRENGTH - Quadriceps, Straight Leg Raises  Quality counts! Watch for signs that the quadriceps muscle is working to insure you are strengthening the correct muscles and not "cheating" by substituting with healthier muscles.  Lay on your back with your right / left leg extended and your opposite knee bent.  Tense the muscles in the front of your right / left thigh. You should see either your knee cap slide up or increased dimpling just above the knee. Your thigh may even quiver.  Tighten these muscles even more and raise your leg 4 to 6 inches off the floor. Hold for __________ seconds.  Keeping these muscles tense, lower your leg.  Relax the muscles slowly and completely in between each repetition. Repeat __________ times. Complete this exercise __________ times per day.  STRENGTH - Hamstring, Curls  Lay on your stomach with your legs extended.  (If you lay on a bed, your feet may hang over the edge.)  Tighten the muscles in the back of your thigh to bend your right / left knee up to 90 degrees. Keep your hips flat on the bed/floor.  Hold this position for __________ seconds.  Slowly lower your leg back to the starting position. Repeat __________ times. Complete this exercise __________ times per day.  OPTIONAL ANKLE WEIGHTS: Begin with ____________________, but DO NOT exceed ____________________. Increase in 1 pound/0.5 kilogram increments.  STRENGTH - Quadriceps, Squats  Stand in a door frame so that your feet and knees are in line with the frame.  Use your hands for balance, not support, on the frame.  Slowly lower your weight, bending at the hips and knees. Keep your lower legs upright so that they are parallel with the door frame. Squat only within the range that does not increase your knee pain. Never let your hips drop below your knees.  Slowly return upright, pushing with your legs, not pulling with your hands. Repeat __________ times. Complete this exercise __________ times per day.  STRENGTH - Quadriceps, Wall Slides  Follow guidelines for form closely. Increased knee pain often  results from poorly placed feet or knees.  Lean against a smooth wall or door and walk your feet out 18-24 inches. Place your feet hip-width apart.  Slowly slide down the wall or door until your knees bend __________ degrees.* Keep your knees over your heels, not your toes, and in line with your hips, not falling to either side.  Hold for __________ seconds. Stand up to rest for __________ seconds in between each repetition. Repeat __________ times. Complete this exercise __________ times per day. * Your physician, physical therapist, or athletic trainer will alter this angle based on your symptoms and progress.   This information is not intended to replace advice given to you by your health care provider. Make sure you discuss any questions  you have with your health care provider.   Document Released: 06/24/2005 Document Revised: 08/31/2014 Document Reviewed: 11/22/2008 Elsevier Interactive Patient Education [employer]

## 2016-01-01 ENCOUNTER — Telehealth: Payer: Self-pay | Admitting: Orthopaedic Surgery

## 2016-01-01 MED ORDER — HYDROCODONE-ACETAMINOPHEN 7.5-325 MG PO TABS: 1.0000 | ORAL_TABLET | ORAL | Status: DC | PRN

## 2016-01-01 NOTE — Telephone Encounter (Signed)
Rx done. 

## 2016-01-01 NOTE — Telephone Encounter (Signed)
Hydrocodone-Acetaminophen 7.5/325mg   Qty 120 Tablets  Patient is asking if he could get something stronger.

## 2016-01-14 ENCOUNTER — Other Ambulatory Visit: Payer: Self-pay

## 2016-01-14 ENCOUNTER — Telehealth: Payer: Self-pay

## 2016-01-14 DIAGNOSIS — F411 Generalized anxiety disorder: Secondary | ICD-10-CM

## 2016-01-14 MED ORDER — ALPRAZOLAM 1 MG PO TABS: ORAL_TABLET | ORAL | Status: DC

## 2016-01-14 NOTE — Telephone Encounter (Signed)
Ok

## 2016-01-14 NOTE — Telephone Encounter (Signed)
Faxed to pharmacy

## 2016-02-03 ENCOUNTER — Telehealth: Payer: Self-pay | Admitting: Orthopaedic Surgery

## 2016-02-03 NOTE — Telephone Encounter (Signed)
Patient called for refill of HYDROcodone-acetaminophen (NORCO) 7.5-325 MG tablet [[id number]][[id number]] - quantity 120; also inquiring about whether it is possible to be issued the same strength of medication in Oxycodone; states mentioned this information at time of last refill.  I relayed to patient that Dr Hilda LiasKeeling would have carefully reviewed the refill request, and would have prescribed the appropriate medication and dose, as well as complying with the law.

## 2016-02-04 MED ORDER — HYDROCODONE-ACETAMINOPHEN 7.5-325 MG PO TABS: 1.0000 | ORAL_TABLET | ORAL | Status: DC | PRN

## 2016-02-04 NOTE — Telephone Encounter (Signed)
Rx done. 

## 2016-02-21 ENCOUNTER — Telehealth: Payer: Self-pay | Admitting: *Deleted

## 2016-02-21 NOTE — Telephone Encounter (Signed)
Pt.notified

## 2016-02-21 NOTE — Telephone Encounter (Signed)
Pt is requesting a refill of xanax.the directions state to take three times a day AS NEEDED for anxiety. Pt last filled the rx on 01/29/2016. The rx is to last 1 month per Dr. Teressa Lower. Called the patient and told him the earliest we would send in the prescription would be in a week. Patient states he is about to travel out of town and needs his medication filled asap.  He would like this refilled today. Routing to provider for review

## 2016-02-21 NOTE — Telephone Encounter (Signed)
I'm happy to write it as needed but he knows that we don't do early refills.

## 2016-02-28 ENCOUNTER — Other Ambulatory Visit: Payer: Self-pay

## 2016-02-28 DIAGNOSIS — F411 Generalized anxiety disorder: Secondary | ICD-10-CM

## 2016-02-28 MED ORDER — ALPRAZOLAM 1 MG PO TABS: ORAL_TABLET | ORAL | Status: DC

## 2016-03-04 ENCOUNTER — Telehealth: Payer: Self-pay | Admitting: Orthopaedic Surgery

## 2016-03-04 NOTE — Telephone Encounter (Signed)
Patient requests refill on: HYDROcodone-acetaminophen (NORCO) 7.5-325 MG tablet [[id number]][[id number]] - quantity 120.

## 2016-03-05 MED ORDER — HYDROCODONE-ACETAMINOPHEN 7.5-325 MG PO TABS: 1.0000 | ORAL_TABLET | ORAL | Status: DC | PRN

## 2016-03-05 NOTE — Telephone Encounter (Signed)
Rx done. 

## 2016-04-06 ENCOUNTER — Telehealth: Payer: Self-pay | Admitting: Orthopaedic Surgery

## 2016-04-06 MED ORDER — HYDROCODONE-ACETAMINOPHEN 7.5-325 MG PO TABS: 1.0000 | ORAL_TABLET | ORAL | 0 refills | Status: DC | PRN

## 2016-04-06 NOTE — Telephone Encounter (Signed)
Patient requests refill of medication: HYDROcodone-acetaminophen (NORCO) 7.5-325 MG tablet [[id number]][[id number]] - quantity 120 - relayed he is due for follow up appointment, and offered this week.  States has busy work schedule this week, and made appointment for next Tuesday, 04/14/16. Please advise.

## 2016-04-14 ENCOUNTER — Ambulatory Visit: Payer: 59 | Admitting: Orthopaedic Surgery

## 2016-04-24 ENCOUNTER — Other Ambulatory Visit: Payer: Self-pay

## 2016-04-24 DIAGNOSIS — F411 Generalized anxiety disorder: Secondary | ICD-10-CM

## 2016-04-24 MED ORDER — ALPRAZOLAM 1 MG PO TABS: ORAL_TABLET | ORAL | 1 refills | Status: DC

## 2016-04-28 ENCOUNTER — Ambulatory Visit: Payer: 59 | Admitting: Orthopaedic Surgery

## 2016-04-29 ENCOUNTER — Other Ambulatory Visit: Payer: Self-pay | Admitting: Sports Medicine

## 2016-04-29 DIAGNOSIS — F411 Generalized anxiety disorder: Secondary | ICD-10-CM

## 2016-05-06 ENCOUNTER — Ambulatory Visit (INDEPENDENT_AMBULATORY_CARE_PROVIDER_SITE_OTHER): Payer: 59 | Admitting: Orthopaedic Surgery

## 2016-05-06 ENCOUNTER — Encounter: Payer: Self-pay | Admitting: Orthopaedic Surgery

## 2016-05-06 VITALS — BP 141/95 | HR 115 | Temp 97.9°F | Ht 70.0 in | Wt 251.0 lb

## 2016-05-06 DIAGNOSIS — M25562 Pain in left knee: Secondary | ICD-10-CM | POA: Diagnosis not present

## 2016-05-06 MED ORDER — HYDROCODONE-ACETAMINOPHEN 7.5-325 MG PO TABS: 1.0000 | ORAL_TABLET | ORAL | 0 refills | Status: DC | PRN

## 2016-05-06 MED ORDER — DICLOFENAC SODIUM 75 MG PO TBEC: 75.0000 mg | DELAYED_RELEASE_TABLET | Freq: Two times a day (BID) | ORAL | 2 refills | Status: DC

## 2016-05-06 NOTE — Progress Notes (Signed)
Patient ZO:XWRUEAVW:Philip Cuevas, male DOB:12/12/1980, 35 y.o. UJW:[id number]  Chief Complaint  Patient presents with  . Follow-up    left knee    HPI  Philip Cuevas is a 35 y.o. male who has chronic pain of the left knee.  He was walking with his wife last month and their dog suddenly moved a different way and he twisted his knee again.  He has medial pain.  He has no giving way.  He has had some swelling but it is decreased.  He has no locking.  He has used ice and rest.  He takes Naprosyn.  I will stop the Naprosyn and begin diclofenac now. HPI  Body mass index is 36.01 kg/m.  ROS  Review of Systems  Constitutional:       He smokes.  He has hypertension.  HENT: Negative for congestion.   Respiratory: Negative for cough and shortness of breath.   Cardiovascular: Negative for chest pain and leg swelling.  Endocrine: Negative for cold intolerance.  Musculoskeletal: Positive for arthralgias, gait problem and joint swelling.  Allergic/Immunologic: Negative for environmental allergies.  Neurological: Negative for numbness.  Psychiatric/Behavioral: The patient is nervous/anxious.     Past Medical History:  Diagnosis Date  . Anxiety   . Asthma    mild, intermittent  . Depression   . Hypertension   . Knee injury     Past Surgical History:  Procedure Laterality Date  . DENTAL SURGERY    . PILONIDAL CYST EXCISION N/A 04/12/2015   Procedure: CYST EXCISION PILONIDAL SIMPLE;  Surgeon: Franky MachoMark Jenkins, MD;  Location: [facility];  Service: General;  Laterality: N/A;  . TONSILECTOMY, ADENOIDECTOMY, BILATERAL MYRINGOTOMY AND TUBES      Family History  Problem Relation Age of Onset  . Hypertension Mother   . Hyperlipidemia Mother   . Depression Father     committed suicide    Social History Social History  Substance Use Topics  . Smoking status: Current Some Day Smoker    Packs/day: 0.30    Years: 4.00    Types: Cigarettes    Last attempt to quit: 10/22/2005  . Smokeless  tobacco: Not on file  . Alcohol use Yes     Comment: 2 drinks a day    No Known Allergies  Current Outpatient Prescriptions  Medication Sig Dispense Refill  . acetaminophen (TYLENOL) 325 MG tablet Take 650 mg by mouth every 6 (six) hours as needed.    Marland Kitchen. albuterol (VENTOLIN HFA) 108 (90 BASE) MCG/ACT inhaler Inhale 2 puffs into the lungs every 6 (six) hours as needed.      . ALPRAZolam (XANAX) 1 MG tablet TAKE 1 TABLET BY MOUTH THREE TIMES DAILY AS NEEDED FOR ANXIETY 30 tablet 0  . amLODipine (NORVASC) 5 MG tablet TAKE 1 TABLET BY MOUTH EVERY DAY 30 tablet 6  . BENICAR HCT 40-25 MG tablet TAKE 1 TABLET BY MOUTH DAILY 30 tablet 0  . CIALIS 20 MG tablet TAKE 1 TABLET BY MOUTH EVERY DAY AS NEEDED FOR ERECTILE DYSFUNCTION 6 tablet 8  . cyclobenzaprine (FLEXERIL) 10 MG tablet Take 1 tablet (10 mg total) by mouth 3 (three) times daily as needed for muscle spasms. 90 tablet 0  . gabapentin (NEURONTIN) 300 MG capsule One tab PO qHS for a week, then BID for a week, then TID. May double weekly to a max of 3,600mg /day 180 capsule 3  . Glucosamine-Chondroitin ([facility] GLUCOSAMINE-CHONDROITIN) 750-600 MG CHEW Chew 2 tablets by mouth 2 (two) times daily.    .Marland Kitchen  HYDROcodone-acetaminophen (NORCO) 7.5-325 MG tablet Take 1 tablet by mouth every 4 (four) hours as needed for moderate pain (Must last 30 days.  Do not drive or operate machinery while taking this medicine.). 120 tablet 0  . promethazine (PHENERGAN) 25 MG tablet TAKE 1 TABLET BY MOUTH EVERY 6 HOURS AS NEEDED FOR NAUSEA 30 tablet 0  . zaleplon (SONATA) 10 MG capsule Take 1 capsule (10 mg total) by mouth at bedtime as needed for sleep. 30 capsule 3  . diclofenac (VOLTAREN) 75 MG EC tablet Take 1 tablet (75 mg total) by mouth 2 (two) times daily with a meal. 60 tablet 2   No current facility-administered medications for this visit.      Physical Exam  Blood pressure (!) 141/95, pulse (!) 115, temperature 97.9 F (36.6 C), height 5\' 10"  (1.778 m), weight  251 lb (113.9 kg).  Constitutional: overall normal hygiene, normal nutrition, well developed, normal grooming, normal body habitus. Assistive device:none  Musculoskeletal: gait and station Limp left, muscle tone and strength are normal, no tremors or atrophy is present.  .  Neurological: coordination overall normal.  Deep tendon reflex/nerve stretch intact.  Sensation normal.  Cranial nerves II-XII intact.   Skin:   Normal overall no scars, lesions, ulcers or rashes. No psoriasis.  Psychiatric: Alert and oriented x 3.  Recent memory intact, remote memory unclear.  Normal mood and affect. Well groomed.  Good eye contact.  Cardiovascular: overall no swelling, no varicosities, no edema bilaterally, normal temperatures of the legs and arms, no clubbing, cyanosis and good capillary refill.  Lymphatic: palpation is normal.  The left lower extremity is examined:  Inspection:  Thigh:  Non-tender and no defects  Knee has swelling 1/2+ effusion.                        Joint tenderness is present                        Patient is tender over the medial joint line  Lower Leg:  Has normal appearance and no tenderness or defects  Ankle:  Non-tender and no defects  Foot:  Non-tender and no defects Range of Motion:  Knee:  Range of motion is: 0-115                        Crepitus is  present  Ankle:  Range of motion is normal. Strength and Tone:  The left lower extremity has normal strength and tone. Stability:  Knee:  The knee is stable.  Ankle:  The ankle is stable.    The patient has been educated about the nature of the problem(s) and counseled on treatment options.  The patient appeared to understand what I have discussed and is in agreement with it.  Encounter Diagnosis  Name Primary?  . Left knee pain Yes    PLAN Call if any problems.  Precautions discussed.  Continue current medications.   Return to clinic 3 months   Electronically Signed Darreld Mclean, MD 9/13/20173:10  PM

## 2016-05-06 NOTE — Patient Instructions (Signed)
Smoking Cessation, Tips for Success If you are ready to quit smoking, congratulations! You have chosen to help yourself be healthier. Cigarettes bring nicotine, tar, carbon monoxide, and other irritants into your body. Your lungs, heart, and blood vessels will be able to work better without these poisons. There are many different ways to quit smoking. Nicotine gum, nicotine patches, a nicotine inhaler, or nicotine nasal spray can help with physical craving. Hypnosis, support groups, and medicines help break the habit of smoking. WHAT THINGS CAN I DO TO MAKE QUITTING EASIER?  Here are some tips to help you quit for good:  Pick a date when you will quit smoking completely. Tell all of your friends and family about your plan to quit on that date.  Do not try to slowly cut down on the number of cigarettes you are smoking. Pick a quit date and quit smoking completely starting on that day.  Throw away all cigarettes.   Clean and remove all ashtrays from your home, work, and car.  On a card, write down your reasons for quitting. Carry the card with you and read it when you get the urge to smoke.  Cleanse your body of nicotine. Drink enough water and fluids to keep your urine clear or pale yellow. Do this after quitting to flush the nicotine from your body.  Learn to predict your moods. Do not let a bad situation be your excuse to have a cigarette. Some situations in your life might tempt you into wanting a cigarette.  Never have "just one" cigarette. It leads to wanting another and another. Remind yourself of your decision to quit.  Change habits associated with smoking. If you smoked while driving or when feeling stressed, try other activities to replace smoking. Stand up when drinking your coffee. Brush your teeth after eating. Sit in a different chair when you read the paper. Avoid alcohol while trying to quit, and try to drink fewer caffeinated beverages. Alcohol and caffeine may urge you to  smoke.  Avoid foods and drinks that can trigger a desire to smoke, such as sugary or spicy foods and alcohol.  Ask people who smoke not to smoke around you.  Have something planned to do right after eating or having a cup of coffee. For example, plan to take a walk or exercise.  Try a relaxation exercise to calm you down and decrease your stress. Remember, you may be tense and nervous for the first 2 weeks after you quit, but this will pass.  Find new activities to keep your hands busy. Play with a pen, coin, or rubber band. Doodle or draw things on paper.  Brush your teeth right after eating. This will help cut down on the craving for the taste of tobacco after meals. You can also try mouthwash.   Use oral substitutes in place of cigarettes. Try using lemon drops, carrots, cinnamon sticks, or chewing gum. Keep them handy so they are available when you have the urge to smoke.  When you have the urge to smoke, try deep breathing.  Designate your home as a nonsmoking area.  If you are a heavy smoker, ask your health care provider about a prescription for nicotine chewing gum. It can ease your withdrawal from nicotine.  Reward yourself. Set aside the cigarette money you save and buy yourself something nice.  Look for support from others. Join a support group or smoking cessation program. Ask someone at home or at work to help you with your plan   to quit smoking.  Always ask yourself, "Do I need this cigarette or is this just a reflex?" Tell yourself, "Today, I choose not to smoke," or "I do not want to smoke." You are reminding yourself of your decision to quit.  Do not replace cigarette smoking with electronic cigarettes (commonly called e-cigarettes). The safety of e-cigarettes is unknown, and some may contain harmful chemicals.  If you relapse, do not give up! Plan ahead and think about what you will do the next time you get the urge to smoke. HOW WILL I FEEL WHEN I QUIT SMOKING? You  may have symptoms of withdrawal because your body is used to nicotine (the addictive substance in cigarettes). You may crave cigarettes, be irritable, feel very hungry, cough often, get headaches, or have difficulty concentrating. The withdrawal symptoms are only temporary. They are strongest when you first quit but will go away within 10-14 days. When withdrawal symptoms occur, stay in control. Think about your reasons for quitting. Remind yourself that these are signs that your body is healing and getting used to being without cigarettes. Remember that withdrawal symptoms are easier to treat than the major diseases that smoking can cause.  Even after the withdrawal is over, expect periodic urges to smoke. However, these cravings are generally short lived and will go away whether you smoke or not. Do not smoke! WHAT RESOURCES ARE AVAILABLE TO HELP ME QUIT SMOKING? Your health care provider can direct you to community resources or hospitals for support, which may include:  Group support.  Education.  Hypnosis.  Therapy.   This information is not intended to replace advice given to you by your health care provider. Make sure you discuss any questions you have with your health care provider.   Document Released: 05/08/2004 Document Revised: 08/31/2014 Document Reviewed: 01/26/2013 Elsevier Interactive Patient Education 2016 [employer]  

## 2016-05-08 ENCOUNTER — Other Ambulatory Visit: Payer: Self-pay

## 2016-05-08 DIAGNOSIS — G4700 Insomnia, unspecified: Secondary | ICD-10-CM

## 2016-05-08 MED ORDER — ZALEPLON 10 MG PO CAPS: 10.0000 mg | ORAL_CAPSULE | Freq: Every evening | ORAL | 3 refills | Status: DC | PRN

## 2016-05-22 ENCOUNTER — Other Ambulatory Visit: Payer: Self-pay | Admitting: Sports Medicine

## 2016-06-01 ENCOUNTER — Other Ambulatory Visit: Payer: Self-pay

## 2016-06-01 DIAGNOSIS — F411 Generalized anxiety disorder: Secondary | ICD-10-CM

## 2016-06-01 MED ORDER — ALPRAZOLAM 1 MG PO TABS: 1.0000 mg | ORAL_TABLET | Freq: Three times a day (TID) | ORAL | 2 refills | Status: DC | PRN

## 2016-06-04 ENCOUNTER — Telehealth: Payer: Self-pay | Admitting: Orthopedic Surgery

## 2016-06-04 ENCOUNTER — Telehealth: Payer: Self-pay | Admitting: Orthopaedic Surgery

## 2016-06-04 MED ORDER — HYDROCODONE-ACETAMINOPHEN 7.5-325 MG PO TABS: 1.0000 | ORAL_TABLET | ORAL | 0 refills | Status: DC | PRN

## 2016-06-04 NOTE — Telephone Encounter (Signed)
Hydrocodone-Acetaminophen 7.5/325mg Qty 120 Tablets °

## 2016-06-26 ENCOUNTER — Telehealth: Payer: Self-pay | Admitting: Orthopaedic Surgery

## 2016-06-26 NOTE — Telephone Encounter (Signed)
Patient called to request a note for his employer to request a closer parking space, due to the medical reason of his knee injury; also looking into possible temporary handicapped placard.  Please advise if a note may be issued for parking space. [phone]

## 2016-06-29 NOTE — Telephone Encounter (Signed)
Done.  I filled out temporary handicap application.

## 2016-07-06 ENCOUNTER — Telehealth: Payer: Self-pay | Admitting: Orthopaedic Surgery

## 2016-07-06 ENCOUNTER — Other Ambulatory Visit: Payer: Self-pay | Admitting: *Deleted

## 2016-07-06 MED ORDER — HYDROCODONE-ACETAMINOPHEN 7.5-325 MG PO TABS: 1.0000 | ORAL_TABLET | ORAL | 0 refills | Status: DC | PRN

## 2016-07-06 NOTE — Telephone Encounter (Signed)
Patient of Dr Sanjuan DameKeeling's requests refill, aware requests are being routed for Dr Harrison's approval while Dr Hilda LiasKeeling out of office this week:  HYDROcodone-acetaminophen (NORCO) 7.5-325 MG tablet 110 tablet

## 2016-08-05 ENCOUNTER — Encounter: Payer: Self-pay | Admitting: Orthopaedic Surgery

## 2016-08-05 ENCOUNTER — Ambulatory Visit (INDEPENDENT_AMBULATORY_CARE_PROVIDER_SITE_OTHER): Payer: 59 | Admitting: Orthopaedic Surgery

## 2016-08-05 VITALS — BP 144/99 | HR 99 | Temp 97.7°F | Ht 70.0 in | Wt 250.0 lb

## 2016-08-05 DIAGNOSIS — M25562 Pain in left knee: Secondary | ICD-10-CM

## 2016-08-05 DIAGNOSIS — G8929 Other chronic pain: Secondary | ICD-10-CM

## 2016-08-05 MED ORDER — HYDROCODONE-ACETAMINOPHEN 7.5-325 MG PO TABS: 1.0000 | ORAL_TABLET | ORAL | 0 refills | Status: DC | PRN

## 2016-08-05 NOTE — Progress Notes (Signed)
Patient ZO:XWRUEAVW:Philip Cuevas, male DOB:04/09/1981, 35 y.o. UJW:[id number]  Chief Complaint  Patient presents with  . Follow-up    left knee pain    HPI  Philip Cuevas is a 35 y.o. male who has chronic left knee pain.  He has no new trauma.  He has no giving way or locking.  He has swelling and popping.  He likes to bowl and has some pain after that.  He is taking his medicine. HPI  Body mass index is 35.87 kg/m.  ROS  Review of Systems  Constitutional:       He smokes.  He has hypertension.  HENT: Negative for congestion.   Respiratory: Negative for cough and shortness of breath.   Cardiovascular: Negative for chest pain and leg swelling.  Endocrine: Negative for cold intolerance.  Musculoskeletal: Positive for arthralgias, gait problem and joint swelling.  Allergic/Immunologic: Negative for environmental allergies.  Neurological: Negative for numbness.  Psychiatric/Behavioral: The patient is nervous/anxious.     Past Medical History:  Diagnosis Date  . Anxiety   . Asthma    mild, intermittent  . Depression   . Hypertension   . Knee injury     Past Surgical History:  Procedure Laterality Date  . DENTAL SURGERY    . PILONIDAL CYST EXCISION N/A 04/12/2015   Procedure: CYST EXCISION PILONIDAL SIMPLE;  Surgeon: Franky MachoMark Jenkins, MD;  Location: [facility];  Service: General;  Laterality: N/A;  . TONSILECTOMY, ADENOIDECTOMY, BILATERAL MYRINGOTOMY AND TUBES      Family History  Problem Relation Age of Onset  . Hypertension Mother   . Hyperlipidemia Mother   . Depression Father     committed suicide    Social History Social History  Substance Use Topics  . Smoking status: Current Some Day Smoker    Packs/day: 0.30    Years: 4.00    Types: Cigarettes    Last attempt to quit: 10/22/2005  . Smokeless tobacco: Not on file  . Alcohol use Yes     Comment: 2 drinks a day    No Known Allergies  Current Outpatient Prescriptions  Medication Sig Dispense Refill  .  acetaminophen (TYLENOL) 325 MG tablet Take 650 mg by mouth every 6 (six) hours as needed.    Marland Kitchen. albuterol (VENTOLIN HFA) 108 (90 BASE) MCG/ACT inhaler Inhale 2 puffs into the lungs every 6 (six) hours as needed.      . ALPRAZolam (XANAX) 1 MG tablet Take 1 tablet (1 mg total) by mouth 3 (three) times daily as needed. for anxiety 30 tablet 2  . amLODipine (NORVASC) 5 MG tablet TAKE 1 TABLET BY MOUTH EVERY DAY 30 tablet 0  . CIALIS 20 MG tablet TAKE 1 TABLET BY MOUTH EVERY DAY AS NEEDED FOR ERECTILE DYSFUNCTION 6 tablet 8  . cyclobenzaprine (FLEXERIL) 10 MG tablet Take 1 tablet (10 mg total) by mouth 3 (three) times daily as needed for muscle spasms. 90 tablet 0  . diclofenac (VOLTAREN) 75 MG EC tablet Take 1 tablet (75 mg total) by mouth 2 (two) times daily with a meal. 60 tablet 2  . gabapentin (NEURONTIN) 300 MG capsule One tab PO qHS for a week, then BID for a week, then TID. May double weekly to a max of 3,600mg /day 180 capsule 3  . Glucosamine-Chondroitin ([employer] GLUCOSAMINE-CHONDROITIN) 750-600 MG CHEW Chew 2 tablets by mouth 2 (two) times daily.    Marland Kitchen. HYDROcodone-acetaminophen (NORCO) 7.5-325 MG tablet Take 1 tablet by mouth every 4 (four) hours as needed for  moderate pain (Must last 30 days.  Do not drive or operate machinery while taking this medicine.). 110 tablet 0  . olmesartan-hydrochlorothiazide (BENICAR HCT) 40-25 MG tablet TAKE 1 TABLET BY MOUTH DAILY 30 tablet 0  . promethazine (PHENERGAN) 25 MG tablet TAKE 1 TABLET BY MOUTH EVERY 6 HOURS AS NEEDED FOR NAUSEA 30 tablet 0  . zaleplon (SONATA) 10 MG capsule Take 1 capsule (10 mg total) by mouth at bedtime as needed for sleep. 30 capsule 3   No current facility-administered medications for this visit.      Physical Exam  Blood pressure (!) 144/99, pulse 99, temperature 97.7 F (36.5 C), height 5\' 10"  (1.778 m), weight 250 lb (113.4 kg).  Constitutional: overall normal hygiene, normal nutrition, well developed, normal grooming, normal  body habitus. Assistive device:none  Musculoskeletal: gait and station Limp left, muscle tone and strength are normal, no tremors or atrophy is present.  .  Neurological: coordination overall normal.  Deep tendon reflex/nerve stretch intact.  Sensation normal.  Cranial nerves II-XII intact.   Skin:   Normal overall no scars, lesions, ulcers or rashes. No psoriasis.  Psychiatric: Alert and oriented x 3.  Recent memory intact, remote memory unclear.  Normal mood and affect. Well groomed.  Good eye contact.  Cardiovascular: overall no swelling, no varicosities, no edema bilaterally, normal temperatures of the legs and arms, no clubbing, cyanosis and good capillary refill.  Lymphatic: palpation is normal.  The left lower extremity is examined:  Inspection:  Thigh:  Non-tender and no defects  Knee has swelling 1+ effusion.                        Joint tenderness is present                        Patient is tender over the medial joint line  Lower Leg:  Has normal appearance and no tenderness or defects  Ankle:  Non-tender and no defects  Foot:  Non-tender and no defects Range of Motion:  Knee:  Range of motion is: 0-110                        Crepitus is  present  Ankle:  Range of motion is normal. Strength and Tone:  The left lower extremity has normal strength and tone. Stability:  Knee:  The knee is stable.  Ankle:  The ankle is stable.    The patient has been educated about the nature of the problem(s) and counseled on treatment options.  The patient appeared to understand what I have discussed and is in agreement with it.  Encounter Diagnosis  Name Primary?  . Chronic pain of left knee Yes    PLAN Call if any problems.  Precautions discussed.  Continue current medications.   Return to clinic 3 months To consider viscosupplementation.  Electronically Signed Darreld McleanWayne Antino Mayabb, MD 12/13/20172:11 PM

## 2016-08-18 ENCOUNTER — Other Ambulatory Visit: Payer: Self-pay | Admitting: Sports Medicine

## 2016-08-18 DIAGNOSIS — F411 Generalized anxiety disorder: Secondary | ICD-10-CM

## 2016-08-19 ENCOUNTER — Other Ambulatory Visit: Payer: Self-pay

## 2016-08-19 DIAGNOSIS — F411 Generalized anxiety disorder: Secondary | ICD-10-CM

## 2016-08-19 MED ORDER — ALPRAZOLAM 1 MG PO TABS: 1.0000 mg | ORAL_TABLET | Freq: Three times a day (TID) | ORAL | 2 refills | Status: DC | PRN

## 2016-08-21 ENCOUNTER — Other Ambulatory Visit: Payer: Self-pay | Admitting: Sports Medicine

## 2016-09-02 ENCOUNTER — Telehealth: Payer: Self-pay | Admitting: Orthopaedic Surgery

## 2016-09-02 ENCOUNTER — Other Ambulatory Visit: Payer: Self-pay | Admitting: *Deleted

## 2016-09-02 MED ORDER — HYDROCODONE-ACETAMINOPHEN 7.5-325 MG PO TABS: 1.0000 | ORAL_TABLET | ORAL | 0 refills | Status: DC | PRN

## 2016-09-02 NOTE — Telephone Encounter (Signed)
Dr. Sanjuan DameKeeling's pt.  Hydrocodone-Acetaminophen  7.5/325mg   Qty 110 Tablets  Take 1 tablet by mouth every 4 (hours) as needed for moderate pain(Must last 30 days. Do not drive or operate machinery while taking this medicine.

## 2016-09-20 ENCOUNTER — Other Ambulatory Visit: Payer: Self-pay | Admitting: Sports Medicine

## 2016-10-02 ENCOUNTER — Telehealth: Payer: Self-pay | Admitting: Orthopaedic Surgery

## 2016-10-02 NOTE — Telephone Encounter (Signed)
Patient called this morning and requested refill Hydrocodone/Acetaminophen 7.5-325  Qty  110       Sig: Take 1 tablet by mouth every 4 (four) hours as needed for moderate pain (Must last 30 days. Do not drive or operate machinery while taking this medicine.).

## 2016-10-05 MED ORDER — HYDROCODONE-ACETAMINOPHEN 7.5-325 MG PO TABS: 1.0000 | ORAL_TABLET | ORAL | 0 refills | Status: DC | PRN

## 2016-11-03 ENCOUNTER — Encounter: Payer: Self-pay | Admitting: Orthopaedic Surgery

## 2016-11-03 ENCOUNTER — Ambulatory Visit (INDEPENDENT_AMBULATORY_CARE_PROVIDER_SITE_OTHER): Payer: 59 | Admitting: Orthopaedic Surgery

## 2016-11-03 VITALS — BP 156/95 | HR 91 | Temp 98.2°F | Ht 70.0 in | Wt 245.0 lb

## 2016-11-03 DIAGNOSIS — M25562 Pain in left knee: Secondary | ICD-10-CM

## 2016-11-03 DIAGNOSIS — G8929 Other chronic pain: Secondary | ICD-10-CM

## 2016-11-03 DIAGNOSIS — Z720 Tobacco use: Secondary | ICD-10-CM | POA: Diagnosis not present

## 2016-11-03 MED ORDER — HYDROCODONE-ACETAMINOPHEN 7.5-325 MG PO TABS: 1.0000 | ORAL_TABLET | ORAL | 0 refills | Status: DC | PRN

## 2016-11-03 NOTE — Progress Notes (Signed)
Patient ZO:XWRUEAVW Philip Cuevas, male DOB:1981-02-04, 36 y.o. UJW:[id number]  Chief Complaint  Patient presents with  . Follow-up    Chronic left knee pain    HPI  Philip Cuevas is a 36 y.o. male who has chronic pain of the left knee.  MRI done last April did not show a meniscus problem.  He has tendency feeling the knee may give away but it does not.  He bowls every Tuesday and enjoys it.  He has no new trauma.  He has swelling and popping.  He continues to smoke but is down now to just four to six a day.  I encouraged him to stop. HPI  Body mass index is 35.15 kg/m.  ROS  Review of Systems  Constitutional:       He smokes.  He has hypertension.  HENT: Negative for congestion.   Respiratory: Negative for cough and shortness of breath.   Cardiovascular: Negative for chest pain and leg swelling.  Endocrine: Negative for cold intolerance.  Musculoskeletal: Positive for arthralgias, gait problem and joint swelling.  Allergic/Immunologic: Negative for environmental allergies.  Neurological: Negative for numbness.  Psychiatric/Behavioral: The patient is nervous/anxious.     Past Medical History:  Diagnosis Date  . Anxiety   . Asthma    mild, intermittent  . Depression   . Hypertension   . Knee injury     Past Surgical History:  Procedure Laterality Date  . DENTAL SURGERY    . PILONIDAL CYST EXCISION N/A 04/12/2015   Procedure: CYST EXCISION PILONIDAL SIMPLE;  Surgeon: Franky Macho, MD;  Location: [facility];  Service: General;  Laterality: N/A;  . TONSILECTOMY, ADENOIDECTOMY, BILATERAL MYRINGOTOMY AND TUBES      Family History  Problem Relation Age of Onset  . Hypertension Mother   . Hyperlipidemia Mother   . Depression Father     committed suicide    Social History Social History  Substance Use Topics  . Smoking status: Current Some Day Smoker    Packs/day: 0.30    Years: 4.00    Types: Cigarettes    Last attempt to quit: 10/22/2005  . Smokeless tobacco:  Never Used  . Alcohol use Yes     Comment: 2 drinks a day    No Known Allergies  Current Outpatient Prescriptions  Medication Sig Dispense Refill  . acetaminophen (TYLENOL) 325 MG tablet Take 650 mg by mouth every 6 (six) hours as needed.    Marland Kitchen albuterol (VENTOLIN HFA) 108 (90 BASE) MCG/ACT inhaler Inhale 2 puffs into the lungs every 6 (six) hours as needed.      . ALPRAZolam (XANAX) 1 MG tablet Take 1 tablet (1 mg total) by mouth 3 (three) times daily as needed. for anxiety 30 tablet 2  . amLODipine (NORVASC) 5 MG tablet TAKE 1 TABLET BY MOUTH EVERY DAY 30 tablet 0  . CIALIS 20 MG tablet TAKE 1 TABLET BY MOUTH DAILY AS NEEDED 6 tablet 0  . cyclobenzaprine (FLEXERIL) 10 MG tablet Take 1 tablet (10 mg total) by mouth 3 (three) times daily as needed for muscle spasms. 90 tablet 0  . diclofenac (VOLTAREN) 75 MG EC tablet Take 1 tablet (75 mg total) by mouth 2 (two) times daily with a meal. 60 tablet 2  . gabapentin (NEURONTIN) 300 MG capsule One tab PO qHS for a week, then BID for a week, then TID. May double weekly to a max of 3,600mg /day 180 capsule 3  . Glucosamine-Chondroitin ([employer] GLUCOSAMINE-CHONDROITIN) 750-600 MG CHEW Chew 2  tablets by mouth 2 (two) times daily.    Marland Kitchen. HYDROcodone-acetaminophen (NORCO) 7.5-325 MG tablet Take 1 tablet by mouth every 4 (four) hours as needed for moderate pain (Must last 30 days.  Do not drive or operate machinery while taking this medicine.). 105 tablet 0  . olmesartan-hydrochlorothiazide (BENICAR HCT) 40-25 MG tablet TAKE 1 TABLET BY MOUTH DAILY 30 tablet 0  . promethazine (PHENERGAN) 25 MG tablet TAKE 1 TABLET BY MOUTH EVERY 6 HOURS AS NEEDED FOR NAUSEA 30 tablet 0  . zaleplon (SONATA) 10 MG capsule Take 1 capsule (10 mg total) by mouth at bedtime as needed for sleep. 30 capsule 3   No current facility-administered medications for this visit.      Physical Exam  Blood pressure (!) 156/95, pulse 91, temperature 98.2 F (36.8 C), height 5\' 10"  (1.778 m),  weight 245 lb (111.1 kg).  Constitutional: overall normal hygiene, normal nutrition, well developed, normal grooming, normal body habitus. Assistive device:none  Musculoskeletal: gait and station Limp left, muscle tone and strength are normal, no tremors or atrophy is present.  .  Neurological: coordination overall normal.  Deep tendon reflex/nerve stretch intact.  Sensation normal.  Cranial nerves II-XII intact.   Skin:   Normal overall no scars, lesions, ulcers or rashes. No psoriasis.  Psychiatric: Alert and oriented x 3.  Recent memory intact, remote memory unclear.  Normal mood and affect. Well groomed.  Good eye contact.  Cardiovascular: overall no swelling, no varicosities, no edema bilaterally, normal temperatures of the legs and arms, no clubbing, cyanosis and good capillary refill.  Lymphatic: palpation is normal.  The left lower extremity is examined:  Inspection:  Thigh:  Non-tender and no defects  Knee has swelling 1+ effusion.                        Joint tenderness is present                        Patient is tender over the medial joint line  Lower Leg:  Has normal appearance and no tenderness or defects  Ankle:  Non-tender and no defects  Foot:  Non-tender and no defects Range of Motion:  Knee:  Range of motion is: 0-110                        Crepitus is  present  Ankle:  Range of motion is normal. Strength and Tone:  The left lower extremity has normal strength and tone. Stability:  Knee:  The knee is stable.  Ankle:  The ankle is stable.    The patient has been educated about the nature of the problem(s) and counseled on treatment options.  The patient appeared to understand what I have discussed and is in agreement with it.  Encounter Diagnoses  Name Primary?  . Chronic pain of left knee Yes  . Nicotine abuse     PLAN Call if any problems.  Precautions discussed.  Continue current medications.   Return to clinic 3 months   Electronically  Signed Darreld McleanWayne Terryn Rosenkranz, MD 3/13/20182:48 PM

## 2016-11-03 NOTE — Patient Instructions (Signed)
Steps to Quit Smoking Smoking tobacco can be bad for your health. It can also affect almost every organ in your body. Smoking puts you and people around you at risk for many serious Alexys Gassett-lasting (chronic) diseases. Quitting smoking is hard, but it is one of the best things that you can do for your health. It is never too late to quit. What are the benefits of quitting smoking? When you quit smoking, you lower your risk for getting serious diseases and conditions. They can include:  Lung cancer or lung disease.  Heart disease.  Stroke.  Heart attack.  Not being able to have children (infertility).  Weak bones (osteoporosis) and broken bones (fractures). If you have coughing, wheezing, and shortness of breath, those symptoms may get better when you quit. You may also get sick less often. If you are pregnant, quitting smoking can help to lower your chances of having a baby of low birth weight. What can I do to help me quit smoking? Talk with your doctor about what can help you quit smoking. Some things you can do (strategies) include:  Quitting smoking totally, instead of slowly cutting back how much you smoke over a period of time.  Going to in-person counseling. You are more likely to quit if you go to many counseling sessions.  Using resources and support systems, such as:  Online chats with a counselor.  Phone quitlines.  Printed self-help materials.  Support groups or group counseling.  Text messaging programs.  Mobile phone apps or applications.  Taking medicines. Some of these medicines may have nicotine in them. If you are pregnant or breastfeeding, do not take any medicines to quit smoking unless your doctor says it is okay. Talk with your doctor about counseling or other things that can help you. Talk with your doctor about using more than one strategy at the same time, such as taking medicines while you are also going to in-person counseling. This can help make quitting  easier. What things can I do to make it easier to quit? Quitting smoking might feel very hard at first, but there is a lot that you can do to make it easier. Take these steps:  Talk to your family and friends. Ask them to support and encourage you.  Call phone quitlines, reach out to support groups, or work with a counselor.  Ask people who smoke to not smoke around you.  Avoid places that make you want (trigger) to smoke, such as:  Bars.  Parties.  Smoke-break areas at work.  Spend time with people who do not smoke.  Lower the stress in your life. Stress can make you want to smoke. Try these things to help your stress:  Getting regular exercise.  Deep-breathing exercises.  Yoga.  Meditating.  Doing a body scan. To do this, close your eyes, focus on one area of your body at a time from head to toe, and notice which parts of your body are tense. Try to relax the muscles in those areas.  Download or buy apps on your mobile phone or tablet that can help you stick to your quit plan. There are many free apps, such as QuitGuide from the CDC (Centers for Disease Control and Prevention). You can find more support from smokefree.gov and other websites. This information is not intended to replace advice given to you by your health care provider. Make sure you discuss any questions you have with your health care provider. Document Released: 06/06/2009 Document Revised: 04/07/2016 Document   Reviewed: 12/25/2014 Elsevier Interactive Patient Education  2017 [employer] Inc.  

## 2016-11-04 ENCOUNTER — Other Ambulatory Visit: Payer: Self-pay | Admitting: Sports Medicine

## 2016-11-04 DIAGNOSIS — F411 Generalized anxiety disorder: Secondary | ICD-10-CM

## 2016-11-30 ENCOUNTER — Encounter: Payer: Self-pay | Admitting: Orthopaedic Surgery

## 2016-11-30 ENCOUNTER — Telehealth: Payer: Self-pay | Admitting: Orthopaedic Surgery

## 2016-11-30 ENCOUNTER — Other Ambulatory Visit: Payer: Self-pay | Admitting: Sports Medicine

## 2016-11-30 DIAGNOSIS — F411 Generalized anxiety disorder: Secondary | ICD-10-CM

## 2016-11-30 MED ORDER — ALPRAZOLAM 1 MG PO TABS: 1.0000 mg | ORAL_TABLET | Freq: Three times a day (TID) | ORAL | 0 refills | Status: DC | PRN

## 2016-12-01 MED ORDER — HYDROCODONE-ACETAMINOPHEN 7.5-325 MG PO TABS: 1.0000 | ORAL_TABLET | Freq: Four times a day (QID) | ORAL | 0 refills | Status: DC | PRN

## 2016-12-01 NOTE — Telephone Encounter (Signed)
Pt advised.

## 2016-12-07 ENCOUNTER — Other Ambulatory Visit: Payer: Self-pay | Admitting: Sports Medicine

## 2016-12-11 ENCOUNTER — Other Ambulatory Visit: Payer: Self-pay | Admitting: Sports Medicine

## 2016-12-11 DIAGNOSIS — G4700 Insomnia, unspecified: Secondary | ICD-10-CM

## 2016-12-11 MED ORDER — PROMETHAZINE HCL 25 MG PO TABS: 25.0000 mg | ORAL_TABLET | Freq: Four times a day (QID) | ORAL | 0 refills | Status: DC | PRN

## 2016-12-28 ENCOUNTER — Telehealth: Payer: Self-pay | Admitting: Orthopaedic Surgery

## 2016-12-28 ENCOUNTER — Other Ambulatory Visit: Payer: Self-pay | Admitting: Sports Medicine

## 2016-12-28 DIAGNOSIS — F411 Generalized anxiety disorder: Secondary | ICD-10-CM

## 2016-12-29 MED ORDER — HYDROCODONE-ACETAMINOPHEN 7.5-325 MG PO TABS: 1.0000 | ORAL_TABLET | Freq: Four times a day (QID) | ORAL | 0 refills | Status: DC | PRN

## 2016-12-30 ENCOUNTER — Other Ambulatory Visit: Payer: Self-pay | Admitting: Sports Medicine

## 2016-12-30 DIAGNOSIS — F411 Generalized anxiety disorder: Secondary | ICD-10-CM

## 2016-12-31 MED ORDER — ALPRAZOLAM 1 MG PO TABS: 1.0000 mg | ORAL_TABLET | Freq: Three times a day (TID) | ORAL | 0 refills | Status: DC | PRN

## 2016-12-31 NOTE — Telephone Encounter (Signed)
Pt is requesting a refill of alprazolam. He's been on this medication daily since 2013. Is this still appropriate to refill? Please advise.

## 2017-01-11 ENCOUNTER — Other Ambulatory Visit: Payer: Self-pay | Admitting: Sports Medicine

## 2017-01-27 ENCOUNTER — Other Ambulatory Visit: Payer: Self-pay | Admitting: Sports Medicine

## 2017-01-27 ENCOUNTER — Telehealth: Payer: Self-pay | Admitting: Orthopaedic Surgery

## 2017-01-27 DIAGNOSIS — F411 Generalized anxiety disorder: Secondary | ICD-10-CM

## 2017-01-28 ENCOUNTER — Other Ambulatory Visit: Payer: Self-pay

## 2017-01-28 DIAGNOSIS — F411 Generalized anxiety disorder: Secondary | ICD-10-CM

## 2017-01-28 MED ORDER — HYDROCODONE-ACETAMINOPHEN 7.5-325 MG PO TABS: 1.0000 | ORAL_TABLET | Freq: Four times a day (QID) | ORAL | 0 refills | Status: DC | PRN

## 2017-01-28 MED ORDER — ALPRAZOLAM 1 MG PO TABS: 1.0000 mg | ORAL_TABLET | Freq: Three times a day (TID) | ORAL | 0 refills | Status: DC | PRN

## 2017-02-03 ENCOUNTER — Ambulatory Visit: Payer: 59 | Admitting: Orthopaedic Surgery

## 2017-02-10 ENCOUNTER — Ambulatory Visit: Payer: 59 | Admitting: Orthopaedic Surgery

## 2017-02-16 ENCOUNTER — Ambulatory Visit: Payer: 59 | Admitting: Orthopaedic Surgery

## 2017-02-18 ENCOUNTER — Encounter: Payer: Self-pay | Admitting: Orthopaedic Surgery

## 2017-02-18 ENCOUNTER — Ambulatory Visit (INDEPENDENT_AMBULATORY_CARE_PROVIDER_SITE_OTHER): Payer: 59 | Admitting: Orthopaedic Surgery

## 2017-02-18 VITALS — BP 138/80 | HR 74 | Temp 97.1°F | Ht 70.0 in | Wt 252.0 lb

## 2017-02-18 DIAGNOSIS — Z720 Tobacco use: Secondary | ICD-10-CM

## 2017-02-18 DIAGNOSIS — M25562 Pain in left knee: Secondary | ICD-10-CM | POA: Diagnosis not present

## 2017-02-18 DIAGNOSIS — G8929 Other chronic pain: Secondary | ICD-10-CM | POA: Diagnosis not present

## 2017-02-18 MED ORDER — HYDROCODONE-ACETAMINOPHEN 7.5-325 MG PO TABS: 1.0000 | ORAL_TABLET | Freq: Four times a day (QID) | ORAL | 0 refills | Status: DC | PRN

## 2017-02-18 NOTE — Progress Notes (Signed)
Patient ZO:XWRUEAVW:Philip Cuevas, male DOB:06/13/1981, 36 y.o. UJW:[id number]  Chief Complaint  Patient presents with  . Follow-up    LEFT KNEE PAIN    HPI  Philip Cuevas is a 36 y.o. male who has chronic left knee pain.  He has continued swelling and popping and some giving way.  He has not improved with conservative treatment.  He has no new trauma.  I will get a  MRI of the knee. HPI  Body mass index is 36.16 kg/m.  ROS  Review of Systems  Constitutional:       He smokes.  He has hypertension.  HENT: Negative for congestion.   Respiratory: Negative for cough and shortness of breath.   Cardiovascular: Negative for chest pain and leg swelling.  Endocrine: Negative for cold intolerance.  Musculoskeletal: Positive for arthralgias, gait problem and joint swelling.  Allergic/Immunologic: Negative for environmental allergies.  Neurological: Negative for numbness.  Psychiatric/Behavioral: The patient is nervous/anxious.     Past Medical History:  Diagnosis Date  . Anxiety   . Asthma    mild, intermittent  . Depression   . Hypertension   . Knee injury     Past Surgical History:  Procedure Laterality Date  . DENTAL SURGERY    . PILONIDAL CYST EXCISION N/A 04/12/2015   Procedure: CYST EXCISION PILONIDAL SIMPLE;  Surgeon: Franky MachoMark Jenkins, MD;  Location: [facility];  Service: General;  Laterality: N/A;  . TONSILECTOMY, ADENOIDECTOMY, BILATERAL MYRINGOTOMY AND TUBES      Family History  Problem Relation Age of Onset  . Hypertension Mother   . Hyperlipidemia Mother   . Depression Father        committed suicide    Social History Social History  Substance Use Topics  . Smoking status: Current Some Day Smoker    Packs/day: 0.30    Years: 4.00    Types: Cigarettes    Last attempt to quit: 10/22/2005  . Smokeless tobacco: Never Used  . Alcohol use Yes     Comment: 2 drinks a day    No Known Allergies  Current Outpatient Prescriptions  Medication Sig Dispense Refill  .  acetaminophen (TYLENOL) 325 MG tablet Take 650 mg by mouth every 6 (six) hours as needed.    Marland Kitchen. albuterol (VENTOLIN HFA) 108 (90 BASE) MCG/ACT inhaler Inhale 2 puffs into the lungs every 6 (six) hours as needed.      . ALPRAZolam (XANAX) 1 MG tablet Take 1 tablet (1 mg total) by mouth 3 (three) times daily as needed. for anxiety 30 tablet 0  . amLODipine (NORVASC) 5 MG tablet TAKE 1 TABLET BY MOUTH EVERY DAY 30 tablet 0  . CIALIS 20 MG tablet TAKE 1 TABLET BY MOUTH DAILY AS NEEDED 6 tablet 0  . cyclobenzaprine (FLEXERIL) 10 MG tablet Take 1 tablet (10 mg total) by mouth 3 (three) times daily as needed for muscle spasms. 90 tablet 0  . diclofenac (VOLTAREN) 75 MG EC tablet Take 1 tablet (75 mg total) by mouth 2 (two) times daily with a meal. 60 tablet 2  . gabapentin (NEURONTIN) 300 MG capsule One tab PO qHS for a week, then BID for a week, then TID. May double weekly to a max of 3,600mg /day 180 capsule 3  . Glucosamine-Chondroitin ([employer] GLUCOSAMINE-CHONDROITIN) 750-600 MG CHEW Chew 2 tablets by mouth 2 (two) times daily.    Marland Kitchen. HYDROcodone-acetaminophen (NORCO) 7.5-325 MG tablet Take 1 tablet by mouth every 6 (six) hours as needed for moderate pain (Must last  30 days.Do not drive or operate machinery while taking this medicine.). 100 tablet 0  . olmesartan-hydrochlorothiazide (BENICAR HCT) 40-25 MG tablet TAKE 1 TABLET BY MOUTH DAILY 30 tablet 0  . olmesartan-hydrochlorothiazide (BENICAR HCT) 40-25 MG tablet Take 1 tablet by mouth daily. NEEDS APPOINTMENT FOR FUTURE REFILLS. 30 tablet 0  . promethazine (PHENERGAN) 25 MG tablet TAKE 1 TABLET(25 MG) BY MOUTH EVERY 6 HOURS AS NEEDED FOR NAUSEA 30 tablet 0  . zaleplon (SONATA) 10 MG capsule TAKE 1 CAPSULE BY MOUTH EVERY NIGHT AT BEDTIME AS NEEDED FOR SLEEP 30 capsule 0   No current facility-administered medications for this visit.      Physical Exam  Blood pressure 138/80, pulse 74, temperature 97.1 F (36.2 C), height 5\' 10"  (1.778 m), weight 252 lb  (114.3 kg).  Constitutional: overall normal hygiene, normal nutrition, well developed, normal grooming, normal body habitus. Assistive device:none  Musculoskeletal: gait and station Limp left, muscle tone and strength are normal, no tremors or atrophy is present.  .  Neurological: coordination overall normal.  Deep tendon reflex/nerve stretch intact.  Sensation normal.  Cranial nerves II-XII intact.   Skin:   Normal overall no scars, lesions, ulcers or rashes. No psoriasis.  Psychiatric: Alert and oriented x 3.  Recent memory intact, remote memory unclear.  Normal mood and affect. Well groomed.  Good eye contact.  Cardiovascular: overall no swelling, no varicosities, no edema bilaterally, normal temperatures of the legs and arms, no clubbing, cyanosis and good capillary refill.  Lymphatic: palpation is normal.  The left lower extremity is examined:  Inspection:  Thigh:  Non-tender and no defects  Knee has swelling 1+ effusion.                        Joint tenderness is present                        Patient is tender over the medial joint line  Lower Leg:  Has normal appearance and no tenderness or defects  Ankle:  Non-tender and no defects  Foot:  Non-tender and no defects Range of Motion:  Knee:  Range of motion is: 0-110                        Crepitus is  present  Ankle:  Range of motion is normal. Strength and Tone:  The left lower extremity has normal strength and tone. Stability:  Knee:  The knee has positive medial McMurray  Ankle:  The ankle is stable.     The patient has been educated about the nature of the problem(s) and counseled on treatment options.  The patient appeared to understand what I have discussed and is in agreement with it.  Encounter Diagnoses  Name Primary?  . Chronic pain of left knee Yes  . Nicotine abuse    He continues to smoke but has cut way back. PLAN Call if any problems.  Precautions discussed.  Continue current medications.    Return to clinic after MRI of the left knee.   I have reviewed the [place] Controlled Substance Reporting System web site prior to prescribing narcotic medicine for this patient.  Electronically Signed Darreld Mclean, MD 6/28/20183:19 PM

## 2017-02-18 NOTE — Patient Instructions (Signed)
Steps to Quit Smoking Smoking tobacco can be bad for your health. It can also affect almost every organ in your body. Smoking puts you and people around you at risk for many serious long-lasting (chronic) diseases. Quitting smoking is hard, but it is one of the best things that you can do for your health. It is never too late to quit. What are the benefits of quitting smoking? When you quit smoking, you lower your risk for getting serious diseases and conditions. They can include:  Lung cancer or lung disease.  Heart disease.  Stroke.  Heart attack.  Not being able to have children (infertility).  Weak bones (osteoporosis) and broken bones (fractures).  If you have coughing, wheezing, and shortness of breath, those symptoms may get better when you quit. You may also get sick less often. If you are pregnant, quitting smoking can help to lower your chances of having a baby of low birth weight. What can I do to help me quit smoking? Talk with your doctor about what can help you quit smoking. Some things you can do (strategies) include:  Quitting smoking totally, instead of slowly cutting back how much you smoke over a period of time.  Going to in-person counseling. You are more likely to quit if you go to many counseling sessions.  Using resources and support systems, such as: ? Online chats with a counselor. ? Phone quitlines. ? Printed self-help materials. ? Support groups or group counseling. ? Text messaging programs. ? Mobile phone apps or applications.  Taking medicines. Some of these medicines may have nicotine in them. If you are pregnant or breastfeeding, do not take any medicines to quit smoking unless your doctor says it is okay. Talk with your doctor about counseling or other things that can help you.  Talk with your doctor about using more than one strategy at the same time, such as taking medicines while you are also going to in-person counseling. This can help make  quitting easier. What things can I do to make it easier to quit? Quitting smoking might feel very hard at first, but there is a lot that you can do to make it easier. Take these steps:  Talk to your family and friends. Ask them to support and encourage you.  Call phone quitlines, reach out to support groups, or work with a counselor.  Ask people who smoke to not smoke around you.  Avoid places that make you want (trigger) to smoke, such as: ? Bars. ? Parties. ? Smoke-break areas at work.  Spend time with people who do not smoke.  Lower the stress in your life. Stress can make you want to smoke. Try these things to help your stress: ? Getting regular exercise. ? Deep-breathing exercises. ? Yoga. ? Meditating. ? Doing a body scan. To do this, close your eyes, focus on one area of your body at a time from head to toe, and notice which parts of your body are tense. Try to relax the muscles in those areas.  Download or buy apps on your mobile phone or tablet that can help you stick to your quit plan. There are many free apps, such as QuitGuide from the CDC (Centers for Disease Control and Prevention). You can find more support from smokefree.gov and other websites.  This information is not intended to replace advice given to you by your health care provider. Make sure you discuss any questions you have with your health care provider. Document Released: 06/06/2009 Document   Revised: 04/07/2016 Document Reviewed: 12/25/2014 Elsevier Interactive Patient Education  2018 Elsevier Inc.  

## 2017-02-22 ENCOUNTER — Other Ambulatory Visit: Payer: Self-pay | Admitting: Sports Medicine

## 2017-02-22 DIAGNOSIS — G4700 Insomnia, unspecified: Secondary | ICD-10-CM

## 2017-02-22 DIAGNOSIS — F411 Generalized anxiety disorder: Secondary | ICD-10-CM

## 2017-02-26 ENCOUNTER — Other Ambulatory Visit: Payer: Self-pay | Admitting: Sports Medicine

## 2017-02-26 DIAGNOSIS — F411 Generalized anxiety disorder: Secondary | ICD-10-CM

## 2017-02-26 DIAGNOSIS — G4700 Insomnia, unspecified: Secondary | ICD-10-CM

## 2017-03-02 ENCOUNTER — Ambulatory Visit (INDEPENDENT_AMBULATORY_CARE_PROVIDER_SITE_OTHER): Payer: 59 | Admitting: Sports Medicine

## 2017-03-02 ENCOUNTER — Other Ambulatory Visit: Payer: Self-pay | Admitting: Sports Medicine

## 2017-03-02 ENCOUNTER — Encounter: Payer: Self-pay | Admitting: Sports Medicine

## 2017-03-02 DIAGNOSIS — E781 Pure hyperglyceridemia: Secondary | ICD-10-CM

## 2017-03-02 DIAGNOSIS — I10 Essential (primary) hypertension: Secondary | ICD-10-CM | POA: Diagnosis not present

## 2017-03-02 DIAGNOSIS — R1013 Epigastric pain: Secondary | ICD-10-CM

## 2017-03-02 DIAGNOSIS — F5101 Primary insomnia: Secondary | ICD-10-CM

## 2017-03-02 DIAGNOSIS — G8929 Other chronic pain: Secondary | ICD-10-CM | POA: Diagnosis not present

## 2017-03-02 LAB — CBC
HCT: 44.7 % (ref 38.5–50.0)
Hemoglobin: 15.6 g/dL (ref 13.2–17.1)
MCH: 32.8 pg (ref 27.0–33.0)
MCHC: 34.9 g/dL (ref 32.0–36.0)
MCV: 94.1 fL (ref 80.0–100.0)
MPV: 8.9 fL (ref 7.5–12.5)
Platelets: 177 K/uL (ref 140–400)
RBC: 4.75 MIL/uL (ref 4.20–5.80)
RDW: 13.8 % (ref 11.0–15.0)
WBC: 5 K/uL (ref 3.8–10.8)

## 2017-03-02 MED ORDER — AMLODIPINE BESYLATE 5 MG PO TABS: 5.0000 mg | ORAL_TABLET | Freq: Every day | ORAL | 0 refills | Status: DC

## 2017-03-02 MED ORDER — ALPRAZOLAM 1 MG PO TABS: 1.0000 mg | ORAL_TABLET | Freq: Three times a day (TID) | ORAL | 0 refills | Status: DC | PRN

## 2017-03-02 MED ORDER — PROMETHAZINE HCL 25 MG PO TABS: 25.0000 mg | ORAL_TABLET | Freq: Three times a day (TID) | ORAL | 0 refills | Status: DC | PRN

## 2017-03-02 MED ORDER — PANTOPRAZOLE SODIUM 40 MG PO TBEC: 40.0000 mg | DELAYED_RELEASE_TABLET | Freq: Every day | ORAL | 3 refills | Status: DC

## 2017-03-02 MED ORDER — ZALEPLON 10 MG PO CAPS: 20.0000 mg | ORAL_CAPSULE | Freq: Every evening | ORAL | 0 refills | Status: DC | PRN

## 2017-03-02 MED ORDER — OLMESARTAN MEDOXOMIL-HCTZ 40-25 MG PO TABS: 1.0000 | ORAL_TABLET | Freq: Every day | ORAL | 0 refills | Status: DC

## 2017-03-02 MED ORDER — ZALEPLON 10 MG PO CAPS: 10.0000 mg | ORAL_CAPSULE | Freq: Every evening | ORAL | 0 refills | Status: DC | PRN

## 2017-03-02 MED ORDER — AMLODIPINE BESYLATE 5 MG PO TABS: 5.0000 mg | ORAL_TABLET | Freq: Every day | ORAL | 3 refills | Status: DC

## 2017-03-02 MED ORDER — OLMESARTAN MEDOXOMIL-HCTZ 40-25 MG PO TABS: 1.0000 | ORAL_TABLET | Freq: Every day | ORAL | 3 refills | Status: DC

## 2017-03-02 NOTE — Assessment & Plan Note (Signed)
Controlled, refilling medication and checking routine blood work.

## 2017-03-02 NOTE — Progress Notes (Signed)
  Subjective:    CC: Follow-up  HPI: Anxiety: Needs a refill on Xanax.  Insomnia: Needs a refill on Sonata.  Epigastric pain: History of peptic ulcer disease, for the past several weeks has had increasing epigastric pain, occasionally in the right upper quadrant with radiation to the right scapula, intermittent, unable to determine if it's worse with any type of food. Denies any melena, hematochezia, hematemesis. No weight loss. No constitutional symptoms.  Hypertension: Well controlled.  Past medical history:  Negative.  See flowsheet/record as well for more information.  Surgical history: Negative.  See flowsheet/record as well for more information.  Family history: Negative.  See flowsheet/record as well for more information.  Social history: Negative.  See flowsheet/record as well for more information.  Allergies, and medications have been entered into the medical record, reviewed, and no changes needed.   Review of Systems: No fevers, chills, night sweats, weight loss, chest pain, or shortness of breath.   Objective:    General: Well Developed, well nourished, and in no acute distress.  Neuro: Alert and oriented x3, extra-ocular muscles intact, sensation grossly intact.  HEENT: Normocephalic, atraumatic, pupils equal round reactive to light, neck supple, no masses, no lymphadenopathy, thyroid nonpalpable.  Skin: Warm and dry, no rashes. Cardiac: Regular rate and rhythm, no murmurs rubs or gallops, no lower extremity edema.  Respiratory: Clear to auscultation bilaterally. Not using accessory muscles, speaking in full sentences. Abdomen: Soft, tender to palpation in the epigastrium and the right upper quadrant, no costovertebral in pain, no guarding, rigidity, rebound pain, no bowel sounds.  Impression and Recommendations:    Benign essential hypertension Controlled, refilling medication and checking routine blood work.  Chronic epigastric pain Dyspepsia versus biliary  colic. Amylase, lipase, urease breath test, abdominal ultrasound. Starting protonix. Does have a history of peptic ulcer disease. Holding off on NSAIDs for now.  I spent 40 minutes with this patient, greater than 50% was face-to-face time counseling regarding the above diagnoses

## 2017-03-02 NOTE — Assessment & Plan Note (Addendum)
Dyspepsia versus biliary colic. Amylase, lipase, urease breath test, abdominal ultrasound. Starting protonix. Does have a history of peptic ulcer disease. Holding off on NSAIDs for now.  Gallbladder and bile ducts look normal, there is a diffuse increase in liver echogenicity consistent with fatty liver disease/hepatic steatohepatitis. This is the likely cause of the right upper quadrant pain, we are still awaiting some of the lab testing including H. pylori. Aggressive weight loss will likely make this better.

## 2017-03-03 ENCOUNTER — Encounter: Payer: Self-pay | Admitting: Sports Medicine

## 2017-03-03 ENCOUNTER — Telehealth: Payer: Self-pay

## 2017-03-03 DIAGNOSIS — E781 Pure hyperglyceridemia: Secondary | ICD-10-CM | POA: Insufficient documentation

## 2017-03-03 LAB — LIPID PANEL W/REFLEX DIRECT LDL
Cholesterol: 208 mg/dL — ABNORMAL HIGH (ref <200)
HDL: 25 mg/dL — ABNORMAL LOW (ref >40)
Non-HDL Cholesterol (Calc): 183 mg/dL — ABNORMAL HIGH (ref <130)
Total CHOL/HDL Ratio: 8.3 ratio — ABNORMAL HIGH (ref <5.0)
Triglycerides: 689 mg/dL — ABNORMAL HIGH (ref <150)

## 2017-03-03 LAB — HEMOGLOBIN A1C
Hgb A1c MFr Bld: 5 % (ref <5.7)
Mean Plasma Glucose: 97 mg/dL

## 2017-03-03 LAB — COMPREHENSIVE METABOLIC PANEL WITH GFR
ALT: 41 U/L (ref 9–46)
Alkaline Phosphatase: 58 U/L (ref 40–115)
BUN: 13 mg/dL (ref 7–25)
Potassium: 3.9 mmol/L (ref 3.5–5.3)
Total Protein: 6.8 g/dL (ref 6.1–8.1)

## 2017-03-03 LAB — VITAMIN D 25 HYDROXY (VIT D DEFICIENCY, FRACTURES): Vit D, 25-Hydroxy: 10 ng/mL — ABNORMAL LOW (ref 30–100)

## 2017-03-03 LAB — COMPREHENSIVE METABOLIC PANEL
AST: 23 U/L (ref 10–40)
Albumin: 4.7 g/dL (ref 3.6–5.1)
CO2: 24 mmol/L (ref 20–31)
Calcium: 9.4 mg/dL (ref 8.6–10.3)
Chloride: 100 mmol/L (ref 98–110)
Creat: 1.12 mg/dL (ref 0.60–1.35)
Glucose, Bld: 92 mg/dL (ref 65–99)
Sodium: 136 mmol/L (ref 135–146)
Total Bilirubin: 0.6 mg/dL (ref 0.2–1.2)

## 2017-03-03 LAB — LDL CHOLESTEROL, DIRECT: Direct LDL: 106 mg/dL — ABNORMAL HIGH (ref <100)

## 2017-03-03 LAB — TSH: TSH: 4.01 mIU/L (ref 0.40–4.50)

## 2017-03-03 LAB — LIPASE: Lipase: 40 U/L (ref 7–60)

## 2017-03-03 LAB — AMYLASE: Amylase: 27 U/L (ref 21–101)

## 2017-03-03 MED ORDER — ATORVASTATIN CALCIUM 20 MG PO TABS: 20.0000 mg | ORAL_TABLET | Freq: Every day | ORAL | 3 refills | Status: DC

## 2017-03-03 MED ORDER — VITAMIN D (ERGOCALCIFEROL) 1.25 MG (50000 UNIT) PO CAPS: 50000.0000 [IU] | ORAL_CAPSULE | ORAL | 0 refills | Status: DC

## 2017-03-03 MED ORDER — FENOFIBRATE 160 MG PO TABS: 160.0000 mg | ORAL_TABLET | Freq: Every day | ORAL | 3 refills | Status: DC

## 2017-03-03 NOTE — Addendum Note (Signed)
Addended by: Monica BectonHEKKEKANDAM, THOMAS J on: 03/03/2017 11:12 AM   Modules accepted: Orders

## 2017-03-03 NOTE — Telephone Encounter (Signed)
Patient called with concerns about triglycerides being high. I advised him of the medication that was sent in for this as well as to get labs rechecked in 8 weeks. Patient requested a call back from provider to see what would make his Triglycerides that high. Please advise. Rhonda Cunningham,CMA

## 2017-03-03 NOTE — Assessment & Plan Note (Signed)
Lipids are extremely high, particular triglycerides, adding fenofibrate and low-dose atorvastatin, recheck in 8 weeks.

## 2017-03-04 ENCOUNTER — Ambulatory Visit (INDEPENDENT_AMBULATORY_CARE_PROVIDER_SITE_OTHER): Payer: 59

## 2017-03-04 DIAGNOSIS — R109 Unspecified abdominal pain: Secondary | ICD-10-CM | POA: Diagnosis not present

## 2017-03-04 DIAGNOSIS — R1013 Epigastric pain: Principal | ICD-10-CM

## 2017-03-04 DIAGNOSIS — G8929 Other chronic pain: Secondary | ICD-10-CM | POA: Diagnosis not present

## 2017-03-04 NOTE — Telephone Encounter (Signed)
Per Dr. Karie Schwalbe he sent the patient a message through mychart. Rhonda Cunningham,CMA

## 2017-03-04 NOTE — Telephone Encounter (Signed)
Hypertriglyceridemia has multiple causes including not being completely fasting during the lab check, high fat diet, and genetic causes.  He does have morbid obesity so diet is certainly a concern.  As high as they are, he definitely will need medication (and maybe 2) to bring it down.  No change in plan just yet.

## 2017-03-04 NOTE — Telephone Encounter (Signed)
Dr. T please see note below. Jillene Wehrenberg,CMA  

## 2017-03-05 LAB — H. PYLORI BREATH TEST: H. pylori Breath Test: NOT DETECTED

## 2017-03-10 ENCOUNTER — Ambulatory Visit (INDEPENDENT_AMBULATORY_CARE_PROVIDER_SITE_OTHER): Payer: 59 | Admitting: Orthopaedic Surgery

## 2017-03-10 ENCOUNTER — Encounter: Payer: Self-pay | Admitting: Orthopaedic Surgery

## 2017-03-10 VITALS — BP 112/69 | HR 81 | Temp 97.4°F | Ht 70.0 in | Wt 251.0 lb

## 2017-03-10 DIAGNOSIS — G8929 Other chronic pain: Secondary | ICD-10-CM | POA: Diagnosis not present

## 2017-03-10 DIAGNOSIS — M25562 Pain in left knee: Secondary | ICD-10-CM

## 2017-03-10 MED ORDER — HYDROCODONE-ACETAMINOPHEN 7.5-325 MG PO TABS: 1.0000 | ORAL_TABLET | Freq: Four times a day (QID) | ORAL | 0 refills | Status: DC | PRN

## 2017-03-10 NOTE — Progress Notes (Signed)
Patient Philip Cuevas, male DOB:01/01/1981, 36 y.o. UJW:[id number]  Chief Complaint  Patient presents with  . Results    MRI LEFT KNEE    HPI  Philip RowanJonathan W Cuevas is a 36 y.o. male who has chronic pain of the left knee.  He had a MRI done which shows: Intact cruciates, collaterals and menisci.  Some partial-thickness chondrosis of patella. And signs of tendinosis of patella tendon.  He has edema signal of fat pad and Baker cyst.  I have gone over the report with him.    I will continue his present medicine.   HPI  Body mass index is 36.01 kg/m.  ROS  Review of Systems  Constitutional:       He smokes.  He has hypertension.  HENT: Negative for congestion.   Respiratory: Negative for cough and shortness of breath.   Cardiovascular: Negative for chest pain and leg swelling.  Endocrine: Negative for cold intolerance.  Musculoskeletal: Positive for arthralgias, gait problem and joint swelling.  Allergic/Immunologic: Negative for environmental allergies.  Neurological: Negative for numbness.  Psychiatric/Behavioral: The patient is nervous/anxious.     Past Medical History:  Diagnosis Date  . Anxiety   . Asthma    mild, intermittent  . Depression   . Hypertension   . Knee injury     Past Surgical History:  Procedure Laterality Date  . DENTAL SURGERY    . PILONIDAL CYST EXCISION N/A 04/12/2015   Procedure: CYST EXCISION PILONIDAL SIMPLE;  Surgeon: Franky MachoMark Jenkins, MD;  Location: [facility];  Service: General;  Laterality: N/A;  . TONSILECTOMY, ADENOIDECTOMY, BILATERAL MYRINGOTOMY AND TUBES      Family History  Problem Relation Age of Onset  . Hypertension Mother   . Hyperlipidemia Mother   . Depression Father        committed suicide    Social History Social History  Substance Use Topics  . Smoking status: Current Some Day Smoker    Packs/day: 0.30    Years: 4.00    Types: Cigarettes    Last attempt to quit: 10/22/2005  . Smokeless tobacco: Never Used  .  Alcohol use Yes     Comment: 2 drinks a day    No Known Allergies  Current Outpatient Prescriptions  Medication Sig Dispense Refill  . albuterol (VENTOLIN HFA) 108 (90 BASE) MCG/ACT inhaler Inhale 2 puffs into the lungs every 6 (six) hours as needed.      . ALPRAZolam (XANAX) 1 MG tablet Take 1 tablet (1 mg total) by mouth 3 (three) times daily as needed. for anxiety 30 tablet 0  . amLODipine (NORVASC) 5 MG tablet Take 1 tablet (5 mg total) by mouth daily. 90 tablet 3  . atorvastatin (LIPITOR) 20 MG tablet Take 1 tablet (20 mg total) by mouth daily at 6 PM. 90 tablet 3  . CIALIS 20 MG tablet TAKE 1 TABLET BY MOUTH DAILY AS NEEDED 6 tablet 0  . fenofibrate 160 MG tablet Take 1 tablet (160 mg total) by mouth daily. 90 tablet 3  . HYDROcodone-acetaminophen (NORCO) 7.5-325 MG tablet Take 1 tablet by mouth every 6 (six) hours as needed for moderate pain (Must last 30 days.Do not drive or operate machinery while taking this medicine.). 100 tablet 0  . olmesartan-hydrochlorothiazide (BENICAR HCT) 40-25 MG tablet Take 1 tablet by mouth daily. 90 tablet 3  . pantoprazole (PROTONIX) 40 MG tablet Take 1 tablet (40 mg total) by mouth daily with supper. 30 tablet 3  . promethazine (PHENERGAN) 25 MG  tablet Take 1 tablet (25 mg total) by mouth every 8 (eight) hours as needed for nausea or vomiting. 30 tablet 0  . Vitamin D, Ergocalciferol, (DRISDOL) 50000 units CAPS capsule Take 1 capsule (50,000 Units total) by mouth every 7 (seven) days. Take for 8 total doses(weeks) 8 capsule 0  . zaleplon (SONATA) 10 MG capsule Take 2 capsules (20 mg total) by mouth at bedtime as needed for sleep. 60 capsule 0   No current facility-administered medications for this visit.      Physical Exam  Blood pressure 112/69, pulse 81, temperature (!) 97.4 F (36.3 C), height 5\' 10"  (1.778 m), weight 251 lb (113.9 kg).  Constitutional: overall normal hygiene, normal nutrition, well developed, normal grooming, normal body  habitus. Assistive device:none  Musculoskeletal: gait and station Limp left, muscle tone and strength are normal, no tremors or atrophy is present.  .  Neurological: coordination overall normal.  Deep tendon reflex/nerve stretch intact.  Sensation normal.  Cranial nerves II-XII intact.   Skin:   Normal overall no scars, lesions, ulcers or rashes. No psoriasis.  Psychiatric: Alert and oriented x 3.  Recent memory intact, remote memory unclear.  Normal mood and affect. Well groomed.  Good eye contact.  Cardiovascular: overall no swelling, no varicosities, no edema bilaterally, normal temperatures of the legs and arms, no clubbing, cyanosis and good capillary refill.  Lymphatic: palpation is normal.  Left knee has ROM 0 to 115, some crepitus, slight effusion, limp to the left, no redness, normal NV status.  The patient has been educated about the nature of the problem(s) and counseled on treatment options.  The patient appeared to understand what I have discussed and is in agreement with it.  Encounter Diagnosis  Name Primary?  . Chronic pain of left knee Yes    PLAN Call if any problems.  Precautions discussed.  Continue current medications.   Return to clinic 3 months   I have reviewed the [facility] Controlled Substance Reporting System web site prior to prescribing narcotic medicine for this patient.  Electronically Signed Darreld Mclean, MD 7/18/20182:55 PM

## 2017-03-25 ENCOUNTER — Other Ambulatory Visit: Payer: Self-pay | Admitting: Sports Medicine

## 2017-03-25 DIAGNOSIS — F411 Generalized anxiety disorder: Secondary | ICD-10-CM

## 2017-03-25 MED ORDER — ALPRAZOLAM 1 MG PO TABS: 1.0000 mg | ORAL_TABLET | Freq: Three times a day (TID) | ORAL | 0 refills | Status: DC | PRN

## 2017-03-25 MED ORDER — TADALAFIL 20 MG PO TABS: 20.0000 mg | ORAL_TABLET | Freq: Every day | ORAL | 0 refills | Status: DC | PRN

## 2017-03-30 ENCOUNTER — Ambulatory Visit: Payer: 59 | Admitting: Sports Medicine

## 2017-04-08 ENCOUNTER — Ambulatory Visit: Payer: 59 | Admitting: Sports Medicine

## 2017-04-08 DIAGNOSIS — Z0189 Encounter for other specified special examinations: Secondary | ICD-10-CM

## 2017-04-19 ENCOUNTER — Telehealth: Payer: Self-pay | Admitting: Orthopaedic Surgery

## 2017-04-19 ENCOUNTER — Other Ambulatory Visit: Payer: Self-pay | Admitting: Sports Medicine

## 2017-04-19 DIAGNOSIS — F411 Generalized anxiety disorder: Secondary | ICD-10-CM

## 2017-04-20 MED ORDER — HYDROCODONE-ACETAMINOPHEN 7.5-325 MG PO TABS: 1.0000 | ORAL_TABLET | Freq: Four times a day (QID) | ORAL | 0 refills | Status: DC | PRN

## 2017-04-21 ENCOUNTER — Other Ambulatory Visit: Payer: Self-pay

## 2017-04-22 ENCOUNTER — Other Ambulatory Visit: Payer: Self-pay | Admitting: Sports Medicine

## 2017-04-22 DIAGNOSIS — F411 Generalized anxiety disorder: Secondary | ICD-10-CM

## 2017-04-23 MED ORDER — ALPRAZOLAM 1 MG PO TABS: 1.0000 mg | ORAL_TABLET | Freq: Three times a day (TID) | ORAL | 0 refills | Status: DC | PRN

## 2017-04-27 ENCOUNTER — Ambulatory Visit: Payer: 59 | Admitting: Sports Medicine

## 2017-04-28 ENCOUNTER — Telehealth: Payer: Self-pay | Admitting: Radiology

## 2017-04-28 ENCOUNTER — Encounter: Payer: Self-pay | Admitting: Orthopaedic Surgery

## 2017-04-28 NOTE — Telephone Encounter (Signed)
RX written for Hydrocodone/ACET on 04/23/17 (7.5-325) #100. Ins only allows max. 7 days.  Please re-write.

## 2017-04-29 ENCOUNTER — Telehealth: Payer: Self-pay | Admitting: *Deleted

## 2017-04-29 ENCOUNTER — Encounter: Payer: Self-pay | Admitting: *Deleted

## 2017-04-29 MED ORDER — HYDROCODONE-ACETAMINOPHEN 7.5-325 MG PO TABS: ORAL_TABLET | ORAL | 0 refills | Status: DC

## 2017-04-29 MED ORDER — HYDROCODONE-ACETAMINOPHEN 7.5-325 MG PO TABS: 1.0000 | ORAL_TABLET | Freq: Four times a day (QID) | ORAL | 0 refills | Status: DC | PRN

## 2017-04-29 NOTE — Telephone Encounter (Signed)
Needs prescription for  Hydrocodone 7.5-325 mg 7 DAY

## 2017-04-30 ENCOUNTER — Ambulatory Visit (INDEPENDENT_AMBULATORY_CARE_PROVIDER_SITE_OTHER): Payer: PRIVATE HEALTH INSURANCE | Admitting: Sports Medicine

## 2017-04-30 ENCOUNTER — Encounter: Payer: Self-pay | Admitting: Sports Medicine

## 2017-04-30 DIAGNOSIS — M25562 Pain in left knee: Secondary | ICD-10-CM

## 2017-04-30 DIAGNOSIS — F5101 Primary insomnia: Secondary | ICD-10-CM | POA: Diagnosis not present

## 2017-04-30 DIAGNOSIS — G8929 Other chronic pain: Secondary | ICD-10-CM | POA: Diagnosis not present

## 2017-04-30 DIAGNOSIS — G4733 Obstructive sleep apnea (adult) (pediatric): Secondary | ICD-10-CM | POA: Insufficient documentation

## 2017-04-30 DIAGNOSIS — G4719 Other hypersomnia: Secondary | ICD-10-CM | POA: Diagnosis not present

## 2017-04-30 DIAGNOSIS — R1013 Epigastric pain: Secondary | ICD-10-CM

## 2017-04-30 DIAGNOSIS — E781 Pure hyperglyceridemia: Secondary | ICD-10-CM

## 2017-04-30 MED ORDER — ICOSAPENT ETHYL 1 G PO CAPS: 1.0000 | ORAL_CAPSULE | Freq: Two times a day (BID) | ORAL | 3 refills | Status: DC

## 2017-04-30 MED ORDER — PROMETHAZINE HCL 25 MG PO TABS: 25.0000 mg | ORAL_TABLET | Freq: Three times a day (TID) | ORAL | 0 refills | Status: DC | PRN

## 2017-04-30 MED ORDER — ZALEPLON 10 MG PO CAPS: 20.0000 mg | ORAL_CAPSULE | Freq: Every evening | ORAL | 0 refills | Status: DC | PRN

## 2017-04-30 NOTE — Assessment & Plan Note (Signed)
Most likely related to fatty liver disease.

## 2017-04-30 NOTE — Assessment & Plan Note (Addendum)
Self discontinued atorvastatin and fenofibrate Triglycerides are in the 600s, adding Vascepa.

## 2017-04-30 NOTE — Progress Notes (Signed)
  Subjective:    CC: Follow-up  HPI: Insomnia: Moderate response to Sonata.  Excessive daytime sleepiness: With snoring, moderate obesity, large neck circumference, has never had a sleep study.  Morbid obesity: Tells me he is eating nearly nothing, still no weight loss.   Hypertriglyceridemia: With triglycerides over 600, self discontinued his atorvastatin and fenofibrate.  Left knee pain: With intra-articular loose bodies in a previous MRI, patellofemoral arthritis, has failed physical therapy, injections, NSAIDs. He was seeing [place]opedic surgeon in [place]. No arthroscopy has been performed.  Past medical history:  Negative.  See flowsheet/record as well for more information.  Surgical history: Negative.  See flowsheet/record as well for more information.  Family history: Negative.  See flowsheet/record as well for more information.  Social history: Negative.  See flowsheet/record as well for more information.  Allergies, and medications have been entered into the medical record, reviewed, and no changes needed.   Review of Systems: No fevers, chills, night sweats, weight loss, chest pain, or shortness of breath.   Objective:    General: Well Developed, well nourished, and in no acute distress.  Neuro: Alert and oriented x3, extra-ocular muscles intact, sensation grossly intact.  HEENT: Normocephalic, atraumatic, pupils equal round reactive to light, neck supple, no masses, no lymphadenopathy, thyroid nonpalpable.  Skin: Warm and dry, no rashes. Cardiac: Regular rate and rhythm, no murmurs rubs or gallops, no lower extremity edema.  Respiratory: Clear to auscultation bilaterally. Not using accessory muscles, speaking in full sentences.  Impression and Recommendations:    Chronic epigastric pain Most likely related to fatty liver disease.  Morbid obesity (HCC) I did discuss bariatric surgery options, he is going to try another month of weight loss, if he doesn't lose then  we will do the referral.  Insomnia Moderate response, refilling medication.  Left knee pain with intra-articular loose body Referral to another for a second opinion from another orthopedist to consider arthroscopy, he has been seen [place]opedic [place] in [place], he also had an MRI that showed intra-articular loose bodies.  Persistent pain in spite of injections, physical therapy, has never had an arthroscopy.  Hypertriglyceridemia Self discontinued atorvastatin and fenofibrate Triglycerides are in the 600s, adding Vascepa.  Excessive daytime sleepiness Referral for home sleep study, morbid obesity, neck circumference 18 inches.  I spent 40 minutes with this patient, greater than 50% was face-to-face time counseling regarding the above diagnoses ___________________________________________ Ihor Austinhomas J. Benjamin Stainhekkekandam, M.D., ABFM., CAQSM. Primary Care and Sports Medicine [facility]  Adjunct Instructor of Family Medicine  [employer]

## 2017-04-30 NOTE — Assessment & Plan Note (Signed)
I did discuss bariatric surgery options, he is going to try another month of weight loss, if he doesn't lose then we will do the referral.

## 2017-04-30 NOTE — Assessment & Plan Note (Signed)
Moderate response, refilling medication.

## 2017-04-30 NOTE — Assessment & Plan Note (Signed)
Referral for home sleep study, morbid obesity, neck circumference 18 inches.

## 2017-04-30 NOTE — Assessment & Plan Note (Signed)
Referral to another for a second opinion from another orthopedist to consider arthroscopy, he has been [place]opedic [place], he also had an MRI that showed intra-articular loose bodies.  Persistent pain in spite of injections, physical therapy, has never had an arthroscopy.

## 2017-05-06 NOTE — Telephone Encounter (Signed)
I spoke with the patient and relayed Dr. Sanjuan DameKeeling's message.  He will need to get further pain medication from his family doctor.  Dr. Hilda LiasKeeling re=wrote his Rx for 7 days only.

## 2017-05-10 ENCOUNTER — Ambulatory Visit: Payer: PRIVATE HEALTH INSURANCE | Admitting: Family Medicine

## 2017-05-18 ENCOUNTER — Other Ambulatory Visit: Payer: Self-pay | Admitting: Sports Medicine

## 2017-05-18 DIAGNOSIS — F411 Generalized anxiety disorder: Secondary | ICD-10-CM

## 2017-05-18 MED ORDER — ALPRAZOLAM 1 MG PO TABS: 1.0000 mg | ORAL_TABLET | Freq: Three times a day (TID) | ORAL | 0 refills | Status: DC | PRN

## 2017-05-31 ENCOUNTER — Ambulatory Visit: Payer: PRIVATE HEALTH INSURANCE | Admitting: Sports Medicine

## 2017-06-10 ENCOUNTER — Ambulatory Visit: Payer: 59 | Admitting: Orthopaedic Surgery

## 2017-06-14 ENCOUNTER — Other Ambulatory Visit: Payer: Self-pay | Admitting: Sports Medicine

## 2017-06-14 DIAGNOSIS — F411 Generalized anxiety disorder: Secondary | ICD-10-CM

## 2017-06-16 ENCOUNTER — Other Ambulatory Visit: Payer: Self-pay

## 2017-06-16 MED ORDER — ALPRAZOLAM 1 MG PO TABS: 1.0000 mg | ORAL_TABLET | Freq: Three times a day (TID) | ORAL | 0 refills | Status: DC | PRN

## 2017-06-16 MED ORDER — PROMETHAZINE HCL 25 MG PO TABS: 25.0000 mg | ORAL_TABLET | Freq: Three times a day (TID) | ORAL | 0 refills | Status: DC | PRN

## 2017-07-19 ENCOUNTER — Other Ambulatory Visit: Payer: Self-pay | Admitting: Sports Medicine

## 2017-07-19 DIAGNOSIS — F411 Generalized anxiety disorder: Secondary | ICD-10-CM

## 2017-08-28 ENCOUNTER — Other Ambulatory Visit: Payer: Self-pay | Admitting: Sports Medicine

## 2017-08-28 DIAGNOSIS — F5101 Primary insomnia: Secondary | ICD-10-CM

## 2017-08-28 DIAGNOSIS — F411 Generalized anxiety disorder: Secondary | ICD-10-CM

## 2017-09-23 ENCOUNTER — Other Ambulatory Visit: Payer: Self-pay | Admitting: Sports Medicine

## 2017-09-23 DIAGNOSIS — F411 Generalized anxiety disorder: Secondary | ICD-10-CM

## 2017-10-01 ENCOUNTER — Other Ambulatory Visit: Payer: Self-pay | Admitting: Sports Medicine

## 2017-10-01 DIAGNOSIS — F411 Generalized anxiety disorder: Secondary | ICD-10-CM

## 2017-10-14 ENCOUNTER — Other Ambulatory Visit: Payer: Self-pay | Admitting: Sports Medicine

## 2017-10-26 ENCOUNTER — Other Ambulatory Visit: Payer: Self-pay | Admitting: Sports Medicine

## 2017-10-26 DIAGNOSIS — F411 Generalized anxiety disorder: Secondary | ICD-10-CM

## 2017-11-22 ENCOUNTER — Other Ambulatory Visit: Payer: Self-pay | Admitting: Sports Medicine

## 2017-11-22 DIAGNOSIS — F411 Generalized anxiety disorder: Secondary | ICD-10-CM

## 2017-12-07 ENCOUNTER — Other Ambulatory Visit: Payer: Self-pay | Admitting: Sports Medicine

## 2017-12-07 DIAGNOSIS — F5101 Primary insomnia: Secondary | ICD-10-CM

## 2017-12-20 ENCOUNTER — Other Ambulatory Visit: Payer: Self-pay | Admitting: Sports Medicine

## 2017-12-20 DIAGNOSIS — F411 Generalized anxiety disorder: Secondary | ICD-10-CM

## 2017-12-22 ENCOUNTER — Ambulatory Visit (INDEPENDENT_AMBULATORY_CARE_PROVIDER_SITE_OTHER): Payer: PRIVATE HEALTH INSURANCE | Admitting: Sports Medicine

## 2017-12-22 ENCOUNTER — Encounter: Payer: Self-pay | Admitting: Sports Medicine

## 2017-12-22 DIAGNOSIS — G4733 Obstructive sleep apnea (adult) (pediatric): Secondary | ICD-10-CM

## 2017-12-22 DIAGNOSIS — E781 Pure hyperglyceridemia: Secondary | ICD-10-CM | POA: Diagnosis not present

## 2017-12-22 DIAGNOSIS — I872 Venous insufficiency (chronic) (peripheral): Secondary | ICD-10-CM | POA: Diagnosis not present

## 2017-12-22 DIAGNOSIS — F528 Other sexual dysfunction not due to a substance or known physiological condition: Secondary | ICD-10-CM | POA: Diagnosis not present

## 2017-12-22 DIAGNOSIS — F411 Generalized anxiety disorder: Secondary | ICD-10-CM | POA: Diagnosis not present

## 2017-12-22 MED ORDER — ALPRAZOLAM 1 MG PO TABS: 1.0000 mg | ORAL_TABLET | Freq: Every day | ORAL | 0 refills | Status: DC | PRN

## 2017-12-22 MED ORDER — ICOSAPENT ETHYL 1 G PO CAPS: 1.0000 | ORAL_CAPSULE | Freq: Two times a day (BID) | ORAL | 3 refills | Status: DC

## 2017-12-22 NOTE — Patient Instructions (Signed)

## 2017-12-22 NOTE — Assessment & Plan Note (Signed)
Has not been compliant with the Vascepa. Trigs over 600. Refilling this, recheck lipids in 3 months.

## 2017-12-22 NOTE — Assessment & Plan Note (Signed)
Needs to get lower extremity compression hose, bilateral graduated knee-high. We will further evaluate with a venous reflux ultrasound and a future visit if no better.

## 2017-12-22 NOTE — Assessment & Plan Note (Signed)
Refilling alprazolam, 30-month supply given.

## 2017-12-22 NOTE — Progress Notes (Signed)
Subjective:    CC: Several issues  HPI: Discoloration of feet: Bilateral, small brown spots, minimal itching, mild leg swelling bilateral and symmetric particularly when on his feet for a long time.  Not wearing any compression hose.  No orthopnea, PND.  No chest pain.  No shortness of breath.  Anxiety: Needs a refill on alprazolam.  Uses approximately 1 tab per day.  Hypertriglyceridemia: Self discontinued Vascepa, also discontinued his atorvastatin and fenofibrate.  I reviewed the past medical history, family history, social history, surgical history, and allergies today and no changes were needed.  Please see the problem list section below in epic for further details.  Past Medical History: Past Medical History:  Diagnosis Date  . Anxiety   . Asthma    mild, intermittent  . Depression   . Hypertension   . Knee injury    Past Surgical History: Past Surgical History:  Procedure Laterality Date  . DENTAL SURGERY    . PILONIDAL CYST EXCISION N/A 04/12/2015   Procedure: CYST EXCISION PILONIDAL SIMPLE;  Surgeon: Franky Macho, MD;  Location: [facility];  Service: General;  Laterality: N/A;  . TONSILECTOMY, ADENOIDECTOMY, BILATERAL MYRINGOTOMY AND TUBES     Social History: Social History   Socioeconomic History  . Marital status: Married    Spouse name: Not on file  . Number of children: Not on file  . Years of education: Not on file  . Highest education level: Not on file  Occupational History  . Not on file  Social Needs  . Financial resource strain: Not on file  . Food insecurity:    Worry: Not on file    Inability: Not on file  . Transportation needs:    Medical: Not on file    Non-medical: Not on file  Tobacco Use  . Smoking status: Current Some Day Smoker    Packs/day: 0.30    Years: 4.00    Pack years: 1.20    Types: Cigarettes    Last attempt to quit: 10/22/2005    Years since quitting: 12.1  . Smokeless tobacco: Never Used  Substance and Sexual Activity  .  Alcohol use: Yes    Comment: 2 drinks a day  . Drug use: No  . 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 Physical activity:    Days per week: Not on file    Minutes per session: Not on file  . Stress: Not on file  Relationships  . Social connections:    Talks on phone: Not on file    Gets together: Not on file    Attends religious service: Not on file    Active member of club or organization: Not on file    Attends meetings of clubs or organizations: Not on file    Relationship status: Not on file  Other Topics Concern  . Not on file  Social History Narrative  . Not on file   Family History: Family History  Problem Relation Age of Onset  . Hypertension Mother   . Hyperlipidemia Mother   . Depression Father        committed suicide   Allergies: No Known Allergies Medications: See med rec.  Review of Systems: No fevers, chills, night sweats, weight loss, chest pain, or shortness of breath.   Objective:    General: Well Developed, well nourished, and in no acute distress.  Neuro:  Alert and oriented x3, extra-ocular muscles intact, sensation grossly intact.  HEENT: Normocephalic, atraumatic, pupils equal round reactive to light, neck supple, no masses, no lymphadenopathy, thyroid nonpalpable.  Skin: Warm and dry, no rashes. Cardiac: Regular rate and rhythm, no murmurs rubs or gallops, 1+ lower extremity pitting edema with hemosiderin deposits over the dorsum of the feet.  Good pulses peripherally.  Negative Homans sign bilaterally. Respiratory: Clear to auscultation bilaterally. Not using accessory muscles, speaking in full sentences.  Impression and Recommendations:    Anxiety state Refilling alprazolam, 59-month supply given.  Hypertriglyceridemia Has not been compliant with the Vascepa. Trigs over 600. Refilling this, recheck lipids in 3 months.  Venous stasis  dermatitis of both lower extremities Needs to get lower extremity compression hose, bilateral graduated knee-high. We will further evaluate with a venous reflux ultrasound and a future visit if no better.  I spent 25 minutes with this patient, greater than 50% was face-to-face time counseling regarding the above diagnoses ___________________________________________ [name]. [name], M.D., ABFM., CAQSM. Primary Care and Sports Medicine [facility]  Adjunct Instructor of Family Medicine  [employer]

## 2018-01-23 ENCOUNTER — Other Ambulatory Visit: Payer: Self-pay | Admitting: Sports Medicine

## 2018-02-19 ENCOUNTER — Other Ambulatory Visit: Payer: Self-pay | Admitting: Sports Medicine

## 2018-03-22 ENCOUNTER — Other Ambulatory Visit: Payer: Self-pay | Admitting: Sports Medicine

## 2018-03-22 DIAGNOSIS — F411 Generalized anxiety disorder: Secondary | ICD-10-CM

## 2018-03-23 ENCOUNTER — Other Ambulatory Visit: Payer: Self-pay | Admitting: Sports Medicine

## 2018-03-23 DIAGNOSIS — F5101 Primary insomnia: Secondary | ICD-10-CM

## 2018-03-24 ENCOUNTER — Ambulatory Visit: Payer: PRIVATE HEALTH INSURANCE | Admitting: Sports Medicine

## 2018-03-31 ENCOUNTER — Ambulatory Visit: Payer: PRIVATE HEALTH INSURANCE | Admitting: Sports Medicine

## 2018-04-04 ENCOUNTER — Other Ambulatory Visit: Payer: Self-pay | Admitting: Sports Medicine

## 2018-04-04 DIAGNOSIS — I10 Essential (primary) hypertension: Secondary | ICD-10-CM

## 2018-04-28 ENCOUNTER — Other Ambulatory Visit: Payer: Self-pay

## 2018-04-28 ENCOUNTER — Emergency Department (INDEPENDENT_AMBULATORY_CARE_PROVIDER_SITE_OTHER)
Admission: EM | Admit: 2018-04-28 | Discharge: 2018-04-28 | Disposition: A | Discharge status: Home / Self Care | Payer: PRIVATE HEALTH INSURANCE | Source: Home / Self Care | Attending: Family Medicine | Admitting: Family Medicine

## 2018-04-28 ENCOUNTER — Encounter: Payer: Self-pay | Admitting: Emergency Medicine

## 2018-04-28 ENCOUNTER — Emergency Department (INDEPENDENT_AMBULATORY_CARE_PROVIDER_SITE_OTHER): Payer: PRIVATE HEALTH INSURANCE

## 2018-04-28 DIAGNOSIS — J40 Bronchitis, not specified as acute or chronic: Secondary | ICD-10-CM

## 2018-04-28 DIAGNOSIS — R05 Cough: Secondary | ICD-10-CM | POA: Diagnosis not present

## 2018-04-28 MED ORDER — GUAIFENESIN-CODEINE 100-10 MG/5ML PO SOLN: ORAL | 0 refills | Status: DC

## 2018-04-28 MED ORDER — PREDNISONE 20 MG PO TABS: ORAL_TABLET | ORAL | 0 refills | Status: DC

## 2018-04-28 MED ORDER — METHYLPREDNISOLONE SODIUM SUCC 125 MG IJ SOLR
80.0000 mg | Freq: Once | INTRAMUSCULAR | Status: AC
  Administered 2018-04-28: 80 mg via INTRAMUSCULAR

## 2018-04-28 MED ORDER — ALBUTEROL SULFATE HFA 108 (90 BASE) MCG/ACT IN AERS: 2.0000 | INHALATION_SPRAY | Freq: Four times a day (QID) | RESPIRATORY_TRACT | 0 refills | Status: DC | PRN

## 2018-04-28 MED ORDER — AZITHROMYCIN 250 MG PO TABS: ORAL_TABLET | ORAL | 0 refills | Status: DC

## 2018-04-28 MED ORDER — IPRATROPIUM-ALBUTEROL 0.5-2.5 (3) MG/3ML IN SOLN
3.0000 mL | Freq: Once | RESPIRATORY_TRACT | Status: AC
  Administered 2018-04-28: 3 mL via RESPIRATORY_TRACT

## 2018-04-28 NOTE — ED Triage Notes (Signed)
Cough and congestion x 1 week, fever, chills, sweats x 4 days

## 2018-04-28 NOTE — ED Provider Notes (Signed)
[facility]    CSN: [id number] Arrival date & time: 04/28/18  1425     History   Chief Complaint Chief Complaint  Patient presents with  . URI    HPI Philip Cuevas is a 37 y.o. male.   About 8 days ago patient developed mild sinus congestion and brief sore throat.  One week ago he awoke with tightness in his anterior chest and a non-productive cough.  He has had persistent daily fever 101+ to 103 +, with chills/sweats.  This morning his fever was 101.5.  He has been fatigued with decreased appetite.  He has had shortness of breath and wheezing with activity, but no pleuritic pain.  He tried taking some leftover antibiotic for 3 days without improvement. He has a past history of exercise induced asthma.  He had pneumonia two years ago.  The history is provided by the patient.    Past Medical History:  Diagnosis Date  . Anxiety   . Asthma    mild, intermittent  . Depression   . Hypertension   . Knee injury     Patient Active Problem List   Diagnosis Date Noted  . Venous stasis dermatitis of both lower extremities 12/22/2017  . Morbid obesity (HCC) 04/30/2017  . Obstructive sleep apnea 04/30/2017  . Hypertriglyceridemia 03/03/2017  . Chronic epigastric pain 03/02/2017  . Left knee pain with intra-articular loose body 05/02/2012  . Benign essential hypertension 05/07/2008  . ERECTILE DYSFUNCTION 07/06/2007  . ASTHMA, EXERCISE INDUCED 11/24/2006  . Insomnia 06/04/2006  . Anxiety state 06/01/2006    Past Surgical History:  Procedure Laterality Date  . DENTAL SURGERY    . PILONIDAL CYST EXCISION N/A 04/12/2015   Procedure: CYST EXCISION PILONIDAL SIMPLE;  Surgeon: Franky Macho, MD;  Location: [facility];  Service: General;  Laterality: N/A;  . TONSILECTOMY, ADENOIDECTOMY, BILATERAL MYRINGOTOMY AND TUBES         Home Medications    Prior to Admission medications   Medication Sig Start Date End Date Taking? Authorizing Provider  doxycycline (DORYX)  100 MG EC tablet Take 100 mg by mouth 2 (two) times daily.   Yes [provider]  Pseudoephedrine-APAP-DM (DAYQUIL MULTI-SYMPTOM COLD/FLU PO) Take by mouth.   Yes [provider]  albuterol (VENTOLIN HFA) 108 (90 Base) MCG/ACT inhaler Inhale 2 puffs into the lungs every 6 (six) hours as needed. 04/28/18   Lattie Haw, MD  ALPRAZolam Prudy Feeler) 1 MG tablet TAKE ONE TABLET BY MOUTH DAILY 03/22/18   Monica Becton, MD  azithromycin (ZITHROMAX Z-PAK) 250 MG tablet Take 2 tabs today; then begin one tab once daily for 4 more days. 04/28/18   Lattie Haw, MD  guaiFENesin-codeine 100-10 MG/5ML syrup Take 54mL by mouth at bedtime as needed for cough.  May repeat dose in 4 to 6 hours. 04/28/18   Lattie Haw, MD  Icosapent Ethyl 1 g CAPS Take 1 capsule (1 g total) by mouth 2 (two) times daily. 12/22/17   Monica Becton, MD  olmesartan-hydrochlorothiazide (BENICAR HCT) 40-25 MG tablet TAKE 1 TABLET BY MOUTH DAILY 04/04/18   Monica Becton, MD  predniSONE (DELTASONE) 20 MG tablet Take one tab by mouth twice daily for 4 days, then one daily for 3 days. Take with food. 04/28/18   Lattie Haw, MD  tadalafil (CIALIS) 20 MG tablet Take 1 tablet (20 mg total) by mouth daily as needed. 03/25/17   Monica Becton, MD  zaleplon (SONATA) 10 MG  capsule TAKE 2 CAPSULES BY MOUTH EVERY NIGHT AT BEDTIME AS NEEDED FOR SLEEP 03/24/18   Monica Becton, MD    Family History Family History  Problem Relation Age of Onset  . Hypertension Mother   . Hyperlipidemia Mother   . Depression Father        committed suicide    Social History Social History   Tobacco Use  . Smoking status: Current Some Day Smoker    Packs/day: 0.30    Years: 4.00    Pack years: 1.20    Types: Cigarettes    Last attempt to quit: 10/22/2005    Years since quitting: 12.5  . Smokeless tobacco: Never Used  Substance Use Topics  . Alcohol use: Yes    Comment: 2 drinks a day  . Drug use: No       Allergies   Patient has no known allergies.   Review of Systems Review of Systems + sore throat + cough No pleuritic pain + wheezing + nasal congestion + post-nasal drainage No sinus pain/pressure No itchy/red eyes No earache No hemoptysis + SOB + fever, + chills/sweats No nausea No vomiting No abdominal pain No diarrhea No urinary symptoms No skin rash + fatigue + myalgias No headache Used OTC meds without relief   Physical Exam Triage Vital Signs ED Triage Vitals  Enc Vitals Group     BP 04/28/18 1446 (!) 145/89     Pulse Rate 04/28/18 1446 (!) 114     Resp --      Temp 04/28/18 1446 98.8 F (37.1 C)     Temp Source 04/28/18 1446 Oral     SpO2 04/28/18 1446 96 %     Weight 04/28/18 1447 255 lb (115.7 kg)     Height 04/28/18 1447 5\' 11"  (1.803 m)     Head Circumference --      Peak Flow --      Pain Score 04/28/18 1447 0     Pain Loc --      Pain Edu? --      Excl. in GC? --    No data found.  Updated Vital Signs BP (!) 145/89 (BP Location: Right Arm)   Pulse (!) 114   Temp 98.8 F (37.1 C) (Oral)   Ht 5\' 11"  (1.803 m)   Wt 115.7 kg   SpO2 96%   BMI 35.57 kg/m   Visual Acuity Right Eye Distance:   Left Eye Distance:   Bilateral Distance:    Right Eye Near:   Left Eye Near:    Bilateral Near:     Physical Exam Nursing notes and Vital Signs reviewed. Appearance:  Patient appears stated age, and in no acute distress Eyes:  Pupils are equal, round, and reactive to light and accomodation.  Extraocular movement is intact.  Conjunctivae are not inflamed  Ears:  Canals normal.  Tympanic membranes normal.  Nose:  Mildly congested turbinates.  No sinus tenderness.    Pharynx:  Normal Neck:  Supple.  Enlarged posterior/lateral nodes are palpated bilaterally, tender to palpation on the left.   Lungs:  Bilateral expiratory wheezes, heard best anteriorly.  Scattered rhonchi and faint rales posteriorly.  Breath sounds are equal.  Moving air  well. Heart:  Regular rate and rhythm without murmurs, rubs, or gallops.  Rate 112 Abdomen:  Nontender without masses or hepatosplenomegaly.  Bowel sounds are present.  No CVA or flank tenderness.  Extremities:  No edema.  Skin:  No rash present.  UC Treatments / Results  Labs (all labs ordered are listed, but only abnormal results are displayed) Labs Reviewed  POCT CBC W AUTO DIFF ([facility]):  WBC 7.6; LY 12.5; MO 3.8; GR 83.7; Hgb 16.4; Platelets 213     EKG None  Radiology Dg Chest 2 View  Result Date: 04/28/2018 CLINICAL DATA:  Patient with productive cough EXAM: CHEST - 2 VIEW COMPARISON:  Chest radiograph 04/05/2012 FINDINGS: Stable cardiac and mediastinal contours. Bilateral coarse interstitial opacities. No pleural effusion or pneumothorax. Unremarkable osseous structures. IMPRESSION: Bilateral interstitial pulmonary opacities concerning for atypical infection or bronchitis in the appropriate clinical setting. Electronically Signed   By: Annia Belt M.D.   On: 04/28/2018 16:08    Procedures Procedures (including critical care time)  Medications Ordered in UC Medications  methylPREDNISolone sodium succinate (SOLU-MEDROL) 125 mg/2 mL injection 80 mg (has no administration in time range)  ipratropium-albuterol (DUONEB) 0.5-2.5 (3) MG/3ML nebulizer solution 3 mL (3 mLs Nebulization Given 04/28/18 1535)    Initial Impression / Assessment and Plan / UC Course  I have reviewed the triage vital signs and the nursing notes.  Pertinent labs & imaging results that were available during my care of the patient were reviewed by me and considered in my medical decision making (see chart for details).    Note "bilateral interstitial pulmonary opacities concerning for atypical infection."  Normal WBC (7.6) reassuring. Administered Solumedrol 80mg  IM.  Administered DuoNeb by hand held nebulizer  Begin Z-pak for atypical coverage. Rx for Robitussin AC for night time cough.    Controlled Substance Prescriptions I have consulted the [place] Controlled Substances Registry for this patient, and feel the risk/benefit ratio today is favorable for proceeding with this prescription for a controlled substance.   Refill albuterol MDI Followup with Family Doctor if not improved in one week.    Final Clinical Impressions(s) / UC Diagnoses   Final diagnoses:  Bronchitis     Discharge Instructions     Begin prednisone Friday 04/29/18. Take plain guaifenesin (1200mg  extended release tabs such as Mucinex) twice daily, with plenty of water, for cough and congestion.  Get adequate rest.   May use Afrin nasal spray (or generic oxymetazoline) each morning for about 5 days and then discontinue.  Also recommend using saline nasal spray several times daily and saline nasal irrigation (AYR is a common brand).  Use Flonase nasal spray each morning after using Afrin nasal spray and saline nasal irrigation. Try warm salt water gargles for sore throat.  Stop all antihistamines for now, and other non-prescription cough/cold preparations.        ED Prescriptions    Medication Sig Dispense Auth. Provider   azithromycin (ZITHROMAX Z-PAK) 250 MG tablet Take 2 tabs today; then begin one tab once daily for 4 more days. 6 tablet Lattie Haw, MD   predniSONE (DELTASONE) 20 MG tablet Take one tab by mouth twice daily for 4 days, then one daily for 3 days. Take with food. 11 tablet Lattie Haw, MD   guaiFENesin-codeine 100-10 MG/5ML syrup Take 10mL by mouth at bedtime as needed for cough.  May repeat dose in 4 to 6 hours. 100 mL Lattie Haw, MD   albuterol (VENTOLIN HFA) 108 (90 Base) MCG/ACT inhaler Inhale 2 puffs into the lungs every 6 (six) hours as needed. 1 Inhaler Lattie Haw, MD        Lattie Haw, MD 04/29/18 2013

## 2018-04-28 NOTE — Discharge Instructions (Addendum)
Begin prednisone Friday 04/29/18. Take plain guaifenesin (1200mg  extended release tabs such as Mucinex) twice daily, with plenty of water, for cough and congestion.  Get adequate rest.   May use Afrin nasal spray (or generic oxymetazoline) each morning for about 5 days and then discontinue.  Also recommend using saline nasal spray several times daily and saline nasal irrigation (AYR is a common brand).  Use Flonase nasal spray each morning after using Afrin nasal spray and saline nasal irrigation. Try warm salt water gargles for sore throat.  Stop all antihistamines for now, and other non-prescription cough/cold preparations.

## 2018-06-29 ENCOUNTER — Other Ambulatory Visit: Payer: Self-pay | Admitting: *Deleted

## 2018-06-29 ENCOUNTER — Other Ambulatory Visit: Payer: Self-pay | Admitting: Sports Medicine

## 2018-06-29 DIAGNOSIS — F411 Generalized anxiety disorder: Secondary | ICD-10-CM

## 2018-06-29 DIAGNOSIS — F5101 Primary insomnia: Secondary | ICD-10-CM

## 2018-06-30 MED ORDER — ZALEPLON 10 MG PO CAPS: 10.0000 mg | ORAL_CAPSULE | Freq: Every day | ORAL | 0 refills | Status: DC

## 2018-06-30 MED ORDER — TADALAFIL 20 MG PO TABS: 20.0000 mg | ORAL_TABLET | Freq: Every day | ORAL | 0 refills | Status: DC | PRN

## 2018-06-30 MED ORDER — ALPRAZOLAM 1 MG PO TABS: 1.0000 mg | ORAL_TABLET | Freq: Every day | ORAL | 0 refills | Status: DC

## 2018-07-11 ENCOUNTER — Encounter: Payer: Self-pay | Admitting: Sports Medicine

## 2018-07-27 ENCOUNTER — Encounter: Payer: Self-pay | Admitting: Sports Medicine

## 2018-07-28 ENCOUNTER — Encounter: Payer: Self-pay | Admitting: Sports Medicine

## 2018-08-09 ENCOUNTER — Ambulatory Visit (INDEPENDENT_AMBULATORY_CARE_PROVIDER_SITE_OTHER): Payer: PRIVATE HEALTH INSURANCE | Admitting: General Surgery

## 2018-08-09 ENCOUNTER — Encounter: Payer: Self-pay | Admitting: General Surgery

## 2018-08-09 VITALS — BP 129/88 | HR 93 | Temp 98.6°F | Resp 20 | Wt 251.8 lb

## 2018-08-09 DIAGNOSIS — L0591 Pilonidal cyst without abscess: Secondary | ICD-10-CM | POA: Diagnosis not present

## 2018-08-09 MED ORDER — SULFAMETHOXAZOLE-TRIMETHOPRIM 800-160 MG PO TABS: 1.0000 | ORAL_TABLET | Freq: Two times a day (BID) | ORAL | 0 refills | Status: DC

## 2018-08-09 NOTE — Progress Notes (Signed)
Philip HartJonathan W [id number]; [id number]; 10/14/1980   HPI Patient is a 37 year old white male who presented to my care for drainage from the pilonidal region.  He is status post excision of pilonidal cyst in 2016.  He states that over the past few months, he has had intermittent swelling and bloody drainage from the incision.  His last episode was several days ago.  Since that time, he has had only mild pain and tenderness when pressure is applied.  His pain level is 4 out of 10.  He denies any fever or chills. Past Medical History:  Diagnosis Date  . Anxiety   . Asthma    mild, intermittent  . Depression   . Hypertension   . Knee injury     Past Surgical History:  Procedure Laterality Date  . DENTAL SURGERY    . PILONIDAL CYST EXCISION N/A 04/12/2015   Procedure: CYST EXCISION PILONIDAL SIMPLE;  Surgeon: Franky MachoMark Chinelo Benn, MD;  Location: [facility];  Service: General;  Laterality: N/A;  . TONSILECTOMY, ADENOIDECTOMY, BILATERAL MYRINGOTOMY AND TUBES      Family History  Problem Relation Age of Onset  . Hypertension Mother   . Hyperlipidemia Mother   . Depression Father        committed suicide    Current Outpatient Medications on File Prior to Visit  Medication Sig Dispense Refill  . tadalafil (ADCIRCA/CIALIS) 20 MG tablet TAKE 1 TABLET(20 MG) BY MOUTH DAILY AS NEEDED 6 tablet 0  . tadalafil (CIALIS) 20 MG tablet Take 1 tablet (20 mg total) by mouth daily as needed. 6 tablet 0  . zaleplon (SONATA) 10 MG capsule Take 1 capsule (10 mg total) by mouth at bedtime. 60 capsule 0  . albuterol (VENTOLIN HFA) 108 (90 Base) MCG/ACT inhaler Inhale 2 puffs into the lungs every 6 (six) hours as needed. 1 Inhaler 0  . ALPRAZolam (XANAX) 1 MG tablet TAKE 1 TABLET BY MOUTH DAILY 90 tablet 0  . ALPRAZolam (XANAX) 1 MG tablet Take 1 tablet (1 mg total) by mouth daily. 90 tablet 0  . olmesartan-hydrochlorothiazide (BENICAR HCT) 40-25 MG tablet TAKE 1 TABLET BY MOUTH DAILY 90 tablet 0  . promethazine (PHENERGAN) 25 MG  tablet TAKE 1 TABLET(25 MG) BY MOUTH EVERY 8 HOURS AS NEEDED FOR NAUSEA OR VOMITING 30 tablet 0   No current facility-administered medications on file prior to visit.     No Known Allergies  Social History   Substance and Sexual Activity  Alcohol Use Yes   Comment: 2 drinks a day    Social History   Tobacco Use  Smoking Status Current Some Day Smoker  . Packs/day: 0.30  . Years: 4.00  . Pack years: 1.20  . Types: Cigarettes  . Last attempt to quit: 10/22/2005  . Years since quitting: 12.8  Smokeless Tobacco Never Used    Review of Systems  Constitutional: Negative.   HENT: Negative.   Eyes: Negative.   Respiratory: Negative.   Cardiovascular: Negative.   Gastrointestinal: Negative.   Genitourinary: Negative.   Musculoskeletal: Positive for joint pain.  Skin: Negative.   Neurological: Negative.   Endo/Heme/Allergies: Negative.   Psychiatric/Behavioral: Negative.     Objective   Vitals:   08/09/18 1312  BP: 129/88  Pulse: 93  Resp: 20  Temp: 98.6 F (37 C)    Physical Exam Vitals signs reviewed.  Constitutional:      Appearance: Normal appearance. He is not ill-appearing.  HENT:     Head: Normocephalic and atraumatic.  Cardiovascular:  Rate and Rhythm: Normal rate and regular rhythm.     Heart sounds: No murmur. No friction rub. No gallop.   Pulmonary:     Effort: Pulmonary effort is normal. No respiratory distress.     Breath sounds: Normal breath sounds. No stridor. No wheezing, rhonchi or rales.  Skin:    General: Skin is warm and dry.     Findings: No erythema.     Comments: Small punctate healing wound along the midportion of the pilonidal cyst excision scar.  Minimal induration noted.  No significant surrounding erythema.  I could not appreciate fluctuance.  Neurological:     Mental Status: He is alert and oriented to person, place, and time.   Previous operative note reviewed  Assessment  Pilonidal cyst infection, resolving Plan    Bactrim DS 1 tablet twice a day for 10 days has been prescribed.  No need for acute surgical intervention at this time.  Should this recur, he may need reexcision of the pilonidal region.  He will follow-up here expectantly.

## 2018-08-09 NOTE — Patient Instructions (Signed)
Pilonidal Cyst A pilonidal cyst is a fluid-filled sac. It forms beneath the skin near your tailbone, at the top of the crease of your buttocks. A pilonidal cyst that is not large or infected may not cause symptoms or problems. If the cyst becomes irritated or infected, it may fill with pus. This causes pain and swelling (pilonidal abscess). An infected cyst may need to be treated with medicine, drained, or removed. What are the causes? The cause of a pilonidal cyst is not known. One cause may be a hair that grows into your skin (ingrown hair). What increases the risk? Pilonidal cysts are more common in boys and men. Risk factors include:  Having lots of hair near the crease of the buttocks.  Being overweight.  Having a pilonidal dimple.  Wearing tight clothing.  Not bathing or showering frequently.  Sitting for long periods of time.  What are the signs or symptoms? Signs and symptoms of a pilonidal cyst may include:  Redness.  Pain and tenderness.  Warmth.  Swelling.  Pus.  Fever.  How is this diagnosed? Your health care provider may diagnose a pilonidal cyst based on your symptoms and a physical exam. The health care provider may do a blood test to check for infection. If your cyst is draining pus, your health care provider may take a sample of the drainage to be tested at a laboratory. How is this treated? Surgery is the usual treatment for an infected pilonidal cyst. You may also have to take medicines before surgery. The type of surgery you have depends on the size and severity of the infected cyst. The different kinds of surgery include:  Incision and drainage. This is a procedure to open and drain the cyst.  Marsupialization. In this procedure, a large cyst or abscess may be opened and kept open by stitching the edges of the skin to the cyst walls.  Cyst removal. This procedure involves opening the skin and removing all or part of the cyst.  Follow these  instructions at home:  Follow all of your surgeon's instructions carefully if you had surgery.  Take medicines only as directed by your health care provider.  If you were prescribed an antibiotic medicine, finish it all even if you start to feel better.  Keep the area around your pilonidal cyst clean and dry.  Clean the area as directed by your health care provider. Pat the area dry with a clean towel. Do not rub it as this may cause bleeding.  Remove hair from the area around the cyst as directed by your health care provider.  Do not wear tight clothing or sit in one place for long periods of time.  There are many different ways to close and cover an incision, including stitches, skin glue, and adhesive strips. Follow your health care provider's instructions on: ? Incision care. ? Bandage (dressing) changes and removal. ? Incision closure removal. Contact a health care provider if:  You have drainage, redness, swelling, or pain at the site of the cyst.  You have a fever. This information is not intended to replace advice given to you by your health care provider. Make sure you discuss any questions you have with your health care provider. Document Released: 08/07/2000 Document Revised: 01/16/2016 Document Reviewed: 12/28/2013 Elsevier Interactive Patient Education  2018 Elsevier Inc.  

## 2018-08-23 ENCOUNTER — Encounter: Payer: Self-pay | Admitting: Sports Medicine

## 2018-08-29 ENCOUNTER — Ambulatory Visit: Payer: PRIVATE HEALTH INSURANCE | Admitting: Sports Medicine

## 2018-08-29 ENCOUNTER — Encounter: Payer: Self-pay | Admitting: Sports Medicine

## 2018-08-29 ENCOUNTER — Ambulatory Visit (INDEPENDENT_AMBULATORY_CARE_PROVIDER_SITE_OTHER): Payer: PRIVATE HEALTH INSURANCE | Admitting: Sports Medicine

## 2018-08-29 DIAGNOSIS — F528 Other sexual dysfunction not due to a substance or known physiological condition: Secondary | ICD-10-CM

## 2018-08-29 DIAGNOSIS — F411 Generalized anxiety disorder: Secondary | ICD-10-CM

## 2018-08-29 DIAGNOSIS — I10 Essential (primary) hypertension: Secondary | ICD-10-CM

## 2018-08-29 DIAGNOSIS — Z8669 Personal history of other diseases of the nervous system and sense organs: Secondary | ICD-10-CM | POA: Diagnosis not present

## 2018-08-29 DIAGNOSIS — F5101 Primary insomnia: Secondary | ICD-10-CM

## 2018-08-29 DIAGNOSIS — E781 Pure hyperglyceridemia: Secondary | ICD-10-CM | POA: Diagnosis not present

## 2018-08-29 MED ORDER — ICOSAPENT ETHYL 1 G PO CAPS: 1.0000 | ORAL_CAPSULE | Freq: Two times a day (BID) | ORAL | 3 refills | Status: DC

## 2018-08-29 MED ORDER — ALBUTEROL SULFATE HFA 108 (90 BASE) MCG/ACT IN AERS: 2.0000 | INHALATION_SPRAY | Freq: Four times a day (QID) | RESPIRATORY_TRACT | 0 refills | Status: DC | PRN

## 2018-08-29 MED ORDER — RIZATRIPTAN BENZOATE 5 MG PO TBDP: 5.0000 mg | ORAL_TABLET | ORAL | 3 refills | Status: DC | PRN

## 2018-08-29 MED ORDER — PROMETHAZINE HCL 25 MG PO TABS: ORAL_TABLET | ORAL | 0 refills | Status: DC

## 2018-08-29 MED ORDER — TOPIRAMATE 50 MG PO TABS: ORAL_TABLET | ORAL | 3 refills | Status: DC

## 2018-08-29 MED ORDER — TADALAFIL 20 MG PO TABS: 20.0000 mg | ORAL_TABLET | Freq: Every day | ORAL | 0 refills | Status: DC | PRN

## 2018-08-29 MED ORDER — OLMESARTAN MEDOXOMIL-HCTZ 40-25 MG PO TABS: 1.0000 | ORAL_TABLET | Freq: Every day | ORAL | 0 refills | Status: DC

## 2018-08-29 MED ORDER — ZALEPLON 10 MG PO CAPS: 10.0000 mg | ORAL_CAPSULE | Freq: Every day | ORAL | 0 refills | Status: DC

## 2018-08-29 MED ORDER — ALPRAZOLAM 1 MG PO TABS: 1.0000 mg | ORAL_TABLET | Freq: Every day | ORAL | 0 refills | Status: DC

## 2018-08-29 NOTE — Assessment & Plan Note (Signed)
Rechecking routine labs. Historically has been noncompliant with vaccine for, triglycerides have been over 600 in the past.

## 2018-08-29 NOTE — Assessment & Plan Note (Signed)
3 migraines per month that each lasted a day. FMLA paperwork filled out for his headaches. Adding low-dose Topamax, as well as Maxalt for migraine abortion.

## 2018-08-29 NOTE — Assessment & Plan Note (Signed)
Rechecking testosterone, refilling Cialis

## 2018-08-29 NOTE — Progress Notes (Signed)
Subjective:    CC: Follow-up  HPI: Migraines occur approximately 3 times per month, he needs to be out of work and needs FMLA paperwork filled out, has not done any preventive medicine yet.  I reviewed the past medical history, family history, social history, surgical history, and allergies today and no changes were needed.  Please see the problem list section below in epic for further details.  Past Medical History: Past Medical History:  Diagnosis Date  . Anxiety   . Asthma    mild, intermittent  . Depression   . Hypertension   . Knee injury    Past Surgical History: Past Surgical History:  Procedure Laterality Date  . DENTAL SURGERY    . PILONIDAL CYST EXCISION N/A 04/12/2015   Procedure: CYST EXCISION PILONIDAL SIMPLE;  Surgeon: Franky MachoMark Jenkins, MD;  Location: [facility];  Service: General;  Laterality: N/A;  . TONSILECTOMY, ADENOIDECTOMY, BILATERAL MYRINGOTOMY AND TUBES     Social History: Social History   Socioeconomic History  . Marital status: Married    Spouse name: Not on file  . Number of children: Not on file  . Years of education: Not on file  . Highest education level: Not on file  Occupational History  . Not on file  Social Needs  . Financial resource strain: Not on file  . Food insecurity:    Worry: Not on file    Inability: Not on file  . Transportation needs:    Medical: Not on file    Non-medical: Not on file  Tobacco Use  . Smoking status: Current Some Day Smoker    Packs/day: 0.30    Years: 4.00    Pack years: 1.20    Types: Cigarettes    Last attempt to quit: 10/22/2005    Years since quitting: 12.8  . Smokeless tobacco: Never Used  Substance and Sexual Activity  . Alcohol use: Yes    Comment: 2 drinks a day  . Drug use: No  . 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 Physical activity:    Days per week: Not on file    Minutes per session:  Not on file  . Stress: Not on file  Relationships  . Social connections:    Talks on phone: Not on file    Gets together: Not on file    Attends religious service: Not on file    Active member of club or organization: Not on file    Attends meetings of clubs or organizations: Not on file    Relationship status: Not on file  Other Topics Concern  . Not on file  Social History Narrative  . Not on file   Family History: Family History  Problem Relation Age of Onset  . Hypertension Mother   . Hyperlipidemia Mother   . Depression Father        committed suicide   Allergies: No Known Allergies Medications: See med rec.  Review of Systems: No fevers, chills, night sweats, weight loss, chest pain, or shortness of breath.   Objective:    General: Well Developed, well nourished, and in no acute distress.  Neuro: Alert and oriented x3, extra-ocular muscles intact, sensation grossly intact.  HEENT: Normocephalic, atraumatic, pupils equal round reactive to light, neck supple, no masses, no lymphadenopathy, thyroid nonpalpable.  Skin: Warm and dry, no rashes. Cardiac: Regular rate and rhythm, no murmurs  rubs or gallops, no lower extremity edema.  Respiratory: Clear to auscultation bilaterally. Not using accessory muscles, speaking in full sentences.  Impression and Recommendations:    Hypertriglyceridemia Rechecking routine labs. Historically has been noncompliant with vaccine for, triglycerides have been over 600 in the past.  History of migraine headaches 3 migraines per month that each lasted a day. FMLA paperwork filled out for his headaches. Adding low-dose Topamax, as well as Maxalt for migraine abortion.  ERECTILE DYSFUNCTION Rechecking testosterone, refilling Cialis ___________________________________________ [name]. [name], M.D., ABFM., CAQSM. Primary Care and Sports Medicine [facility]  Adjunct Professor of Family Medicine    [employer]

## 2018-08-31 ENCOUNTER — Telehealth: Payer: Self-pay

## 2018-08-31 DIAGNOSIS — Z8669 Personal history of other diseases of the nervous system and sense organs: Secondary | ICD-10-CM

## 2018-08-31 MED ORDER — TOPIRAMATE 50 MG PO TABS: ORAL_TABLET | ORAL | 3 refills | Status: DC

## 2018-08-31 NOTE — Telephone Encounter (Signed)
Yes it was supposed to be 30, signed off on the new order.

## 2018-08-31 NOTE — Telephone Encounter (Signed)
Pharmacy called and states the prescription for the topiramate was sent in for 3 tablets. Was this by mistake?

## 2018-10-28 ENCOUNTER — Ambulatory Visit: Payer: PRIVATE HEALTH INSURANCE | Admitting: Sports Medicine

## 2018-11-15 ENCOUNTER — Other Ambulatory Visit: Payer: Self-pay | Admitting: Sports Medicine

## 2018-11-15 DIAGNOSIS — F5101 Primary insomnia: Secondary | ICD-10-CM

## 2018-11-15 MED ORDER — ZALEPLON 10 MG PO CAPS: 10.0000 mg | ORAL_CAPSULE | Freq: Every day | ORAL | 0 refills | Status: DC

## 2018-11-15 MED ORDER — PROMETHAZINE HCL 25 MG PO TABS: ORAL_TABLET | ORAL | 0 refills | Status: DC

## 2018-11-21 ENCOUNTER — Telehealth: Payer: Self-pay | Admitting: Sports Medicine

## 2018-11-21 DIAGNOSIS — F5101 Primary insomnia: Secondary | ICD-10-CM

## 2018-11-21 MED ORDER — ZOLPIDEM TARTRATE 10 MG PO TABS: 10.0000 mg | ORAL_TABLET | Freq: Every evening | ORAL | 3 refills | Status: DC | PRN

## 2018-11-21 NOTE — Telephone Encounter (Signed)
Switching back to Ambien 

## 2018-11-21 NOTE — Assessment & Plan Note (Signed)
Switching back to Ambien

## 2018-11-21 NOTE — Telephone Encounter (Signed)
I received a PA for his Zapelon and its not a covered medication on his insurance plan anymore. The preferred medications on this plan are Zolpedim and Temazepam. Please advise.

## 2018-11-22 NOTE — Telephone Encounter (Signed)
Received a fax from Pharmacy that Ambien needs PA. I have sent all the information to insurance and waiting on a response.

## 2018-12-01 ENCOUNTER — Other Ambulatory Visit: Payer: Self-pay | Admitting: Sports Medicine

## 2018-12-01 ENCOUNTER — Other Ambulatory Visit: Payer: Self-pay | Admitting: *Deleted

## 2018-12-01 ENCOUNTER — Encounter: Payer: Self-pay | Admitting: Sports Medicine

## 2018-12-01 DIAGNOSIS — F411 Generalized anxiety disorder: Secondary | ICD-10-CM

## 2018-12-01 MED ORDER — ALPRAZOLAM 1 MG PO TABS: 1.0000 mg | ORAL_TABLET | Freq: Every day | ORAL | 0 refills | Status: DC

## 2018-12-01 NOTE — Telephone Encounter (Signed)
I signed it twice, it had seemed like it didn't work the first time so I did it again, maybe it went twice today.  He can have it.

## 2018-12-01 NOTE — Telephone Encounter (Signed)
Verbal given to pharmacy to fill today.

## 2018-12-05 NOTE — Telephone Encounter (Signed)
Received a fax from Medicaid that additional informaitn was needed and waiting on a response.

## 2018-12-14 NOTE — Telephone Encounter (Signed)
I called and spoke with Thayer Ohm at [facility] and they have received an approval and patient is able to pick up the Ambien.

## 2018-12-16 NOTE — Telephone Encounter (Signed)
FINISHED

## 2019-03-14 ENCOUNTER — Other Ambulatory Visit: Payer: Self-pay | Admitting: Sports Medicine

## 2019-05-10 ENCOUNTER — Other Ambulatory Visit: Payer: Self-pay | Admitting: *Deleted

## 2019-05-10 DIAGNOSIS — F411 Generalized anxiety disorder: Secondary | ICD-10-CM

## 2019-05-10 MED ORDER — PROMETHAZINE HCL 25 MG PO TABS: ORAL_TABLET | ORAL | 0 refills | Status: DC

## 2019-05-10 MED ORDER — ALPRAZOLAM 1 MG PO TABS: 1.0000 mg | ORAL_TABLET | Freq: Every day | ORAL | 0 refills | Status: DC

## 2019-07-17 ENCOUNTER — Other Ambulatory Visit: Payer: Self-pay | Admitting: *Deleted

## 2019-07-17 DIAGNOSIS — F5101 Primary insomnia: Secondary | ICD-10-CM

## 2019-07-17 MED ORDER — PROMETHAZINE HCL 25 MG PO TABS: ORAL_TABLET | ORAL | 1 refills | Status: DC

## 2019-07-17 MED ORDER — ZOLPIDEM TARTRATE 10 MG PO TABS: 10.0000 mg | ORAL_TABLET | Freq: Every evening | ORAL | 3 refills | Status: DC | PRN

## 2019-08-30 ENCOUNTER — Other Ambulatory Visit: Payer: Self-pay

## 2019-08-30 DIAGNOSIS — F411 Generalized anxiety disorder: Secondary | ICD-10-CM

## 2019-08-30 MED ORDER — ALPRAZOLAM 1 MG PO TABS: 1.0000 mg | ORAL_TABLET | Freq: Every day | ORAL | 0 refills | Status: DC

## 2019-08-30 NOTE — Telephone Encounter (Signed)
Pt is requesting a refill of alprazolam, but is requesting a #90 day supply. Rx tee's up and ready for review.

## 2019-12-01 ENCOUNTER — Other Ambulatory Visit: Payer: Self-pay | Admitting: Sports Medicine

## 2019-12-01 ENCOUNTER — Other Ambulatory Visit: Payer: Self-pay | Admitting: *Deleted

## 2019-12-01 DIAGNOSIS — F411 Generalized anxiety disorder: Secondary | ICD-10-CM

## 2019-12-01 DIAGNOSIS — I10 Essential (primary) hypertension: Secondary | ICD-10-CM

## 2019-12-04 ENCOUNTER — Other Ambulatory Visit: Payer: Self-pay | Admitting: Sports Medicine

## 2019-12-05 ENCOUNTER — Other Ambulatory Visit: Payer: Self-pay | Admitting: *Deleted

## 2019-12-05 DIAGNOSIS — I10 Essential (primary) hypertension: Secondary | ICD-10-CM

## 2019-12-05 DIAGNOSIS — F411 Generalized anxiety disorder: Secondary | ICD-10-CM

## 2019-12-05 MED ORDER — ALPRAZOLAM 1 MG PO TABS: 1.0000 mg | ORAL_TABLET | Freq: Every day | ORAL | 0 refills | Status: DC

## 2019-12-05 MED ORDER — AMLODIPINE BESYLATE 5 MG PO TABS: ORAL_TABLET | ORAL | 0 refills | Status: DC

## 2019-12-05 MED ORDER — OLMESARTAN MEDOXOMIL-HCTZ 40-25 MG PO TABS: 1.0000 | ORAL_TABLET | Freq: Every day | ORAL | 0 refills | Status: DC

## 2021-05-22 ENCOUNTER — Emergency Department (HOSPITAL_BASED_OUTPATIENT_CLINIC_OR_DEPARTMENT_OTHER): Payer: 59

## 2021-05-22 ENCOUNTER — Emergency Department (HOSPITAL_BASED_OUTPATIENT_CLINIC_OR_DEPARTMENT_OTHER)
Admission: EM | Admit: 2021-05-22 | Discharge: 2021-05-22 | Disposition: A | Discharge status: Home / Self Care | Payer: 59 | Attending: Emergency Medicine | Admitting: Emergency Medicine

## 2021-05-22 ENCOUNTER — Encounter (HOSPITAL_BASED_OUTPATIENT_CLINIC_OR_DEPARTMENT_OTHER): Payer: Self-pay | Admitting: *Deleted

## 2021-05-22 ENCOUNTER — Other Ambulatory Visit: Payer: Self-pay

## 2021-05-22 DIAGNOSIS — J4520 Mild intermittent asthma, uncomplicated: Secondary | ICD-10-CM | POA: Insufficient documentation

## 2021-05-22 DIAGNOSIS — S92344A Nondisplaced fracture of fourth metatarsal bone, right foot, initial encounter for closed fracture: Secondary | ICD-10-CM | POA: Diagnosis not present

## 2021-05-22 DIAGNOSIS — M79671 Pain in right foot: Secondary | ICD-10-CM | POA: Insufficient documentation

## 2021-05-22 DIAGNOSIS — S99921A Unspecified injury of right foot, initial encounter: Secondary | ICD-10-CM | POA: Diagnosis present

## 2021-05-22 DIAGNOSIS — I10 Essential (primary) hypertension: Secondary | ICD-10-CM | POA: Diagnosis not present

## 2021-05-22 DIAGNOSIS — S0012XA Contusion of left eyelid and periocular area, initial encounter: Secondary | ICD-10-CM | POA: Insufficient documentation

## 2021-05-22 DIAGNOSIS — Z79899 Other long term (current) drug therapy: Secondary | ICD-10-CM | POA: Insufficient documentation

## 2021-05-22 DIAGNOSIS — W19XXXA Unspecified fall, initial encounter: Secondary | ICD-10-CM | POA: Insufficient documentation

## 2021-05-22 DIAGNOSIS — Y92007 Garden or yard of unspecified non-institutional (private) residence as the place of occurrence of the external cause: Secondary | ICD-10-CM | POA: Insufficient documentation

## 2021-05-22 DIAGNOSIS — F17210 Nicotine dependence, cigarettes, uncomplicated: Secondary | ICD-10-CM | POA: Diagnosis not present

## 2021-05-22 MED ORDER — OXYCODONE-ACETAMINOPHEN 5-325 MG PO TABS
1.0000 | ORAL_TABLET | Freq: Once | ORAL | Status: AC
Start: 2021-05-22 — End: 2021-05-22
  Administered 2021-05-22: 1 via ORAL
  Filled 2021-05-22: qty 1

## 2021-05-22 MED ORDER — OXYCODONE-ACETAMINOPHEN 5-325 MG PO TABS: 1.0000 | ORAL_TABLET | Freq: Four times a day (QID) | ORAL | 0 refills | Status: DC | PRN

## 2021-05-22 NOTE — ED Provider Notes (Signed)
[facility] EMERGENCY DEPARTMENT Provider Note   CSN: [id number] Arrival date & time: 05/22/21  1749     History Chief Complaint  Patient presents with   Marletta Lor    Philip TUMMINELLO is a 40 y.o. male With a past medical history of hypertension, anxiety, asthma, who presents today for evaluation of pain in his right foot. He states that yesterday while he was doing basic full maintenance outside he stepped into a foot and fell causing his foot to bend.  He has had severe difficulty bearing weight since due to pain.  He denies any numbness or tingling.  He denies any head injury.  He denies any injuries other than his right leg. He does not take any blood thinning medications.   HPI     Past Medical History:  Diagnosis Date   Anxiety    Asthma    mild, intermittent   Depression    Hypertension    Knee injury     Patient Active Problem List   Diagnosis Date Noted   History of migraine headaches 08/29/2018   Venous stasis dermatitis of both lower extremities 12/22/2017   Morbid obesity (HCC) 04/30/2017   Obstructive sleep apnea 04/30/2017   Hypertriglyceridemia 03/03/2017   Chronic epigastric pain 03/02/2017   Left knee pain with intra-articular loose body 05/02/2012   Benign essential hypertension 05/07/2008   ERECTILE DYSFUNCTION 07/06/2007   ASTHMA, EXERCISE INDUCED 11/24/2006   Insomnia 06/04/2006   Anxiety state 06/01/2006    Past Surgical History:  Procedure Laterality Date   DENTAL SURGERY     PILONIDAL CYST EXCISION N/A 04/12/2015   Procedure: CYST EXCISION PILONIDAL SIMPLE;  Surgeon: Franky Macho, MD;  Location: [facility];  Service: General;  Laterality: N/A;   TONSILECTOMY, ADENOIDECTOMY, BILATERAL MYRINGOTOMY AND TUBES         Family History  Problem Relation Age of Onset   Hypertension Mother    Hyperlipidemia Mother    Depression Father        committed suicide    Social History   Tobacco Use   Smoking status: Some Days     Packs/day: 0.30    Years: 4.00    Pack years: 1.20    Types: Cigarettes    Last attempt to quit: 10/22/2005    Years since quitting: 15.5   Smokeless tobacco: Never  Substance Use Topics   Alcohol use: Yes    Comment: 2 drinks a day   Drug use: No    Home Medications Prior to Admission medications   Medication Sig Start Date End Date Taking? Authorizing Provider  oxyCODONE-acetaminophen (PERCOCET/ROXICET) 5-325 MG tablet Take 1 tablet by mouth every 6 (six) hours as needed for severe pain or moderate pain. 05/22/21  Yes Cristina Gong, PA-C  albuterol (VENTOLIN HFA) 108 (90 Base) MCG/ACT inhaler Inhale 2 puffs into the lungs every 6 (six) hours as needed. 08/29/18   Monica Becton, MD  ALPRAZolam Prudy Feeler) 1 MG tablet Take 1 tablet (1 mg total) by mouth daily. 12/05/19   Monica Becton, MD  amLODipine (NORVASC) 5 MG tablet TAKE 1 TABLET(5 MG) BY MOUTH DAILY. Must see PCP in office for future refills. 12/05/19   Monica Becton, MD  Icosapent Ethyl 1 g CAPS Take 1 capsule (1 g total) by mouth 2 (two) times daily. 08/29/18   Monica Becton, MD  olmesartan-hydrochlorothiazide (BENICAR HCT) 40-25 MG tablet Take 1 tablet by mouth daily. Must make appointment with PCP for future refills.  12/05/19   Monica Becton, MD  promethazine (PHENERGAN) 25 MG tablet TAKE 1 TABLET(25 MG) BY MOUTH EVERY 8 HOURS AS NEEDED FOR NAUSEA OR VOMITING 07/17/19   Monica Becton, MD  rizatriptan (MAXALT-MLT) 5 MG disintegrating tablet Take 1 tablet (5 mg total) by mouth as needed for migraine. May repeat in 2 hours if needed 08/29/18   Monica Becton, MD  tadalafil (CIALIS) 20 MG tablet Take 1 tablet (20 mg total) by mouth daily as needed. 08/29/18   Monica Becton, MD  topiramate (TOPAMAX) 50 MG tablet One half tab by mouth qHS for one week then one tab by mouth daily. 08/31/18   Monica Becton, MD  zolpidem (AMBIEN) 10 MG tablet Take 1 tablet (10 mg total) by  mouth at bedtime as needed for sleep. 07/17/19   Monica Becton, MD    Allergies    Patient has no known allergies.  Review of Systems   Review of Systems  Constitutional:  Negative for chills and fever.  HENT:  Negative for congestion.   Respiratory:  Negative for chest tightness and shortness of breath.   Cardiovascular:  Negative for chest pain.  Gastrointestinal:  Negative for abdominal pain.  Musculoskeletal:        In, swelling, and bruising of the right foot.  Neurological:  Negative for weakness and headaches.  All other systems reviewed and are negative.  Physical Exam Updated Vital Signs BP (!) 139/109 (BP Location: Right Arm)   Pulse 97   Temp 97.9 F (36.6 C) (Oral)   Resp 16   Ht 5\' 11"  (1.803 m)   Wt 117.9 kg   SpO2 99%   BMI 36.26 kg/m   Physical Exam Vitals and nursing note reviewed.  Constitutional:      General: He is not in acute distress.    Appearance: He is not diaphoretic.  HENT:     Head: Normocephalic and atraumatic.  Eyes:     General: No scleral icterus.       Right eye: No discharge.        Left eye: No discharge.     Conjunctiva/sclera: Conjunctivae normal.  Cardiovascular:     Rate and Rhythm: Normal rate and regular rhythm.     Comments: Capillary refill to toes on right foot is under 2 seconds.  Right foot is warm and well-perfused. Pulmonary:     Effort: Pulmonary effort is normal. No respiratory distress.     Breath sounds: No stridor.  Abdominal:     General: There is no distension.  Musculoskeletal:     Cervical back: Normal range of motion.     Comments: Significant swelling on the right foot, and around the right ankle.  There is no proximal right lower leg tenderness to palpation.  Compartments in right leg are soft and easily compressible.  Skin:    General: Skin is warm and dry.     Comments: Skin is intact over the right foot and ankle.  There is significant amount of contusion around the right foot.   Neurological:     Mental Status: He is alert.     Sensory: No sensory deficit (Station intact to light touch to right foot.).     Motor: No abnormal muscle tone.  Psychiatric:        Mood and Affect: Mood normal.        Behavior: Behavior normal.    ED Results / Procedures / Treatments   Labs (all  labs ordered are listed, but only abnormal results are displayed) Labs Reviewed - No data to display  EKG None  Radiology DG Ankle Complete Right  Result Date: 05/22/2021 CLINICAL DATA:  Larey Seat yesterday, bruising and swelling EXAM: RIGHT ANKLE - COMPLETE 3+ VIEW COMPARISON:  None. FINDINGS: Frontal, oblique, and lateral views of the right ankle are obtained. No fracture about the right ankle. Joint spaces are well preserved. There is severe anterior and lateral soft tissue swelling. Within the visualized portions of the hindfoot and midfoot, there is significant soft tissue swelling. Possible cortical irregularity at the base of the third metatarsal. Recommend dedicated right foot series. IMPRESSION: 1. Diffuse soft tissue swelling of the visualized foot and ankle. 2. Possible fracture at the base of the third metatarsal, incompletely evaluated on this exam. Dedicated right foot series recommended. 3. Otherwise unremarkable right ankle. Electronically Signed   By: Sharlet Salina M.D.   On: 05/22/2021 18:51   DG Foot Complete Right  Result Date: 05/22/2021 CLINICAL DATA:  Larey Seat 1 day ago. Persistent foot pain and swelling. EXAM: RIGHT FOOT COMPLETE - 3+ VIEW COMPARISON:  None. FINDINGS: Comminuted fractures involving the bases of the third and fourth metatarsals. The fourth metatarsal fracture has an intra-articular component. Suspect small avulsion fracture involving the medial cuneiform medially at the first tarsal metatarsal joint. Also, the space between the medial cuneiform and the second metatarsal base is slightly widened. Could not exclude a Lisfranc ligament injury. Possible nondisplaced  fracture involving the base of the second metatarsal. Other small fractures are possible. Recommend CT for further evaluation. IMPRESSION: Midfoot fractures as above. Suspect Lisfranc injury. Recommend CT for further evaluation. Electronically Signed   By: Rudie Meyer M.D.   On: 05/22/2021 19:56    Procedures Procedures   Medications Ordered in ED Medications  oxyCODONE-acetaminophen (PERCOCET/ROXICET) 5-325 MG per tablet 1 tablet (1 tablet Oral Given 05/22/21 2028)    ED Course  I have reviewed the triage vital signs and the nursing notes.  Pertinent labs & imaging results that were available during my care of the patient were reviewed by me and considered in my medical decision making (see chart for details).  Clinical Course as of 05/23/21 0102  Thu May 22, 2021  2033 [place]au [EH]    Clinical Course User Index [EH] Norman Clay   MDM Rules/Calculators/A&P                          Patient is a healthy 40 year old man who presents today for evaluation after he fell in a hole yesterday outside injuring his right foot and ankle.  X-rays of the right ankle were initially obtained without evidence of ankle fracture however showed concern for right foot fracture.  Right foot x-rays were obtained showing concern for multiple midfoot fractures of the third and fourth metatarsals with comminution and intra-articular extension, a possible medial cuneiform fracture and concern for a Lisfranc injury.  Patient is neurovascularly intact on exam. I spoke with Dr. Dion Saucier, on-call for orthopedics.  He states that he will be able to order the recommended CT scan follow-up as an outpatient.  He is in agreement with plan for strict nonweightbearing, crutches, pain meds, Ace wrap and walking boot.  I stressed to patient the importance of elevation, ice, and compression and reducing his overall swelling.  Patient is aware that there is a chance that he may require surgery.  We discussed the  importance of skin care  to avoid skin breakdown in the setting of a fracture, this appears to be closed at this time. [facility] [place] PMP is consulted, he is given a prescription for at home narcotic pain medicine and given a dose here in the ER.  He is instructed not to drive, operate heavy machinery, or perform other potentially dangerous tasks while taking this medications.  Return precautions were discussed with patient who states their understanding.  At the time of discharge patient denied any unaddressed complaints or concerns.  Patient is agreeable for discharge home.  Note: Portions of this report may have been transcribed using voice recognition software. Every effort was made to ensure accuracy; however, inadvertent computerized transcription errors may be present   Final Clinical Impression(s) / ED Diagnoses Final diagnoses:  Fall, initial encounter  Closed nondisplaced fracture of fourth metatarsal bone of right foot, initial encounter    Rx / DC Orders ED Discharge Orders          Ordered    oxyCODONE-acetaminophen (PERCOCET/ROXICET) 5-325 MG tablet  Every 6 hours PRN        05/22/21 2040             Cristina Gong, PA-C 05/23/21 0105    Rolan Bucco, MD 05/23/21 1537

## 2021-05-22 NOTE — Discharge Instructions (Addendum)
Please call Dr. Shelba Flake office tomorrow to set up an appointment on Monday or Wednesday of this coming week.  Please make sure you are keeping your foot above your heart whenever possible.  It is very important that you do not put any weight on your foot.  Please use your crutches at all times.  You may very carefully take the boot and Ace wrap off to shower.  Please make sure you fully dry your foot and ankle gently to avoid any skin breaks or tears before reapplying Ace wrap and boot.  You can take the boot off to ice.  While in the ED your blood pressure was high.  Please follow up with your primary care doctor or the wellness clinic for repeat evaluation as you may need medication.  High blood pressure can cause long term, potentially serious, damage if left untreated.   Today you received medications that may make you sleepy or impair your ability to make decisions.  For the next 24 hours please do not drive, operate heavy machinery, care for a small child with out another adult present, or perform any activities that may cause harm to you or someone else if you were to fall asleep or be impaired.   You are being prescribed a medication which may make you sleepy. Please follow up of listed precautions for at least 24 hours after taking one dose.  Please take Ibuprofen (Advil, motrin) and Tylenol (acetaminophen) to relieve your pain.    You may take up to 600 MG (3 pills) of normal strength ibuprofen every 8 hours as needed.   You make take tylenol, up to 1,000 mg (two extra strength pills) every 8 hours as needed.   It is safe to take ibuprofen and tylenol at the same time as they work differently.   Do not take more than 3,000 mg tylenol in a 24 hour period (not more than one dose every 8 hours.  Please check all medication labels as many medications such as pain and cold medications may contain tylenol.  Do not drink alcohol while taking these medications.  Do not take other NSAID'S while  taking ibuprofen (such as aleve or naproxen).  Please take ibuprofen with food to decrease stomach upset.      FINDINGS:  Comminuted fractures involving the bases of the third and fourth  metatarsals. The fourth metatarsal fracture has an intra-articular  component. Suspect small avulsion fracture involving the medial  cuneiform medially at the first tarsal metatarsal joint. Also, the  space between the medial cuneiform and the second metatarsal base is  slightly widened. Could not exclude a Lisfranc ligament injury.  Possible nondisplaced fracture involving the base of the second  metatarsal. Other small fractures are possible. Recommend CT for  further evaluation.

## 2021-05-22 NOTE — ED Triage Notes (Signed)
C/o fall in yard x 1 day ago , c/o right ankle injury , bruising noted around left eye

## 2021-05-23 ENCOUNTER — Other Ambulatory Visit: Payer: Self-pay | Admitting: Sports Medicine

## 2021-05-23 DIAGNOSIS — M79671 Pain in right foot: Secondary | ICD-10-CM

## 2021-06-01 ENCOUNTER — Ambulatory Visit
Admission: RE | Admit: 2021-06-01 | Discharge: 2021-06-01 | Disposition: A | Discharge status: Home / Self Care | Payer: 59 | Source: Ambulatory Visit | Attending: Sports Medicine | Admitting: Sports Medicine

## 2021-06-01 DIAGNOSIS — M79671 Pain in right foot: Secondary | ICD-10-CM

## 2021-06-17 ENCOUNTER — Ambulatory Visit (INDEPENDENT_AMBULATORY_CARE_PROVIDER_SITE_OTHER): Payer: 59 | Admitting: Sports Medicine

## 2021-06-17 DIAGNOSIS — F5101 Primary insomnia: Secondary | ICD-10-CM

## 2021-06-17 DIAGNOSIS — I10 Essential (primary) hypertension: Secondary | ICD-10-CM | POA: Diagnosis not present

## 2021-06-17 DIAGNOSIS — E291 Testicular hypofunction: Secondary | ICD-10-CM

## 2021-06-17 DIAGNOSIS — F411 Generalized anxiety disorder: Secondary | ICD-10-CM | POA: Diagnosis not present

## 2021-06-17 DIAGNOSIS — E781 Pure hyperglyceridemia: Secondary | ICD-10-CM

## 2021-06-17 DIAGNOSIS — G4733 Obstructive sleep apnea (adult) (pediatric): Secondary | ICD-10-CM

## 2021-06-17 DIAGNOSIS — N529 Male erectile dysfunction, unspecified: Secondary | ICD-10-CM | POA: Diagnosis not present

## 2021-06-17 MED ORDER — OLMESARTAN MEDOXOMIL-HCTZ 40-25 MG PO TABS: 1.0000 | ORAL_TABLET | Freq: Every day | ORAL | 3 refills | Status: DC

## 2021-06-17 MED ORDER — TADALAFIL 5 MG PO TABS: 5.0000 mg | ORAL_TABLET | Freq: Every day | ORAL | 11 refills | Status: DC

## 2021-06-17 MED ORDER — ALPRAZOLAM 1 MG PO TABS: 1.0000 mg | ORAL_TABLET | Freq: Every day | ORAL | 0 refills | Status: DC

## 2021-06-17 MED ORDER — AMLODIPINE BESYLATE 5 MG PO TABS: 5.0000 mg | ORAL_TABLET | Freq: Every day | ORAL | 3 refills | Status: DC

## 2021-06-17 MED ORDER — ALBUTEROL SULFATE HFA 108 (90 BASE) MCG/ACT IN AERS: 2.0000 | INHALATION_SPRAY | Freq: Four times a day (QID) | RESPIRATORY_TRACT | 11 refills | Status: DC | PRN

## 2021-06-17 MED ORDER — ZOLPIDEM TARTRATE 10 MG PO TABS: 10.0000 mg | ORAL_TABLET | Freq: Every evening | ORAL | 0 refills | Status: DC | PRN

## 2021-06-17 MED ORDER — BUSPIRONE HCL 5 MG PO TABS: 5.0000 mg | ORAL_TABLET | Freq: Three times a day (TID) | ORAL | 3 refills | Status: DC

## 2021-06-17 NOTE — Assessment & Plan Note (Signed)
Insomnia is multifactorial, I think we can continue his zolpidem for now. There is likely an element of sleep apnea, we will do a lab work-up for further evaluation of his fatigue, I think uncontrolled anxiety is also at play here. Further investigation and management after labs.

## 2021-06-17 NOTE — Assessment & Plan Note (Signed)
He was noted to have moderate sleep apnea with an apnea-hypopnea index of 29 in 2019. Has not been using any CPAP. We can revisit this do I think he really needs to get back on CPAP.

## 2021-06-17 NOTE — Progress Notes (Addendum)
    Procedures performed today:    None.  Independent interpretation of notes and tests performed by another provider:   None.  Brief History, Exam, Impression, and Recommendations:    Anxiety state Moderate amount of anxiety, he has been hesitant to do controller medication, has not done well with SSRIs so we will add BuSpar 5 3 times daily with alprazolam for use only as needed. Return to see me in a month, we will titrate BuSpar dose as needed.  Benign essential hypertension Continue medications, checking labs today.  Erectile dysfunction Refilling Cialis 5 mg daily to [facility], he will use good Rx.  Insomnia Insomnia is multifactorial, I think we can continue his zolpidem for now. There is likely an element of sleep apnea, we will do a lab work-up for further evaluation of his fatigue, I think uncontrolled anxiety is also at play here. Further investigation and management after labs.  Obstructive sleep apnea He was noted to have moderate sleep apnea with an apnea-hypopnea index of 29 in 2019. Has not been using any CPAP. We can revisit this do I think he really needs to get back on CPAP.  Hypertriglyceridemia Markedly elevated, adding fenofibrate, we will add Vascepa if insufficient improvement after 3 months.  Male hypogonadism 40 year old male with fatigue, anxiety, testosterone noted very low at 34, awaiting patient's request for modality of supplementation    ___________________________________________ Philip Cuevas. [name], M.D., ABFM., CAQSM. Primary Care and Sports Medicine [facility]  Adjunct Instructor of Family Medicine  [employer]

## 2021-06-17 NOTE — Assessment & Plan Note (Signed)
Continue medications, checking labs today.

## 2021-06-17 NOTE — Assessment & Plan Note (Signed)
Refilling Cialis 5 mg daily to [facility], he will use good Rx.

## 2021-06-17 NOTE — Assessment & Plan Note (Signed)
Moderate amount of anxiety, he has been hesitant to do controller medication, has not done well with SSRIs so we will add BuSpar 5 3 times daily with alprazolam for use only as needed. Return to see me in a month, we will titrate BuSpar dose as needed.

## 2021-06-18 MED ORDER — FENOFIBRATE 160 MG PO TABS: 160.0000 mg | ORAL_TABLET | Freq: Every day | ORAL | 3 refills | Status: DC

## 2021-06-18 NOTE — Addendum Note (Signed)
Addended by: Monica Becton on: 06/18/2021 09:38 AM   Modules accepted: Orders

## 2021-06-18 NOTE — Assessment & Plan Note (Signed)
Markedly elevated, adding fenofibrate, we will add Vascepa if insufficient improvement after 3 months.

## 2021-06-21 LAB — LIPID PANEL
Cholesterol: 228 mg/dL — ABNORMAL HIGH (ref <200)
HDL: 31 mg/dL — ABNORMAL LOW (ref >=40)
Non-HDL Cholesterol (Calc): 197 mg/dL (calc) — ABNORMAL HIGH (ref <130)
Total CHOL/HDL Ratio: 7.4 (calc) — ABNORMAL HIGH (ref <5.0)
Triglycerides: 483 mg/dL — ABNORMAL HIGH (ref <150)

## 2021-06-21 LAB — CBC
HCT: 43.7 % (ref 38.5–50.0)
Hemoglobin: 15.6 g/dL (ref 13.2–17.1)
MCH: 31.8 pg (ref 27.0–33.0)
MCHC: 35.7 g/dL (ref 32.0–36.0)
MCV: 89 fL (ref 80.0–100.0)
MPV: 9.4 fL (ref 7.5–12.5)
Platelets: 252 10*3/uL (ref 140–400)
RBC: 4.91 10*6/uL (ref 4.20–5.80)
RDW: 12.3 % (ref 11.0–15.0)
WBC: 5.9 10*3/uL (ref 3.8–10.8)

## 2021-06-21 LAB — HEMOGLOBIN A1C
Hgb A1c MFr Bld: 5.2 % of total Hgb (ref <5.7)
Mean Plasma Glucose: 103 mg/dL
eAG (mmol/L): 5.7 mmol/L

## 2021-06-21 LAB — COMPREHENSIVE METABOLIC PANEL
AG Ratio: 1.7 (calc) (ref 1.0–2.5)
ALT: 33 U/L (ref 9–46)
AST: 21 U/L (ref 10–40)
Albumin: 4.3 g/dL (ref 3.6–5.1)
Alkaline phosphatase (APISO): 92 U/L (ref 36–130)
BUN: 23 mg/dL (ref 7–25)
CO2: 25 mmol/L (ref 20–32)
Calcium: 9.5 mg/dL (ref 8.6–10.3)
Chloride: 99 mmol/L (ref 98–110)
Creat: 0.82 mg/dL (ref 0.60–1.26)
Globulin: 2.5 g/dL (calc) (ref 1.9–3.7)
Glucose, Bld: 100 mg/dL — ABNORMAL HIGH (ref 65–99)
Potassium: 4.3 mmol/L (ref 3.5–5.3)
Sodium: 139 mmol/L (ref 135–146)
Total Bilirubin: 0.3 mg/dL (ref 0.2–1.2)
Total Protein: 6.8 g/dL (ref 6.1–8.1)

## 2021-06-21 LAB — TESTOSTERONE, FREE & TOTAL
Free Testosterone: 13.3 pg/mL — ABNORMAL LOW (ref 35.0–155.0)
Testosterone, Total, LC-MS-MS: 75 ng/dL — ABNORMAL LOW (ref 250–1100)

## 2021-06-21 LAB — TSH: TSH: 1.89 mIU/L (ref 0.40–4.50)

## 2021-06-22 ENCOUNTER — Encounter (INDEPENDENT_AMBULATORY_CARE_PROVIDER_SITE_OTHER): Payer: 59

## 2021-06-22 DIAGNOSIS — E291 Testicular hypofunction: Secondary | ICD-10-CM | POA: Diagnosis not present

## 2021-06-22 NOTE — Assessment & Plan Note (Signed)
40 year old male with fatigue, anxiety, testosterone noted very low at 75, awaiting patient's request for modality of supplementation

## 2021-06-23 MED ORDER — TESTOSTERONE CYPIONATE 200 MG/ML IM SOLN: 200.0000 mg | INTRAMUSCULAR | 1 refills | Status: DC

## 2021-06-23 MED ORDER — "SYRINGE 18G X 1-1/2"" 3 ML MISC": 0 refills | Status: DC

## 2021-06-23 MED ORDER — "NEEDLE (DISP) 22G X 1-1/2"" MISC": 0 refills | Status: DC

## 2021-06-23 NOTE — Assessment & Plan Note (Signed)
"  I will send to your pharmacy, I will also add the syringes, needles to draw the medication, as well as the 22-gauge needles to inject, be sure to clean the area, it will need to be into a large muscle, not into the subcutaneous tissues.  After 2 injections 2 weeks apart you will need to get your testosterone levels rechecked at the 1 week point, i.e. exactly in between injections and we can dose titrate at that point.  Orders placed for everything."

## 2021-06-23 NOTE — Telephone Encounter (Signed)
I spent 5 total minutes of online digital evaluation and management services. 

## 2021-07-09 ENCOUNTER — Other Ambulatory Visit: Payer: Self-pay | Admitting: Sports Medicine

## 2021-07-09 DIAGNOSIS — F411 Generalized anxiety disorder: Secondary | ICD-10-CM

## 2021-07-28 ENCOUNTER — Other Ambulatory Visit: Payer: Self-pay | Admitting: Sports Medicine

## 2021-07-28 DIAGNOSIS — F411 Generalized anxiety disorder: Secondary | ICD-10-CM

## 2021-09-08 DIAGNOSIS — E781 Pure hyperglyceridemia: Secondary | ICD-10-CM

## 2021-09-08 NOTE — Telephone Encounter (Signed)
-----   Message from Carolin Coy, [place] sent at 09/08/2021 10:37 AM EST ----- Regarding: FW: rechecking fasting lipids Can you please add the orders for labs?  ----- Message ----- From: Carolin Coy, CMA Sent: 09/08/2021  12:00 AM EST To: Carolin Coy, CMA Subject: rechecking fasting lipids                      Starting fenofibrate, recheck fasting lipids in 3 months.

## 2021-09-08 NOTE — Assessment & Plan Note (Signed)
Started fenofibrate 3 months ago, rechecking lipids, if insufficient improvement we will add Vascepa as well.

## 2022-07-06 ENCOUNTER — Observation Stay (HOSPITAL_COMMUNITY): Payer: Medicaid Other

## 2022-07-06 ENCOUNTER — Encounter (HOSPITAL_COMMUNITY): Payer: Self-pay

## 2022-07-06 ENCOUNTER — Inpatient Hospital Stay (HOSPITAL_COMMUNITY)
Admission: EM | Admit: 2022-07-06 | Discharge: 2022-07-12 | DRG: 897 | Disposition: A | Discharge status: Home / Self Care | Payer: Medicaid Other | Attending: Family Medicine | Admitting: Family Medicine

## 2022-07-06 ENCOUNTER — Emergency Department (HOSPITAL_COMMUNITY): Payer: Medicaid Other

## 2022-07-06 ENCOUNTER — Ambulatory Visit (HOSPITAL_COMMUNITY)
Admission: EM | Admit: 2022-07-06 | Discharge: 2022-07-06 | Disposition: A | Discharge status: Other Acute Inpatient Hospital | Payer: No Payment, Other | Attending: Psychiatry | Admitting: Psychiatry

## 2022-07-06 DIAGNOSIS — F10939 Alcohol use, unspecified with withdrawal, unspecified: Secondary | ICD-10-CM

## 2022-07-06 DIAGNOSIS — K760 Fatty (change of) liver, not elsewhere classified: Secondary | ICD-10-CM | POA: Diagnosis present

## 2022-07-06 DIAGNOSIS — F1123 Opioid dependence with withdrawal: Secondary | ICD-10-CM | POA: Diagnosis present

## 2022-07-06 DIAGNOSIS — R070 Pain in throat: Secondary | ICD-10-CM | POA: Diagnosis present

## 2022-07-06 DIAGNOSIS — F1010 Alcohol abuse, uncomplicated: Secondary | ICD-10-CM | POA: Insufficient documentation

## 2022-07-06 DIAGNOSIS — J45909 Unspecified asthma, uncomplicated: Secondary | ICD-10-CM | POA: Diagnosis present

## 2022-07-06 DIAGNOSIS — G4733 Obstructive sleep apnea (adult) (pediatric): Secondary | ICD-10-CM | POA: Diagnosis present

## 2022-07-06 DIAGNOSIS — F419 Anxiety disorder, unspecified: Secondary | ICD-10-CM | POA: Diagnosis present

## 2022-07-06 DIAGNOSIS — Z79899 Other long term (current) drug therapy: Secondary | ICD-10-CM

## 2022-07-06 DIAGNOSIS — F10931 Alcohol use, unspecified with withdrawal delirium: Secondary | ICD-10-CM

## 2022-07-06 DIAGNOSIS — F17210 Nicotine dependence, cigarettes, uncomplicated: Secondary | ICD-10-CM | POA: Diagnosis present

## 2022-07-06 DIAGNOSIS — E860 Dehydration: Secondary | ICD-10-CM | POA: Diagnosis present

## 2022-07-06 DIAGNOSIS — Z609 Problem related to social environment, unspecified: Secondary | ICD-10-CM

## 2022-07-06 DIAGNOSIS — Z8249 Family history of ischemic heart disease and other diseases of the circulatory system: Secondary | ICD-10-CM

## 2022-07-06 DIAGNOSIS — F1193 Opioid use, unspecified with withdrawal: Secondary | ICD-10-CM

## 2022-07-06 DIAGNOSIS — E781 Pure hyperglyceridemia: Secondary | ICD-10-CM | POA: Diagnosis present

## 2022-07-06 DIAGNOSIS — F10231 Alcohol dependence with withdrawal delirium: Principal | ICD-10-CM | POA: Diagnosis present

## 2022-07-06 DIAGNOSIS — E871 Hypo-osmolality and hyponatremia: Secondary | ICD-10-CM | POA: Diagnosis present

## 2022-07-06 DIAGNOSIS — R0789 Other chest pain: Secondary | ICD-10-CM | POA: Insufficient documentation

## 2022-07-06 DIAGNOSIS — Z818 Family history of other mental and behavioral disorders: Secondary | ICD-10-CM

## 2022-07-06 DIAGNOSIS — R112 Nausea with vomiting, unspecified: Secondary | ICD-10-CM | POA: Insufficient documentation

## 2022-07-06 DIAGNOSIS — F1190 Opioid use, unspecified, uncomplicated: Secondary | ICD-10-CM

## 2022-07-06 DIAGNOSIS — R000 Tachycardia, unspecified: Secondary | ICD-10-CM

## 2022-07-06 DIAGNOSIS — G4700 Insomnia, unspecified: Secondary | ICD-10-CM | POA: Diagnosis present

## 2022-07-06 DIAGNOSIS — E876 Hypokalemia: Secondary | ICD-10-CM | POA: Diagnosis present

## 2022-07-06 DIAGNOSIS — Z659 Problem related to unspecified psychosocial circumstances: Secondary | ICD-10-CM

## 2022-07-06 DIAGNOSIS — K7010 Alcoholic hepatitis without ascites: Secondary | ICD-10-CM | POA: Diagnosis present

## 2022-07-06 DIAGNOSIS — I10 Essential (primary) hypertension: Secondary | ICD-10-CM | POA: Diagnosis present

## 2022-07-06 DIAGNOSIS — F1910 Other psychoactive substance abuse, uncomplicated: Principal | ICD-10-CM

## 2022-07-06 DIAGNOSIS — Z91141 Patient's other noncompliance with medication regimen due to financial hardship: Secondary | ICD-10-CM

## 2022-07-06 DIAGNOSIS — E872 Acidosis, unspecified: Secondary | ICD-10-CM | POA: Diagnosis present

## 2022-07-06 DIAGNOSIS — Z6836 Body mass index (BMI) 36.0-36.9, adult: Secondary | ICD-10-CM

## 2022-07-06 DIAGNOSIS — Z597 Insufficient social insurance and welfare support: Secondary | ICD-10-CM

## 2022-07-06 HISTORY — DX: Opioid use, unspecified, uncomplicated: F11.90

## 2022-07-06 LAB — COMPREHENSIVE METABOLIC PANEL
ALT: 179 U/L — ABNORMAL HIGH (ref 0–44)
AST: 219 U/L — ABNORMAL HIGH (ref 15–41)
Albumin: 4.1 g/dL (ref 3.5–5.0)
Alkaline Phosphatase: 88 U/L (ref 38–126)
Anion gap: 25 — ABNORMAL HIGH (ref 5–15)
BUN: 8 mg/dL (ref 6–20)
CO2: 16 mmol/L — ABNORMAL LOW (ref 22–32)
Calcium: 9.3 mg/dL (ref 8.9–10.3)
Chloride: 87 mmol/L — ABNORMAL LOW (ref 98–111)
Creatinine, Ser: 1 mg/dL (ref 0.61–1.24)
GFR, Estimated: >60 mL/min (ref >60)
Glucose, Bld: 162 mg/dL — ABNORMAL HIGH (ref 70–99)
Potassium: 3.5 mmol/L (ref 3.5–5.1)
Sodium: 128 mmol/L — ABNORMAL LOW (ref 135–145)
Total Bilirubin: 2.8 mg/dL — ABNORMAL HIGH (ref 0.3–1.2)
Total Protein: 7.2 g/dL (ref 6.5–8.1)

## 2022-07-06 LAB — URINALYSIS, ROUTINE W REFLEX MICROSCOPIC
Bacteria, UA: NONE SEEN
Bilirubin Urine: NEGATIVE
Glucose, UA: NEGATIVE mg/dL
Hgb urine dipstick: NEGATIVE
Ketones, ur: 20 mg/dL — AB
Leukocytes,Ua: NEGATIVE
Nitrite: NEGATIVE
Protein, ur: 100 mg/dL — AB
Specific Gravity, Urine: 1.02 (ref 1.005–1.030)
pH: 6 (ref 5.0–8.0)

## 2022-07-06 LAB — I-STAT VENOUS BLOOD GAS, ED
Acid-base deficit: 5 mmol/L — ABNORMAL HIGH (ref 0.0–2.0)
Bicarbonate: 18.2 mmol/L — ABNORMAL LOW (ref 20.0–28.0)
Calcium, Ion: 1 mmol/L — ABNORMAL LOW (ref 1.15–1.40)
HCT: 49 % (ref 39.0–52.0)
Hemoglobin: 16.7 g/dL (ref 13.0–17.0)
O2 Saturation: 99 %
Potassium: 3.3 mmol/L — ABNORMAL LOW (ref 3.5–5.1)
Sodium: 123 mmol/L — ABNORMAL LOW (ref 135–145)
TCO2: 19 mmol/L — ABNORMAL LOW (ref 22–32)
pCO2, Ven: 28.5 mmHg — ABNORMAL LOW (ref 44–60)
pH, Ven: 7.414 (ref 7.25–7.43)
pO2, Ven: 153 mmHg — ABNORMAL HIGH (ref 32–45)

## 2022-07-06 LAB — CBC WITH DIFFERENTIAL/PLATELET
Abs Immature Granulocytes: 0.07 10*3/uL (ref 0.00–0.07)
Basophils Absolute: 0 10*3/uL (ref 0.0–0.1)
Basophils Relative: 0 %
Eosinophils Absolute: 0 10*3/uL (ref 0.0–0.5)
Eosinophils Relative: 0 %
HCT: 46 % (ref 39.0–52.0)
Hemoglobin: 17 g/dL (ref 13.0–17.0)
Immature Granulocytes: 1 %
Lymphocytes Relative: 4 %
Lymphs Abs: 0.4 10*3/uL — ABNORMAL LOW (ref 0.7–4.0)
MCH: 35.2 pg — ABNORMAL HIGH (ref 26.0–34.0)
MCHC: 37 g/dL — ABNORMAL HIGH (ref 30.0–36.0)
MCV: 95.2 fL (ref 80.0–100.0)
Monocytes Absolute: 0.6 10*3/uL (ref 0.1–1.0)
Monocytes Relative: 7 %
Neutro Abs: 8.1 10*3/uL — ABNORMAL HIGH (ref 1.7–7.7)
Neutrophils Relative %: 88 %
Platelets: 146 10*3/uL — ABNORMAL LOW (ref 150–400)
RBC: 4.83 MIL/uL (ref 4.22–5.81)
RDW: 13.2 % (ref 11.5–15.5)
WBC: 9.2 10*3/uL (ref 4.0–10.5)
nRBC: 1.1 % — ABNORMAL HIGH (ref 0.0–0.2)

## 2022-07-06 LAB — PROTIME-INR
INR: 1 (ref 0.8–1.2)
Prothrombin Time: 13 seconds (ref 11.4–15.2)

## 2022-07-06 LAB — RAPID URINE DRUG SCREEN, HOSP PERFORMED
Amphetamines: NOT DETECTED
Barbiturates: NOT DETECTED
Benzodiazepines: POSITIVE — AB
Cocaine: NOT DETECTED
Opiates: NOT DETECTED
Tetrahydrocannabinol: NOT DETECTED

## 2022-07-06 LAB — CK: Total CK: 330 U/L (ref 49–397)

## 2022-07-06 LAB — ETHANOL: Alcohol, Ethyl (B): <10 mg/dL (ref <10)

## 2022-07-06 MED ORDER — LORAZEPAM 2 MG/ML IJ SOLN: 0.0000 mg | INTRAMUSCULAR | Status: DC | PRN

## 2022-07-06 MED ORDER — LORAZEPAM 2 MG/ML IJ SOLN: 1.0000 mg | Freq: Once | INTRAMUSCULAR | Status: AC

## 2022-07-06 MED ORDER — PROCHLORPERAZINE EDISYLATE 10 MG/2ML IJ SOLN: 10.0000 mg | INTRAMUSCULAR | Status: DC | PRN

## 2022-07-06 MED ORDER — LORAZEPAM 1 MG PO TABS: 0.0000 mg | ORAL_TABLET | Freq: Two times a day (BID) | ORAL | Status: DC

## 2022-07-06 MED ORDER — THIAMINE HCL 100 MG/ML IJ SOLN
100.0000 mg | Freq: Every day | INTRAMUSCULAR | Status: DC
  Administered 2022-07-06 – 2022-07-07 (×2): 100 mg via INTRAVENOUS
  Filled 2022-07-06 (×2): qty 2

## 2022-07-06 MED ORDER — LOPERAMIDE HCL 2 MG PO CAPS
2.0000 mg | ORAL_CAPSULE | ORAL | Status: DC | PRN
  Filled 2022-07-06: qty 2

## 2022-07-06 MED ORDER — PROCHLORPERAZINE EDISYLATE 10 MG/2ML IJ SOLN: 5.0000 mg | INTRAMUSCULAR | Status: DC | PRN

## 2022-07-06 MED ORDER — CHLORDIAZEPOXIDE HCL 25 MG PO CAPS: 25.0000 mg | ORAL_CAPSULE | ORAL | Status: DC

## 2022-07-06 MED ORDER — ENOXAPARIN SODIUM 40 MG/0.4ML IJ SOSY
40.0000 mg | PREFILLED_SYRINGE | INTRAMUSCULAR | Status: DC
  Administered 2022-07-06: 40 mg via SUBCUTANEOUS
  Filled 2022-07-06: qty 0.4

## 2022-07-06 MED ORDER — LORAZEPAM 1 MG PO TABS: 0.0000 mg | ORAL_TABLET | ORAL | Status: DC

## 2022-07-06 MED ORDER — NICOTINE 14 MG/24HR TD PT24
14.0000 mg | MEDICATED_PATCH | Freq: Every day | TRANSDERMAL | Status: DC
  Administered 2022-07-07: 14 mg via TRANSDERMAL
  Filled 2022-07-06: qty 1

## 2022-07-06 MED ORDER — LORAZEPAM 2 MG/ML IJ SOLN
0.0000 mg | Freq: Four times a day (QID) | INTRAMUSCULAR | Status: DC
  Administered 2022-07-06: 2 mg via INTRAVENOUS
  Filled 2022-07-06: qty 1

## 2022-07-06 MED ORDER — SODIUM CHLORIDE 0.9 % IV SOLN: INTRAVENOUS | Status: DC

## 2022-07-06 MED ORDER — NICOTINE 21 MG/24HR TD PT24
21.0000 mg | MEDICATED_PATCH | Freq: Every day | TRANSDERMAL | Status: DC
  Administered 2022-07-06: 21 mg via TRANSDERMAL
  Filled 2022-07-06: qty 1

## 2022-07-06 MED ORDER — LORAZEPAM 1 MG PO TABS: 0.0000 mg | ORAL_TABLET | Freq: Four times a day (QID) | ORAL | Status: DC

## 2022-07-06 MED ORDER — CHLORDIAZEPOXIDE HCL 25 MG PO CAPS: 25.0000 mg | ORAL_CAPSULE | Freq: Every day | ORAL | Status: DC

## 2022-07-06 MED ORDER — CHLORDIAZEPOXIDE HCL 25 MG PO CAPS
50.0000 mg | ORAL_CAPSULE | Freq: Once | ORAL | Status: AC
  Administered 2022-07-06: 50 mg via ORAL
  Filled 2022-07-06: qty 2

## 2022-07-06 MED ORDER — LORAZEPAM 1 MG PO TABS: 1.0000 mg | ORAL_TABLET | Freq: Once | ORAL | Status: AC

## 2022-07-06 MED ORDER — ALBUTEROL SULFATE (2.5 MG/3ML) 0.083% IN NEBU: 3.0000 mL | INHALATION_SOLUTION | Freq: Four times a day (QID) | RESPIRATORY_TRACT | Status: DC | PRN

## 2022-07-06 MED ORDER — HYDROXYZINE HCL 25 MG PO TABS
25.0000 mg | ORAL_TABLET | Freq: Four times a day (QID) | ORAL | Status: AC | PRN
  Administered 2022-07-08 – 2022-07-09 (×3): 25 mg via ORAL
  Filled 2022-07-06 (×3): qty 1

## 2022-07-06 MED ORDER — LORAZEPAM 2 MG/ML IJ SOLN
1.0000 mg | Freq: Once | INTRAMUSCULAR | Status: AC
  Administered 2022-07-06: 1 mg via INTRAVENOUS
  Filled 2022-07-06: qty 1

## 2022-07-06 MED ORDER — ADULT MULTIVITAMIN W/MINERALS CH
1.0000 | ORAL_TABLET | Freq: Every day | ORAL | Status: DC
  Administered 2022-07-06 – 2022-07-12 (×7): 1 via ORAL
  Filled 2022-07-06 (×7): qty 1

## 2022-07-06 MED ORDER — PHENOBARBITAL 16.2 MG PO TABS: 64.8000 mg | ORAL_TABLET | Freq: Three times a day (TID) | ORAL | Status: DC

## 2022-07-06 MED ORDER — PHENOBARBITAL 16.2 MG PO TABS: 32.4000 mg | ORAL_TABLET | Freq: Three times a day (TID) | ORAL | Status: DC

## 2022-07-06 MED ORDER — LORAZEPAM 1 MG PO TABS: 0.0000 mg | ORAL_TABLET | ORAL | Status: DC | PRN

## 2022-07-06 MED ORDER — PROCHLORPERAZINE EDISYLATE 10 MG/2ML IJ SOLN
5.0000 mg | INTRAMUSCULAR | Status: DC | PRN
  Administered 2022-07-09 – 2022-07-11 (×3): 5 mg via INTRAVENOUS
  Filled 2022-07-06 (×2): qty 2
  Filled 2022-07-06 (×3): qty 1

## 2022-07-06 MED ORDER — LORAZEPAM 2 MG/ML IJ SOLN
0.0000 mg | INTRAMUSCULAR | Status: DC
  Administered 2022-07-06 (×2): 3 mg via INTRAVENOUS
  Filled 2022-07-06 (×2): qty 2

## 2022-07-06 MED ORDER — CHLORDIAZEPOXIDE HCL 25 MG PO CAPS: 25.0000 mg | ORAL_CAPSULE | Freq: Three times a day (TID) | ORAL | Status: DC

## 2022-07-06 MED ORDER — CHLORDIAZEPOXIDE HCL 25 MG PO CAPS: 25.0000 mg | ORAL_CAPSULE | Freq: Four times a day (QID) | ORAL | Status: DC

## 2022-07-06 MED ORDER — LACTATED RINGERS IV BOLUS
1000.0000 mL | Freq: Once | INTRAVENOUS | Status: AC
  Administered 2022-07-06: 1000 mL via INTRAVENOUS

## 2022-07-06 MED ORDER — PHENOBARBITAL 97.2 MG PO TABS
97.2000 mg | ORAL_TABLET | Freq: Three times a day (TID) | ORAL | Status: DC
  Administered 2022-07-06: 97.2 mg via ORAL
  Filled 2022-07-06: qty 1

## 2022-07-06 MED ORDER — LORAZEPAM 1 MG PO TABS: 1.0000 mg | ORAL_TABLET | ORAL | Status: DC | PRN

## 2022-07-06 MED ORDER — FOLIC ACID 1 MG PO TABS
1.0000 mg | ORAL_TABLET | Freq: Every day | ORAL | Status: DC
  Administered 2022-07-06 – 2022-07-12 (×7): 1 mg via ORAL
  Filled 2022-07-06 (×7): qty 1

## 2022-07-06 MED ORDER — LORAZEPAM 2 MG/ML IJ SOLN
1.0000 mg | INTRAMUSCULAR | Status: DC | PRN
  Administered 2022-07-06: 4 mg via INTRAVENOUS
  Filled 2022-07-06: qty 2

## 2022-07-06 MED ORDER — ALUM & MAG HYDROXIDE-SIMETH 200-200-20 MG/5ML PO SUSP
30.0000 mL | Freq: Once | ORAL | Status: DC
  Filled 2022-07-06: qty 30

## 2022-07-06 MED ORDER — LORAZEPAM 2 MG/ML IJ SOLN
INTRAMUSCULAR | Status: AC
  Administered 2022-07-06: 1 mg via INTRAMUSCULAR
  Filled 2022-07-06: qty 1

## 2022-07-06 MED ORDER — THIAMINE MONONITRATE 100 MG PO TABS
100.0000 mg | ORAL_TABLET | Freq: Every day | ORAL | Status: DC
  Administered 2022-07-08 – 2022-07-12 (×5): 100 mg via ORAL
  Filled 2022-07-06 (×5): qty 1

## 2022-07-06 MED ORDER — ONDANSETRON HCL 4 MG/2ML IJ SOLN
4.0000 mg | Freq: Once | INTRAMUSCULAR | Status: AC
  Administered 2022-07-06: 4 mg via INTRAVENOUS
  Filled 2022-07-06: qty 2

## 2022-07-06 MED ORDER — SODIUM CHLORIDE 0.9 % IV BOLUS
1000.0000 mL | Freq: Once | INTRAVENOUS | Status: AC
  Administered 2022-07-06: 1000 mL via INTRAVENOUS

## 2022-07-06 MED ORDER — LORAZEPAM 2 MG/ML IJ SOLN: 0.0000 mg | INTRAMUSCULAR | Status: DC

## 2022-07-06 MED ORDER — POTASSIUM CHLORIDE CRYS ER 20 MEQ PO TBCR: 40.0000 meq | EXTENDED_RELEASE_TABLET | Freq: Once | ORAL | Status: DC

## 2022-07-06 MED ORDER — LORAZEPAM 2 MG/ML IJ SOLN: 0.0000 mg | Freq: Two times a day (BID) | INTRAMUSCULAR | Status: DC

## 2022-07-06 NOTE — Assessment & Plan Note (Addendum)
TOC working on [employer] for Medicaid through [employer]. Pt provided with substance use resources. - TOC following Trying to connect pt with [facility].

## 2022-07-06 NOTE — ED Notes (Addendum)
Patient found in a slouched position in bed, snoring loudly, unable to open eyes, barely responds to painful stimuli's nor questions.  Unable to perform CIWA due to current patient mental status  DO Glendale Chard update notified of patient state

## 2022-07-06 NOTE — ED Notes (Signed)
Report called to Charge nurse at [facility] ED. Patient transferred emergently via EMS to ED. Patient a&ox4 at time of transport but noted to be shaking, sweating, and having difficulty standing. Actively going through alcochol withdrawal. VS taken and reassessed manually and 1mg  of ativan given IM. Patient able to communicate and is currently on the way to ED. Family member present at time of transport.

## 2022-07-06 NOTE — H&P (Cosign Needed Addendum)
Hospital Admission History and Physical Service Pager: [phone]  Patient name: Philip Cuevas Medical record number: [id number] Date of Birth: [birth year] Age: 41 y.o. Gender: male  Primary Care Provider: Silverio Decamp, MD Consultants: None Code Status: Full   Preferred Emergency Contact:  Contact Information     Name Relation Home [place] Spouse [phone]  [phone]   [place],Debbie Mother [phone]          Chief Complaint: Alcohol and Opioid Withdrawal  Assessment and Plan: Philip Cuevas is a 41 y.o. male presenting from [facility] for medical management of alcohol and opioid withdrawal symptoms for past 9 days.   * Alcohol withdrawal (HCC) Chronic alcohol use, drinks 730mL of Vodka daily. Has been drinking more to treat his opioid withdrawal symptoms (including N/V), which could lead to overall decreased alcohol intake. Pt reports tremors, N/V, heartburn, SOB, chest tightness, anxiety since stopping opioid 9 days ago. Pt presented to [facility] for detox of alcohol and opioid, but was directed to ED for medical workup of chest tightness, SOB, tachycardia. Pt is satting well on O2, EKG shows sinus tachy and CXR unremarkable, making cardiac etiologies such as ACS less likely. Overall, pt's symptoms are most likely due to a combination of alcohol and opioid withdrawal. Will admit for monitoring during acute withdrawal period. - Admit to Progressive with attending Dr. Erin Hearing - CIWA protocol w/ Ativan - daily thiamine, folate, multivit - PT/OT eval and treat - Seizure precaution - Neuro checks q4h - Cardiac monitoring - AM CBC, CMP, EKG - TOC consult for substance use resources   Hypokalemia Likely 2/2 poor PO, vomiting, diarrhea.  - AM CMP, consider repletion if still low  Hyponatremia Likely due to poor PO and vomiting.  - AM CMP - Encourage PO as tolerated  Fatty liver Stable hepatosteatosis noted on RUQ [place]. Labs notable  for transaminitis and bilirubinemia. Likely 2/2 to chronic alcohol use.  - f/u hepatitis panel  Lactic acidosis Pt presented with anion gap acidosis with elevated lactic acid, likely 2/2 to dehydration in setting of alcohol withdrawal vomiting.  - s/p 2L IVF bolus in ED - IVF hydration until PO improves - AM CMP  Opioid use disorder Hx of using 0.5 gram of heroin daily and tried to stop ~9days ago. Since then, he reports tremors, N/V, diarrhea. He tried to take more alcohol to treat the opioid withdrawal but it only got worse. I suspect N/V from opioid withdrawal exacerbated his alcohol withdrawal (see above). - monitor COWS - TOC consult for substance use resources  Poor social situation Has been uninsured and not taking meds due to inability to afford. - TOC consult for resources  Nausea & vomiting Reports nonbloody vomiting and inability to tolerate PO except alcohol for the past week. He also has a sore throat, which I suspect is 2/2 forceful wretching, low suspicion for active bleed such as mallory-weiss or varices.  - Compazine prn for N/V - IVF NS @ 66ml/hr  HTN (hypertension) Has hx of HTN, but is not taking meds due to lack of insurance. Alcohol withdrawal is likely contributing.  - Consider restarting BP meds if BP remains elevated after withdrawal. Was previously on Amlo 5mg  daily and Benicar. - monitor BP    FEN/GI: Regular VTE Prophylaxis: Lovenox  Disposition: Progressive  History of Present Illness:  Philip Cuevas is a 41 y.o. male presenting with tachycardia, N/V, tremors, anxiety, SOB, and chest tightness for 9 days.  He quit using opioids ~9 days ago (was using 0.5 gram heroin daily) and experienced tremors, N/V, inability to keep down food, and diarrhea. He tried coping with these symptoms with drinking more alcohol. He drinks 1/5 of vodka daily and sometimes beer. Has been drinking for ~20 years. He has also had pain with swallowing. Last drink was a can  of beer this morning. Denies ever having alcohol withdrawal or hospitalized for trying to quit alcohol. Has hx of seizures as a child, but not as an adult.   He presented to [facility] this morning for opioid and alcohol detox, was noted to have burning in throat, chest tightness, and SOB and was transported via EMS to [facility] for further medical monitoring.   In the ED, found to be tachypneic, tachycardic, hypertensive.  VBG and CMP showed anion gap acidosis.  CBC, INR, ethanol, CK wnl.  Chest x-ray unremarkable.  EKG showed sinus tachycardia.  Received 2L bolus, Ativan x2, thiamine, Zofran.   Pertinent Past Medical History: HTN, anxiety, depression, asthma, ED, obesity, alcohol use disorder, opioid use disorder  Remainder reviewed in history tab.   Pertinent Past Surgical History: Tonsilectomy Pilonidal cyst excision   Remainder reviewed in history tab.  Pertinent Social History: Tobacco use: Yes. 1/2pk of cigs daily. Started around 41yo. Alcohol use: vodka daily, sometimes beer Other Substance use: Was using heroin 0.5 grams daily until 9 days ago Lives with spouse  Pertinent Family History: Dad: depression, suicide Mom: HTN, HLD  Remainder reviewed in history tab.   Important Outpatient Medications: Currently not taking any meds because he is uninsured, but was previously prescribed: Albuterol 2 puff q6 prn Xanax 1mg  daily Amlo 5mg  daily Buspar 5mg  TID Fenofibrate 160 daily Benixar 40-25 daily Cialis 5mg  daily prn Testosterone 200mg  q14d Ambien 10mg  nightly prn  Remainder reviewed in medication history.   Objective: BP (!) 134/97   Pulse (!) 147   Temp 97.7 F (36.5 C)   Resp (!) 42   Ht 5\' 11"  (1.803 m)   Wt 117.9 kg   SpO2 95%   BMI 36.26 kg/m  Exam: General: Uncomfortably appearing man, laying in bed. Talking and alert HEENT: NCAT. PERRLA. Scleral icterus noted.  Cardiovascular: RRR, no murmurs Respiratory: CTAB, Normal WOB on RA Gastrointestinal:  Soft, nontender, nondistended. Normal BS. Neuro: resting tremor. No asterixis.  Ext: No pitting edema  Labs:  CBC BMET  Recent Labs  Lab 07/06/22 1232 07/06/22 1333  WBC 9.2  --   HGB 17.0 16.7  HCT 46.0 49.0  PLT 146*  --    Recent Labs  Lab 07/06/22 1232 07/06/22 1333  NA 128* 123*  K 3.5 3.3*  CL 87*  --   CO2 16*  --   BUN 8  --   CREATININE 1.00  --   GLUCOSE 162*  --   CALCIUM 9.3  --       EKG: My own interpretation: sinus tachycardia   Imaging Studies Performed:  CXR unremarkable   , MD 07/06/2022, 6:20 PM PGY-1, [facility]  I was personally present and performed or re-performed the history, physical exam and medical decision making activities of this service and have verified that the service and findings are accurately documented in the intern's note. My edits are noted within the note above. Please also see attending's attestation.   07/08/22, DO                  07/06/2022, 6:52  PM  PGY-3, [place] Intern pager: [phone], text pages welcome Secure chat group [place]

## 2022-07-06 NOTE — Assessment & Plan Note (Addendum)
BP control improving- now at goal. - Labetolol wean to complete this morning - Continue Amlodipine 5 mg daily - monitor BP

## 2022-07-06 NOTE — Progress Notes (Addendum)
FMTS Interim Progress Note  CIWA of 27 Orders changed to include CIWA >20 to call rapid response.   Assessed patient with Dr. Clayborne Artist, MEWS of 6. Patient in no acute distress but tachypneic, tachycardic to the 140s.  He reports continued visual hallucinations which have been present since this afternoon.  Phenobarbital taper ordered. Initially was planning to do Librium and Phenobarb taper, per pharmacy request d/c Librium to start phenobarb.   CIWA protocol updated to step-down order set   Glendale Chard, DO 07/06/2022, 10:03 PM PGY-1, [facility] Family Medicine Service pager [phone]

## 2022-07-06 NOTE — Assessment & Plan Note (Addendum)
 Improving. CIWA 5-6 overnight. No Ativan  administered. - CIWA scoring; Ativan  PRN if CIWA >8 - Thiamine , Folate, MV - Continue phenobarbital  taper

## 2022-07-06 NOTE — Progress Notes (Signed)
FMTS Interim Progress Note  S: Evaluated patient with Dr. Clayborne Artist. Family at bedside. Patient is delirious and is not oriented to location or situation. Family at bedside with questions about withdrawal. Explained withdrawal course and expectations over the next few days. Wife is unsure if patient will be willing to go to rehab after discharging form the hospital.   O: BP (!) 149/91   Pulse (!) 143   Temp 97.7 F (36.5 C)   Resp (!) 42   Ht 5\' 11"  (1.803 m)   Wt 117.9 kg   SpO2 95%   BMI 36.26 kg/m   Agitated, no acute distress  Tachypnic     A/P: Alcohol Withdrawal  Tenuous as this is his first withdrawal and has been drinking heavily over the past few weeks with little to no food. Monitor for wernicke's encephalopathy.   - CIWA q2h, if given Ativan will need to reassess every hour until Ativan is not given  - Librium taper started  - low threshold to start phenobarbital taper   , DO 07/06/2022, 8:32 PM PGY-1, [facility] Family Medicine Service pager [phone]

## 2022-07-06 NOTE — ED Notes (Signed)
Pt to US.

## 2022-07-06 NOTE — Assessment & Plan Note (Signed)
Likely due to poor PO and vomiting.  - AM CMP - Encourage PO as tolerated

## 2022-07-06 NOTE — BH Assessment (Signed)
Pt reports he stop snorting opioids about 9 days ago and he has been "coping" by drinking fifth of liquor daily. Pt reports things were going ok but two days ago he started to have increase in withdrawal symptoms. Currently pt is sweating, having difficult time breathing difficulty standing, and shaking. Pt reports for the past week he has not been able to keep anything down other than alcohol. Pt reports other symptoms of vomiting, nausea, "fire in throat", double vision, diarrhea and feeling hot/cold. Pt denies history of withdrawal seizure however, girlfriend reports last night she thought patient had a seizure. Pt reports using snorting 1/2 gram of opioids for the past couple of years and he reports drinking about fifth of liquor for 20 years.

## 2022-07-06 NOTE — Assessment & Plan Note (Addendum)
Had throat pain on admission that is still present, most likely due to forceful vomiting prior to admission. Reports pain with swallowing leading to decreased PO. On exam, mucous membranes dry.  - LR @ 81mL/hr for 8hrs - Chloraseptic spray prn for throat pain - Encouraged PO - Compazine prn for N/V

## 2022-07-06 NOTE — Assessment & Plan Note (Deleted)
Pt presented with anion gap acidosis with elevated lactic acid, likely 2/2 to dehydration in setting of alcohol withdrawal vomiting.  - s/p 2L IVF bolus in ED - IVF hydration until PO improves - AM CMP

## 2022-07-06 NOTE — ED Notes (Signed)
Ativan 0.80ml wasted in stericycle bin, witnessed by Roseanne Reno, RN. Pharmacist notified.

## 2022-07-06 NOTE — ED Notes (Signed)
Patient noted by this writer to have extensive bruising and abrasions to left arm. Patient stated he fell through a porch about a week ago and was not seen by a MD for that. Patient was evaluated by MD Rhunette Croft. No scans or imaging ordered at this time.

## 2022-07-06 NOTE — Assessment & Plan Note (Addendum)
 On Suboxone , but not taking as prescribed.  He says he would rather take this once daily, but really would rather not be on at all.  Completed clonidine  wean. - D/c suboxone  - COWS - Trying to set up outpatient f/u care prior to discharge

## 2022-07-06 NOTE — ED Provider Notes (Signed)
[facility] EMERGENCY DEPARTMENT Provider Note   CSN: [id number] Arrival date & time: 07/06/22  1209     History {Add pertinent medical, surgical, social history, OB history to HPI:1} Chief Complaint  Patient presents with   Withdrawal    Philip Cuevas is a 41 y.o. male.  HPI     Home Medications Prior to Admission medications   Medication Sig Start Date End Date Taking? Authorizing Provider  albuterol (VENTOLIN HFA) 108 (90 Base) MCG/ACT inhaler Inhale 2 puffs into the lungs every 6 (six) hours as needed. Patient taking differently: Inhale 2 puffs into the lungs every 6 (six) hours as needed for shortness of breath. 06/17/21   Monica Becton, MD  ALPRAZolam Prudy Feeler) 1 MG tablet TAKE 1 TABLET BY MOUTH EVERY DAY Patient taking differently: Take 1 mg by mouth daily. 07/28/21   Monica Becton, MD  amLODipine (NORVASC) 5 MG tablet Take 1 tablet (5 mg total) by mouth daily. 06/17/21   Monica Becton, MD  busPIRone (BUSPAR) 5 MG tablet Take 1 tablet (5 mg total) by mouth 3 (three) times daily. 06/17/21   Monica Becton, MD  fenofibrate 160 MG tablet Take 1 tablet (160 mg total) by mouth daily. 06/18/21   Monica Becton, MD  NEEDLE, DISP, 22 G 22G X 1-1/2" MISC Please use to inject testosterone intramuscular every 2 weeks 06/23/21   Monica Becton, MD  olmesartan-hydrochlorothiazide (BENICAR HCT) 40-25 MG tablet Take 1 tablet by mouth daily. 06/17/21   Monica Becton, MD  Syringe/Needle, Disp, (SYRINGE 3CC/18GX1-1/2") 18G X 1-1/2" 3 ML MISC Please use every 2 weeks to draw up testosterone, used 22-gauge needle to inject IM 06/23/21   Monica Becton, MD  tadalafil (CIALIS) 5 MG tablet Take 1 tablet (5 mg total) by mouth daily. 06/17/21   Monica Becton, MD  testosterone cypionate (DEPOTESTOSTERONE CYPIONATE) 200 MG/ML injection Inject 1 mL (200 mg total) into the muscle every 14 (fourteen) days.  06/23/21   Monica Becton, MD  zolpidem (AMBIEN) 10 MG tablet Take 1 tablet (10 mg total) by mouth at bedtime as needed for sleep. 06/17/21   Monica Becton, MD      Allergies    Patient has no known allergies.    Review of Systems   Review of Systems  Physical Exam Updated Vital Signs BP 136/69 (BP Location: Right Arm)   Pulse (!) 59   Temp 98.1 F (36.7 C) (Oral)   Resp 16   Ht 5\' 11"  (1.803 m)   Wt 117.9 kg   SpO2 97%   BMI 36.26 kg/m  Physical Exam  ED Results / Procedures / Treatments   Labs (all labs ordered are listed, but only abnormal results are displayed) Labs Reviewed  COMPREHENSIVE METABOLIC PANEL - Abnormal; Notable for the following components:      Result Value   Sodium 128 (*)    Chloride 87 (*)    CO2 16 (*)    Glucose, Bld 162 (*)    AST 219 (*)    ALT 179 (*)    Total Bilirubin 2.8 (*)    Anion gap 25 (*)    All other components within normal limits  CBC WITH DIFFERENTIAL/PLATELET - Abnormal; Notable for the following components:   MCH 35.2 (*)    MCHC 37.0 (*)    Platelets 146 (*)    nRBC 1.1 (*)    Neutro Abs 8.1 (*)    Lymphs  Abs 0.4 (*)    All other components within normal limits  I-STAT VENOUS BLOOD GAS, ED - Abnormal; Notable for the following components:   pCO2, Ven 28.5 (*)    pO2, Ven 153 (*)    Bicarbonate 18.2 (*)    TCO2 19 (*)    Acid-base deficit 5.0 (*)    Sodium 123 (*)    Potassium 3.3 (*)    Calcium, Ion 1.00 (*)    All other components within normal limits  PROTIME-INR  ETHANOL  CK  RAPID URINE DRUG SCREEN, HOSP PERFORMED  URINALYSIS, ROUTINE W REFLEX MICROSCOPIC  HEPATITIS PANEL, ACUTE    EKG None  Radiology No results found.  Procedures Procedures  {Document cardiac monitor, telemetry assessment procedure when appropriate:1}  Medications Ordered in ED Medications  LORazepam (ATIVAN) injection 0-4 mg (2 mg Intravenous Given 07/06/22 1344)    Or  LORazepam (ATIVAN) tablet 0-4 mg (  Oral See Alternative 07/06/22 1344)  LORazepam (ATIVAN) injection 0-4 mg (has no administration in time range)    Or  LORazepam (ATIVAN) tablet 0-4 mg (has no administration in time range)  thiamine (VITAMIN B1) tablet 100 mg ( Oral See Alternative 07/06/22 1347)    Or  thiamine (VITAMIN B1) injection 100 mg (100 mg Intravenous Given 07/06/22 1347)  nicotine (NICODERM CQ - dosed in mg/24 hours) patch 21 mg (21 mg Transdermal Patch Applied 07/06/22 1354)  alum & mag hydroxide-simeth (MAALOX/MYLANTA) 200-200-20 MG/5ML suspension 30 mL (0 mLs Oral Hold 07/06/22 1453)  sodium chloride 0.9 % bolus 1,000 mL (0 mLs Intravenous Stopped 07/06/22 1448)  LORazepam (ATIVAN) injection 1 mg (1 mg Intravenous Given 07/06/22 1344)  lactated ringers bolus 1,000 mL (1,000 mLs Intravenous New Bag/Given 07/06/22 1449)  ondansetron (ZOFRAN) injection 4 mg (4 mg Intravenous Given 07/06/22 1452)    ED Course/ Medical Decision Making/ A&P                           Medical Decision Making Amount and/or Complexity of Data Reviewed Labs: ordered. Radiology: ordered.  Risk OTC drugs. Prescription drug management. Decision regarding hospitalization.   ***  {Document critical care time when appropriate:1} {Document review of labs and clinical decision tools ie heart score, Chads2Vasc2 etc:1}  {Document your independent review of radiology images, and any outside records:1} {Document your discussion with family members, caretakers, and with consultants:1} {Document social determinants of health affecting pt's care:1} {Document your decision making why or why not admission, treatments were needed:1} Final Clinical Impression(s) / ED Diagnoses Final diagnoses:  None   41 year old male comes in with chief complaint of nausea, vomiting, elevated heart rate.  Patient has history of poorly controlled hypertension, alcohol use disorder and opioid use disorder.  Patient indicates that he stopped using heroin  cold [place] about 7 days ago.  He has been struggling with the alcohol use since.  He has actually increased his alcohol consumption, trying to cope with heroin withdrawal.  Ultimately has decided to stop drinking alcohol and went to behavioral health urgent care where he was found to be tachycardic and sent to the emergency room.  Patient states that he has been having a lot of nausea and vomiting. No melena.  There has been some dark emesis, but no known history of bloody emesis.  Patient does not have a PCP, and has not seen a physician in several years.  He is noted to be tachycardic with heart rate in the 150s.  Differential diagnosis for that includes sinus tachycardia, PSVT, atrial flutter. Given his last binge drinking was on Sunday, this could be holiday heart. Withdrawals also contributing likely.  History is not suggestive of any underlying infection.  Exam does not show any clinically concerning trauma.  There is significant ecchymosis over left upper extremity, but he is able to range his elbow shoulder without any difficulty.  Basic labs ordered, and there is evidence of hyponatremia, elevated LFTs, low bicarb and increased anion gap.  Increased anion gap likely because of starvation ketosis.  Patient will need admission to the hospital for optimization.  With the elevated LFTs, have ordered hepatitis panel and ultrasound.   Rx / DC Orders ED Discharge Orders     None

## 2022-07-06 NOTE — ED Triage Notes (Signed)
Pt BIBA from [facility]. Pt is withdrawing from ETOH and heroin. Pt had small amount to drink this morning. Pt has been coming off of heroin - last use 9 days ago. Pt c/o chest pain- sinus tach at 150. BP has been elevated as well.Pt c/o nausea and shakiness as well.

## 2022-07-06 NOTE — Discharge Instructions (Addendum)
Transfer to [facility] for medical clearance. °

## 2022-07-06 NOTE — ED Notes (Signed)
Admitting MD paged regarding pt's continued need for ativan/ withdrawal concerns. This RN advised that additional orders to be placed

## 2022-07-06 NOTE — ED Notes (Signed)
Attempted to call charge nurse at [facility] EDx4 for report with no answer. Will continue attempt to reach charge nurse.

## 2022-07-06 NOTE — Assessment & Plan Note (Addendum)
Stable hepatosteatosis noted on RUQ [place]. Labs notable for transaminitis and bilirubinemia. Likely 2/2 to chronic alcohol use.  - f/u hepatitis panel

## 2022-07-06 NOTE — Assessment & Plan Note (Signed)
Likely 2/2 poor PO, vomiting, diarrhea.  - AM CMP, consider repletion if still low

## 2022-07-06 NOTE — ED Provider Notes (Signed)
[facility]al [facility]gent [facility] Medical Screening Exam  Patient Name: Philip C[facility]vas MRN: [id number] Date of Evaluation: 07/06/22 Chief Complaint:   Diagnosis:  Final diagnoses:  Alcohol abuse    History of Present illness: Philip Cuevas is a 41 y.o. male.  Philip C[facility]vas 41 y.o., male patient presented to [facility] as a walk in with his girlfriend requesting alcohol and opioid detox.  Philip [facility]uevas, 41 y.o., male patient seen face to face by this provider, consulted with Dr. Lucianne Muss; and chart reviewed on 07/06/22.   On evaluation Philip Cuevas reports he has been using one half a gram opioids for the past few years. He stopped his opioid use roughly 9 days ago but has been coping with his withdrawal symptoms by drinking 1/5 of vodka daily and sometimes multiple beers per day.  He has been drinking for roughly 20 years.  His last drink was this morning when he drank 1 beer.  He denies any history of alcohol withdrawal seizures or DTs.  His girlfriend states it is possible he could of had a seizure last night however she is unsure and patient did not seek any medical assistance.  He did call EMS this morning due to his withdrawal symptoms but states they told him that if they took him to the [facility] he would wait for hours so he declined transportation.  He then presented to [facility] requesting assistance with detox.  He is currently endorsing alcohol withdrawal symptoms of sweating, double vision, shortness of breath, shaking, unsteady gait, nausea, and diarrhea.  States he has been unable to eat for the past week as well.  He is visibly diaphoretic and shaking all over.  He is tachycardic and has elevated blood pressure.  He is complaining of a burning in his throat, chest tightness and shortness of breath which will need medical attention.  [facility] emergency department Dr. Jasmine December notified and has accepted patient.  He will be transported via EMS.  Explained plan of care with  patient and he is in agreement.   Discussed being reviewed by [place] [facility] provider for facility base crisis admission after medical clearance.  Patient is in agreement.  During evaluation Philip Cuevas is observed sitting in the assessment room in acute distress.  He is alert/oriented x4, cooperative, and fairly inattentive.  He is disheveled and smells of alcohol.  His speech is clear, coherent, normal rate and tone.  He denies SI/HI/AVH.  He does not appear to be responding to internal/external stimuli.   Flowsheet Row ED from 05/22/2021 in [facility] EMERGENCY DEPARTMENT  Philip [facility]vas RISK CATEGORY No Risk       Psychiatric Specialty Exam  Presentation  General Appearance:Disheveled  Eye Contact:Fleeting  Speech:Clear and Coherent; Normal Rate  Speech Volume:Normal  Handedness:Right   Mood and Affect  Mood: Anxious  Affect: Congruent   Thought Process  Thought Processes: Coherent  Descriptions of Associations:Intact  Orientation:Full (Time, Place and Person)  Thought Content:Logical    Hallucinations:None  Ideas of Reference:None  Suicidal Thoughts:No  Homicidal Thoughts:No   Sensorium  Memory: Immediate Good; Recent Good; Remote Good  Judgment: Fair  Insight: Fair   Art therapist  Concentration: Fair  Attention Span: Fair  Recall: Fair  Fund of Knowledge: Good  Language: Good   Psychomotor Activity  Psychomotor Activity: Restlessness; Tremor   Assets  Assets: Desire for Improvement; Financial Resources/Insurance; Manufacturing systems engineer; Physical Health; Resilience; Social Support   Sleep  Sleep: Fair  Number of  hours: No data recorded  No data recorded  Physical Exam: Physical Exam Vitals and nursing note reviewed.  Constitutional:      General: He is in acute distress.     Appearance: He is well-developed.  HENT:     Head: Normocephalic and atraumatic.  Eyes:     General:        Right eye: No  discharge.        Left eye: No discharge.  Cardiovascular:     Rate and Rhythm: Tachycardia present.  Pulmonary:     Effort: Respiratory distress (reports SOB) present.  Musculoskeletal:        General: Normal range of motion.     Cervical back: Normal range of motion.     Comments: Unsteady gait and tremors  Skin:    Capillary Refill: Capillary refill takes less than 2 seconds.  Neurological:     Mental Status: He is alert and oriented to person, place, and time.  Psychiatric:        Attention and Perception: He is inattentive.        Mood and Affect: Mood is anxious.        Speech: Speech normal.        Behavior: Behavior is cooperative.        Thought Content: Thought content normal.        Cognition and Memory: Cognition normal.        Judgment: Judgment normal.    Review of Systems  Constitutional:  Positive for chills and diaphoresis.  HENT:  Positive for sore throat (has burning in his throat).   Respiratory:  Negative for cough.   Cardiovascular:  Positive for chest pain (chest tightness and SOB).  Gastrointestinal:  Positive for diarrhea and nausea.  Genitourinary: Negative.   Musculoskeletal: Negative.   Neurological:  Positive for tremors.  Psychiatric/Behavioral:  Positive for substance abuse. The patient is nervous/anxious.    Blood pressure (!) 150/90, pulse (!) 104, temperature 98.9 F (37.2 C), temperature source Temporal, resp. rate 20, SpO2 99 %. There is no height or weight on file to calculate BMI.  Musculoskeletal: Strength & Muscle Tone: within normal limits Gait & Station: unsteady Patient leans: N/A   [facility] M[facility] Discharge Disposition for Follow up and Recommendations: Based on my evaluation the patient appears to have an emergency medical condition for which I recommend the patient be transferred to the emergency department for further evaluation.   Discharge patient and transported via EMS to [facility] emergency department.  Dr. Jasmine December is the  excepting physician.  Administer 1 mg IM of Ativan now  Philip Hughs, NP 07/06/2022, 11:33 AM

## 2022-07-07 ENCOUNTER — Other Ambulatory Visit: Payer: Self-pay | Admitting: Sports Medicine

## 2022-07-07 ENCOUNTER — Inpatient Hospital Stay (HOSPITAL_COMMUNITY): Payer: Medicaid Other

## 2022-07-07 DIAGNOSIS — Z597 Insufficient social insurance and welfare support: Secondary | ICD-10-CM | POA: Diagnosis not present

## 2022-07-07 DIAGNOSIS — Z79899 Other long term (current) drug therapy: Secondary | ICD-10-CM | POA: Diagnosis not present

## 2022-07-07 DIAGNOSIS — R070 Pain in throat: Secondary | ICD-10-CM | POA: Diagnosis present

## 2022-07-07 DIAGNOSIS — E860 Dehydration: Secondary | ICD-10-CM | POA: Diagnosis present

## 2022-07-07 DIAGNOSIS — F10931 Alcohol use, unspecified with withdrawal delirium: Secondary | ICD-10-CM

## 2022-07-07 DIAGNOSIS — Z8249 Family history of ischemic heart disease and other diseases of the circulatory system: Secondary | ICD-10-CM | POA: Diagnosis not present

## 2022-07-07 DIAGNOSIS — J45909 Unspecified asthma, uncomplicated: Secondary | ICD-10-CM | POA: Diagnosis present

## 2022-07-07 DIAGNOSIS — E872 Acidosis, unspecified: Secondary | ICD-10-CM | POA: Diagnosis present

## 2022-07-07 DIAGNOSIS — Z6836 Body mass index (BMI) 36.0-36.9, adult: Secondary | ICD-10-CM | POA: Diagnosis not present

## 2022-07-07 DIAGNOSIS — E876 Hypokalemia: Secondary | ICD-10-CM | POA: Diagnosis present

## 2022-07-07 DIAGNOSIS — E781 Pure hyperglyceridemia: Secondary | ICD-10-CM | POA: Diagnosis present

## 2022-07-07 DIAGNOSIS — G4700 Insomnia, unspecified: Secondary | ICD-10-CM | POA: Diagnosis present

## 2022-07-07 DIAGNOSIS — K7010 Alcoholic hepatitis without ascites: Secondary | ICD-10-CM | POA: Diagnosis present

## 2022-07-07 DIAGNOSIS — R079 Chest pain, unspecified: Secondary | ICD-10-CM

## 2022-07-07 DIAGNOSIS — E871 Hypo-osmolality and hyponatremia: Secondary | ICD-10-CM | POA: Diagnosis present

## 2022-07-07 DIAGNOSIS — G4733 Obstructive sleep apnea (adult) (pediatric): Secondary | ICD-10-CM | POA: Diagnosis present

## 2022-07-07 DIAGNOSIS — Z818 Family history of other mental and behavioral disorders: Secondary | ICD-10-CM | POA: Diagnosis not present

## 2022-07-07 DIAGNOSIS — F1123 Opioid dependence with withdrawal: Secondary | ICD-10-CM | POA: Diagnosis present

## 2022-07-07 DIAGNOSIS — F10231 Alcohol dependence with withdrawal delirium: Secondary | ICD-10-CM | POA: Diagnosis not present

## 2022-07-07 DIAGNOSIS — I10 Essential (primary) hypertension: Secondary | ICD-10-CM | POA: Diagnosis present

## 2022-07-07 DIAGNOSIS — F419 Anxiety disorder, unspecified: Secondary | ICD-10-CM | POA: Diagnosis present

## 2022-07-07 DIAGNOSIS — Z91141 Patient's other noncompliance with medication regimen due to financial hardship: Secondary | ICD-10-CM | POA: Diagnosis not present

## 2022-07-07 DIAGNOSIS — F17210 Nicotine dependence, cigarettes, uncomplicated: Secondary | ICD-10-CM | POA: Diagnosis present

## 2022-07-07 DIAGNOSIS — K760 Fatty (change of) liver, not elsewhere classified: Secondary | ICD-10-CM | POA: Diagnosis present

## 2022-07-07 DIAGNOSIS — F1190 Opioid use, unspecified, uncomplicated: Secondary | ICD-10-CM

## 2022-07-07 HISTORY — DX: Alcohol use, unspecified with withdrawal delirium: F10.931

## 2022-07-07 LAB — COMPREHENSIVE METABOLIC PANEL
ALT: 132 U/L — ABNORMAL HIGH (ref 0–44)
AST: 190 U/L — ABNORMAL HIGH (ref 15–41)
Albumin: 3.1 g/dL — ABNORMAL LOW (ref 3.5–5.0)
Alkaline Phosphatase: 60 U/L (ref 38–126)
Anion gap: 10 (ref 5–15)
BUN: 10 mg/dL (ref 6–20)
CO2: 24 mmol/L (ref 22–32)
Calcium: 8.4 mg/dL — ABNORMAL LOW (ref 8.9–10.3)
Chloride: 93 mmol/L — ABNORMAL LOW (ref 98–111)
Creatinine, Ser: 0.71 mg/dL (ref 0.61–1.24)
GFR, Estimated: >60 mL/min (ref >60)
Glucose, Bld: 101 mg/dL — ABNORMAL HIGH (ref 70–99)
Potassium: 3 mmol/L — ABNORMAL LOW (ref 3.5–5.1)
Sodium: 127 mmol/L — ABNORMAL LOW (ref 135–145)
Total Bilirubin: 2.7 mg/dL — ABNORMAL HIGH (ref 0.3–1.2)
Total Protein: 5.6 g/dL — ABNORMAL LOW (ref 6.5–8.1)

## 2022-07-07 LAB — CBC
HCT: 37.1 % — ABNORMAL LOW (ref 39.0–52.0)
Hemoglobin: 13.6 g/dL (ref 13.0–17.0)
MCH: 35.4 pg — ABNORMAL HIGH (ref 26.0–34.0)
MCHC: 36.7 g/dL — ABNORMAL HIGH (ref 30.0–36.0)
MCV: 96.6 fL (ref 80.0–100.0)
Platelets: 111 10*3/uL — ABNORMAL LOW (ref 150–400)
RBC: 3.84 MIL/uL — ABNORMAL LOW (ref 4.22–5.81)
RDW: 13.3 % (ref 11.5–15.5)
WBC: 5.6 10*3/uL (ref 4.0–10.5)
nRBC: 1.4 % — ABNORMAL HIGH (ref 0.0–0.2)

## 2022-07-07 LAB — ECHOCARDIOGRAM COMPLETE
Area-P 1/2: 5.23 cm2
Height: 71 in
MV M vel: 1.31 m/s
MV Peak grad: 6.9 mmHg
Weight: 4160 oz

## 2022-07-07 LAB — MRSA NEXT GEN BY PCR, NASAL: MRSA by PCR Next Gen: NOT DETECTED

## 2022-07-07 LAB — GLUCOSE, CAPILLARY
Glucose-Capillary: 110 mg/dL — ABNORMAL HIGH (ref 70–99)
Glucose-Capillary: 109 mg/dL — ABNORMAL HIGH (ref 70–99)
Glucose-Capillary: 104 mg/dL — ABNORMAL HIGH (ref 70–99)
Glucose-Capillary: 89 mg/dL (ref 70–99)
Glucose-Capillary: 120 mg/dL — ABNORMAL HIGH (ref 70–99)
Glucose-Capillary: 111 mg/dL — ABNORMAL HIGH (ref 70–99)
Glucose-Capillary: 116 mg/dL — ABNORMAL HIGH (ref 70–99)

## 2022-07-07 LAB — HEPATITIS PANEL, ACUTE
HCV Ab: NONREACTIVE
Hep A IgM: NONREACTIVE
Hep B C IgM: NONREACTIVE
Hepatitis B Surface Ag: NONREACTIVE

## 2022-07-07 LAB — MAGNESIUM: Magnesium: 1.9 mg/dL (ref 1.7–2.4)

## 2022-07-07 LAB — HIV ANTIBODY (ROUTINE TESTING W REFLEX): HIV Screen 4th Generation wRfx: NONREACTIVE

## 2022-07-07 MED ORDER — PHENOBARBITAL 32.4 MG PO TABS: 32.4000 mg | ORAL_TABLET | Freq: Three times a day (TID) | ORAL | Status: DC

## 2022-07-07 MED ORDER — PANTOPRAZOLE SODIUM 40 MG IV SOLR
40.0000 mg | INTRAVENOUS | Status: DC
  Administered 2022-07-07 – 2022-07-08 (×2): 40 mg via INTRAVENOUS
  Filled 2022-07-07 (×2): qty 10

## 2022-07-07 MED ORDER — ORAL CARE MOUTH RINSE: 15.0000 mL | OROMUCOSAL | Status: DC | PRN

## 2022-07-07 MED ORDER — LACTATED RINGERS IV SOLN: INTRAVENOUS | Status: DC

## 2022-07-07 MED ORDER — LORAZEPAM 2 MG/ML IJ SOLN
1.0000 mg | INTRAMUSCULAR | Status: DC | PRN
  Administered 2022-07-08 – 2022-07-10 (×11): 1 mg via INTRAVENOUS
  Filled 2022-07-07 (×11): qty 1

## 2022-07-07 MED ORDER — DEXMEDETOMIDINE HCL IN NACL 400 MCG/100ML IV SOLN
0.4000 ug/kg/h | INTRAVENOUS | Status: DC
  Administered 2022-07-07: 0.4 ug/kg/h via INTRAVENOUS
  Filled 2022-07-07: qty 100

## 2022-07-07 MED ORDER — POTASSIUM CHLORIDE 10 MEQ/100ML IV SOLN
10.0000 meq | INTRAVENOUS | Status: AC
  Administered 2022-07-07 (×4): 10 meq via INTRAVENOUS
  Filled 2022-07-07 (×4): qty 100

## 2022-07-07 MED ORDER — ENOXAPARIN SODIUM 60 MG/0.6ML IJ SOSY
55.0000 mg | PREFILLED_SYRINGE | INTRAMUSCULAR | Status: DC
  Administered 2022-07-07 – 2022-07-11 (×5): 55 mg via SUBCUTANEOUS
  Filled 2022-07-07 (×5): qty 0.6

## 2022-07-07 MED ORDER — CHLORHEXIDINE GLUCONATE CLOTH 2 % EX PADS
6.0000 | MEDICATED_PAD | Freq: Every day | CUTANEOUS | Status: DC
  Administered 2022-07-07 – 2022-07-12 (×6): 6 via TOPICAL

## 2022-07-07 MED ORDER — POTASSIUM CHLORIDE CRYS ER 20 MEQ PO TBCR
40.0000 meq | EXTENDED_RELEASE_TABLET | Freq: Three times a day (TID) | ORAL | Status: DC
  Filled 2022-07-07: qty 2

## 2022-07-07 MED ORDER — CLONIDINE HCL 0.1 MG PO TABS
0.1000 mg | ORAL_TABLET | Freq: Two times a day (BID) | ORAL | Status: DC
  Administered 2022-07-07 (×2): 0.1 mg via ORAL
  Filled 2022-07-07 (×2): qty 1

## 2022-07-07 MED ORDER — LOPERAMIDE HCL 2 MG PO CAPS
2.0000 mg | ORAL_CAPSULE | Freq: Three times a day (TID) | ORAL | Status: AC | PRN
  Administered 2022-07-07 (×2): 2 mg via ORAL
  Filled 2022-07-07: qty 1

## 2022-07-07 MED ORDER — NICOTINE 21 MG/24HR TD PT24
21.0000 mg | MEDICATED_PATCH | Freq: Every day | TRANSDERMAL | Status: DC
  Administered 2022-07-07 – 2022-07-12 (×6): 21 mg via TRANSDERMAL
  Filled 2022-07-07 (×6): qty 1

## 2022-07-07 MED ORDER — POTASSIUM CHLORIDE 20 MEQ PO PACK
40.0000 meq | PACK | Freq: Three times a day (TID) | ORAL | Status: AC
  Administered 2022-07-07 (×3): 40 meq via ORAL
  Filled 2022-07-07 (×3): qty 2

## 2022-07-07 MED ORDER — PERFLUTREN LIPID MICROSPHERE
1.0000 mL | INTRAVENOUS | Status: AC | PRN
  Administered 2022-07-07: 4 mL via INTRAVENOUS

## 2022-07-07 MED ORDER — PHENOBARBITAL 32.4 MG PO TABS
64.8000 mg | ORAL_TABLET | Freq: Three times a day (TID) | ORAL | Status: DC
  Administered 2022-07-07 (×3): 64.8 mg via ORAL
  Filled 2022-07-07 (×3): qty 2

## 2022-07-07 MED ORDER — LOPERAMIDE HCL 2 MG PO CAPS
2.0000 mg | ORAL_CAPSULE | Freq: Three times a day (TID) | ORAL | Status: DC | PRN
  Administered 2022-07-09: 2 mg via ORAL
  Filled 2022-07-07: qty 1

## 2022-07-07 NOTE — Progress Notes (Signed)
eLink Physician-Brief Progress Note Patient Name: Philip Cuevas DOB: [birth year] MRN: [id number]   Date of Service  07/07/2022  HPI/Events of Note  Patient with loose stools, nothing to suggest enteral infection.  eICU Interventions  PRN Imodium ordered.        Thomasene Lot Madge Therrien 07/07/2022, 3:15 AM

## 2022-07-07 NOTE — Progress Notes (Signed)
eLink Physician-Brief Progress Note Patient Name: KALIB BHAGAT DOB: [birth year] MRN: [id number]   Date of Service  07/07/2022  HPI/Events of Note  Patient with poly-substance abuse involving Heroin and ETOH admitted for ETOH and illicit substance withdrawal syndrome.  eICU Interventions  New Patient Evaluation.        Thomasene Lot Jeilyn Reznik 07/07/2022, 2:18 AM

## 2022-07-07 NOTE — Evaluation (Signed)
Physical Therapy Evaluation Patient Details Name: Philip Cuevas MRN: [id number] DOB: Mar 20, 1981 Today's Date: 07/07/2022  History of Present Illness  41 yo male admitted 11/13 with ETOH and heroin withdrawal. PMHx: polysubstance abuse, asthma, insomnia, HLD, obesity, OSA  Clinical Impression  Pt lethargic with decreased ability to maintain attention needing frequent cues and redirection. Pt with decreased balance, transfers, gait and function who will benefit from acute therapy to maximize mobility, safety and independence to decrease burden of care.   HR 120 at rest, 150 with standing and limited gait SPO2 92-94% on RA       Recommendations for follow up therapy are one component of a multi-disciplinary discharge planning process, led by the attending physician.  Recommendations may be updated based on patient status, additional functional criteria and insurance authorization.  Follow Up Recommendations Home health PT      Assistance Recommended at Discharge Frequent or constant Supervision/Assistance  Patient can return home with the following  A little help with walking and/or transfers;A little help with bathing/dressing/bathroom;Assistance with cooking/housework;Assist for transportation;Direct supervision/assist for medications management;Direct supervision/assist for financial management;Help with stairs or ramp for entrance    Equipment Recommendations Rolling walker (2 wheels)  Recommendations for Other Services  OT consult    Functional Status Assessment Patient has had a recent decline in their functional status and demonstrates the ability to make significant improvements in function in a reasonable and predictable amount of time.     Precautions / Restrictions Precautions Precautions: Fall;Other (comment) Precaution Comments: watch HR      Mobility  Bed Mobility Overal bed mobility: Needs Assistance Bed Mobility: Supine to Sit     Supine to sit: Min  guard     General bed mobility comments: guarding for lines and safety with cues to initiate mobility    Transfers Overall transfer level: Needs assistance   Transfers: Sit to/from Stand, Bed to chair/wheelchair/BSC Sit to Stand: Min assist Stand pivot transfers: Min assist         General transfer comment: min assist to stand from bed with cues for safety and assist for balance with initial posterior bias. Pt stand pivot with RW min assist with HR 150 with standing    Ambulation/Gait Ambulation/Gait assistance: Min assist Gait Distance (Feet): 10 Feet Assistive device: Rolling walker (2 wheels) Gait Pattern/deviations: Step-through pattern   Gait velocity interpretation: <1.8 ft/sec, indicate of risk for recurrent falls   General Gait Details: decreased stride length with physical assist to control and direct RW, limited by lethargy and HR  Stairs            Wheelchair Mobility    Modified Rankin (Stroke Patients Only)       Balance Overall balance assessment: Needs assistance   Sitting balance-Leahy Scale: Fair     Standing balance support: No upper extremity supported, Bilateral upper extremity supported Standing balance-Leahy Scale: Fair Standing balance comment: pt able to stand without support with tactile cues for balance, bil UE on Rw for gait                             Pertinent Vitals/Pain Pain Assessment Pain Assessment: 0-10 Pain Score: 4  Pain Location: throat Pain Descriptors / Indicators: Sore Pain Intervention(s): Limited activity within patient's tolerance, Monitored during session    Home Living Family/patient expects to be discharged to:: Private residence Living Arrangements: Spouse/significant other;Children Available Help at Discharge: Family;Available PRN/intermittently Type of Home: House Home  Access: Level entry       Home Layout: Two level;Bed/bath upstairs Home Equipment: None      Prior Function Prior  Level of Function : Independent/Modified Independent                     Hand Dominance        Extremity/Trunk Assessment   Upper Extremity Assessment Upper Extremity Assessment: Overall WFL for tasks assessed    Lower Extremity Assessment Lower Extremity Assessment: Overall WFL for tasks assessed    Cervical / Trunk Assessment Cervical / Trunk Assessment: Normal  Communication   Communication: No difficulties  Cognition Arousal/Alertness: Lethargic Behavior During Therapy: Flat affect Overall Cognitive Status: Impaired/Different from baseline Area of Impairment: Orientation, Attention, Memory, Safety/judgement                 Orientation Level: Disoriented to, Time, Situation Current Attention Level: Sustained Memory: Decreased short-term memory   Safety/Judgement: Decreased awareness of safety, Decreased awareness of deficits              General Comments      Exercises     Assessment/Plan    PT Assessment Patient needs continued PT services  PT Problem List Decreased strength;Decreased mobility;Decreased activity tolerance;Decreased cognition;Decreased balance;Decreased coordination;Decreased safety awareness       PT Treatment Interventions Gait training;Therapeutic exercise;Stair training;Balance training;Functional mobility training;Neuromuscular re-education;Patient/family education;DME instruction;Therapeutic activities;Cognitive remediation    PT Goals (Current goals can be found in the Care Plan section)  Acute Rehab PT Goals Patient Stated Goal: return home PT Goal Formulation: With patient Time For Goal Achievement: 07/21/22 Potential to Achieve Goals: Good    Frequency Min 3X/week     Co-evaluation               AM-PAC PT "6 Clicks" Mobility  Outcome Measure Help needed turning from your back to your side while in a flat bed without using bedrails?: A Little Help needed moving from lying on your back to sitting on  the side of a flat bed without using bedrails?: A Little Help needed moving to and from a bed to a chair (including a wheelchair)?: A Little Help needed standing up from a chair using your arms (e.g., wheelchair or bedside chair)?: A Little Help needed to walk in hospital room?: A Lot Help needed climbing 3-5 steps with a railing? : Total 6 Click Score: 15    End of Session Equipment Utilized During Treatment: Gait belt Activity Tolerance: Patient tolerated treatment well Patient left: in chair;with call bell/phone within reach;with chair alarm set Nurse Communication: Mobility status PT Visit Diagnosis: Other abnormalities of gait and mobility (R26.89);History of falling (Z91.81)    Time: 2229-7989 PT Time Calculation (min) (ACUTE ONLY): 23 min   Charges:   PT Evaluation $PT Eval Moderate Complexity: 1 Mod PT Treatments $Therapeutic Activity: 8-22 mins        Merryl Hacker, PT Acute Rehabilitation Services Office: [phone]   Cristine Polio 07/07/2022, 8:38 AM

## 2022-07-07 NOTE — Consult Note (Addendum)
NAME:  GEE KERBEL, MRN:  [id number], DOB:  [birth year], LOS: 0 ADMISSION DATE:  07/06/2022, CONSULTATION DATE:  07/07/22 REFERRING MD:  Erin Hearing CHIEF COMPLAINT:  EtOH withdrawal   History of Present Illness:  Philip [facility]vas is a 41 y.o. male who has a PMH as below including but not limited to EtOH and substance abuse. He presented from [facility] 11/13 for withdrawal symptoms. He had gone there for detox after stopping using opioids roughly 9 days prior (was using 0.5g heroin daily per notes). He had continued to drink 1/5 vodka daily along with occasional beer. Last drink roughly was earlier that morning (1 can beer). He has never had withdrawal requiring hospitalization in the past. Has had seizures as a child but not an adult.  He was admitted by [facility] and was start on Librium and later given Phenobarb 97mg  x 1 along with roughly 13mg Ativan. He then became more altered but continued to have tachycardia and tachypnea with intermittent agitation; therefore, PCCM called in consultation.  Pertinent  Medical History:  has Anxiety state; Erectile dysfunction; HTN (hypertension); ASTHMA, EXERCISE INDUCED; Insomnia; Left knee pain with intra-articular loose body; Chronic epigastric pain; Hypertriglyceridemia; Morbid obesity ([place]); Obstructive sleep apnea; Venous stasis dermatitis of both lower extremities; History of migraine headaches; Male hypogonadism; Alcohol withdrawal ([place]); Nausea & vomiting; Poor social situation; Opioid use disorder; Lactic acidosis; Fatty liver; Hyponatremia; and Hypokalemia on their problem list.  Significant Hospital Events: Including procedures, antibiotic start and stop dates in addition to other pertinent events   11/13 admit. 11/14 transferring to ICU.  Interim History / Subjective:  Tachy to 150, tachypneic to 30's. Somnolent but does open eyes to noxious stimuli and follow basic commands. Has intact cough and gag.  Objective:  Blood pressure 123/84, pulse (!)  149, temperature 99.9 F (37.7 C), temperature source Axillary, resp. rate (!) 39, height 5\' 11"  (1.803 m), weight 117.9 kg, SpO2 96 %.       No intake or output data in the 24 hours ending 07/07/22 0049 Filed Weights   07/06/22 1215  Weight: 117.9 kg    Examination: General: Young adult male, somnolent but appears in withdrawal per vitals and per notes. Neuro: Somnolent but awakens to noxious stimuli and follows basic commands. HEENT: [place]/AT. Sclerae anicteric. EOMI. MM dry. Cardiovascular: Tachy, regular, no M/R/G.  Lungs: Respirations shallow and labored.  CTA bilaterally, No W/R/R. Abdomen: Obese, BS x 4, soft, NT/ND.  Musculoskeletal: No gross deformities, no edema.  Skin: Intact, warm, no rashes.  Assessment & Plan:   Withdrawal - likely combo EtOH + opioids. Notes from H&P state he was previously using 0.5g Heroin daily along with drinking 1/5 vodka daily in addition to occasional beer. Had been given Librium as well as Phenobarb 97mg  x 1 dose in addition to roughly 13mg  Ativan since admit. Hx anxiety, depression. - Given his risk for decompensation, will admit to ICU. - Start Precedex. - Continue PRN Ativan. - At risk for intubation if mental status worsens and gets to point of not protecting airway. He is currently somnolent but does have intact cough and gag. - Thiamine / folate / multivitamin. - TOC consult in AM.  Hypokalemia. Hyponatremia. - 4 runs K. - LR at 75/hr. - Follow BMP.  Hx fatty liver - stable on [place]. - Supportive care. - F/u hepatitis panel.  Hx HTN - non-compliant with meds. - Monitor, might need to restart previous meds (was on Amlodipine and Benicar).   Best practice (evaluated daily):  Diet/type: NPO DVT prophylaxis: LMWH GI prophylaxis: N/A Lines: N/A Foley:  N/A Code Status:  full code Last date of multidisciplinary goals of care discussion: None.  Labs   CBC: Recent Labs  Lab 07/06/22 1232 07/06/22 1333 07/07/22 0004  WBC 9.2   --  5.6  NEUTROABS 8.1*  --   --   HGB 17.0 16.7 13.6  HCT 46.0 49.0 37.1*  MCV 95.2  --  96.6  PLT 146*  --  111*    Basic Metabolic Panel: Recent Labs  Lab 07/06/22 1232 07/06/22 1333  NA 128* 123*  K 3.5 3.3*  CL 87*  --   CO2 16*  --   GLUCOSE 162*  --   BUN 8  --   CREATININE 1.00  --   CALCIUM 9.3  --    GFR: Estimated Creatinine Clearance: 128.2 mL/min (by C-G formula based on SCr of 1 mg/dL). Recent Labs  Lab 07/06/22 1232 07/07/22 0004  WBC 9.2 5.6    Liver Function Tests: Recent Labs  Lab 07/06/22 1232  AST 219*  ALT 179*  ALKPHOS 88  BILITOT 2.8*  PROT 7.2  ALBUMIN 4.1   No results for input(s): "LIPASE", "AMYLASE" in the last 168 hours. No results for input(s): "AMMONIA" in the last 168 hours.  ABG    Component Value Date/Time   HCO3 18.2 (L) 07/06/2022 1333   TCO2 19 (L) 07/06/2022 1333   ACIDBASEDEF 5.0 (H) 07/06/2022 1333   O2SAT 99 07/06/2022 1333     Coagulation Profile: Recent Labs  Lab 07/06/22 1232  INR 1.0    Cardiac Enzymes: Recent Labs  Lab 07/06/22 1232  CKTOTAL 330    HbA1C: Hgb A1c MFr Bld  Date/Time Value Ref Range Status  06/17/2021 12:00 AM 5.2 <5.7 % of total Hgb Final    Comment:    For the purpose of screening for the presence of diabetes: . <5.7%       Consistent with the absence of diabetes 5.7-6.4%    Consistent with increased risk for diabetes             (prediabetes) > or =6.5%  Consistent with diabetes . This assay result is consistent with a decreased risk of diabetes. . Currently, no consensus exists regarding use of hemoglobin A1c for diagnosis of diabetes in children. . According to American Diabetes Association (ADA) guidelines, hemoglobin A1c <7.0% represents optimal control in non-pregnant diabetic patients. Different metrics may apply to specific patient populations.  Standards of Medical Care in Diabetes(ADA). Marland Kitchen   03/02/2017 03:23 PM 5.0 <5.7 % Final    Comment:      For  the purpose of screening for the presence of diabetes:   <5.7%       Consistent with the absence of diabetes 5.7-6.4 %   Consistent with increased risk for diabetes (prediabetes) >=6.5 %     Consistent with diabetes   This assay result is consistent with a decreased risk of diabetes.   Currently, no consensus exists regarding use of hemoglobin A1c for diagnosis of diabetes in children.   According to American Diabetes Association (ADA) guidelines, hemoglobin A1c <7.0% represents optimal control in non-pregnant diabetic patients. Different metrics may apply to specific patient populations. Standards of Medical Care in Diabetes (ADA).       CBG: No results for input(s): "GLUCAP" in the last 168 hours.  Review of Systems:   Unable to obtain as pt is encephalopathic.  Past Medical History:  He,  has a past medical history of Anxiety, Asthma, Depression, Hypertension, and Knee injury.   Surgical History:   Past Surgical History:  Procedure Laterality Date   DENTAL SURGERY     PILONIDAL CYST EXCISION N/A 04/12/2015   Procedure: CYST EXCISION PILONIDAL SIMPLE;  Surgeon: Franky Macho, MD;  Location: [facility];  Service: General;  Laterality: N/A;   TONSILECTOMY, ADENOIDECTOMY, BILATERAL MYRINGOTOMY AND TUBES       Social History:   reports that he has been smoking cigarettes. He has a 1.20 pack-year smoking history. He has never used smokeless tobacco. He reports current alcohol use. He reports current drug use.   Family History:  His family history includes Depression in his father; Hyperlipidemia in his mother; Hypertension in his mother.   Allergies No Known Allergies   Home Medications  Prior to Admission medications   Medication Sig Start Date End Date Taking? Authorizing Provider  albuterol (VENTOLIN HFA) 108 (90 Base) MCG/ACT inhaler Inhale 2 puffs into the lungs every 6 (six) hours as needed. Patient not taking: Reported on 07/06/2022 06/17/21   Monica Becton,  MD  ALPRAZolam Prudy Feeler) 1 MG tablet TAKE 1 TABLET BY MOUTH EVERY DAY Patient not taking: Reported on 07/06/2022 07/28/21   Monica Becton, MD  amLODipine (NORVASC) 5 MG tablet Take 1 tablet (5 mg total) by mouth daily. Patient not taking: Reported on 07/06/2022 06/17/21   Monica Becton, MD  busPIRone (BUSPAR) 5 MG tablet Take 1 tablet (5 mg total) by mouth 3 (three) times daily. Patient not taking: Reported on 07/06/2022 06/17/21   Monica Becton, MD  fenofibrate 160 MG tablet Take 1 tablet (160 mg total) by mouth daily. Patient not taking: Reported on 07/06/2022 06/18/21   Monica Becton, MD  NEEDLE, DISP, 22 G 22G X 1-1/2" MISC Please use to inject testosterone intramuscular every 2 weeks 06/23/21   Monica Becton, MD  olmesartan-hydrochlorothiazide (BENICAR HCT) 40-25 MG tablet Take 1 tablet by mouth daily. Patient not taking: Reported on 07/06/2022 06/17/21   Monica Becton, MD  Syringe/Needle, Disp, (SYRINGE 3CC/18GX1-1/2") 18G X 1-1/2" 3 ML MISC Please use every 2 weeks to draw up testosterone, used 22-gauge needle to inject IM 06/23/21   Monica Becton, MD  tadalafil (CIALIS) 5 MG tablet Take 1 tablet (5 mg total) by mouth daily. Patient not taking: Reported on 07/06/2022 06/17/21   Monica Becton, MD  testosterone cypionate (DEPOTESTOSTERONE CYPIONATE) 200 MG/ML injection Inject 1 mL (200 mg total) into the muscle every 14 (fourteen) days. Patient not taking: Reported on 07/06/2022 06/23/21   Monica Becton, MD  zolpidem (AMBIEN) 10 MG tablet Take 1 tablet (10 mg total) by mouth at bedtime as needed for sleep. Patient not taking: Reported on 07/06/2022 06/17/21   Monica Becton, MD     Critical care time: 35 min.   Rutherford Guys, PA - [facility] For pager details, please see AMION or use Epic chat  After 1900, please call [facility] for cross coverage needs 07/07/2022, 12:49  AM

## 2022-07-07 NOTE — Progress Notes (Signed)
FMTS Interim Progress Note  Called patient's wife to update her on night's events an transfer into the ICU. She is appreciative of our care.   Philip Chard, DO 07/07/2022, 6:58 AM PGY-1, [facility] pager [phone]

## 2022-07-07 NOTE — Progress Notes (Signed)
  Echocardiogram 2D Echocardiogram has been performed.  Philip [facility]vas 07/07/2022, 10:01 AM

## 2022-07-07 NOTE — Progress Notes (Addendum)
FMTS Brief Progress Note  S:Went to patient bedside with Dr. Hyacinth Meeker due to nursing concerns about patient. Patient is still significantly tachycardic and tachypneic but is not responding much to stimulus and so they are unable to do a CIWA score on him at this time.    O: BP 123/84 (BP Location: Right Arm)   Pulse (!) 149   Temp 99.9 F (37.7 C) (Axillary)   Resp (!) 39   Ht 5\' 11"  (1.803 m)   Wt 117.9 kg   SpO2 96%   BMI 36.26 kg/m   General: laying low in the bed/malpositioned, snoring heavily, opens eyes to increased stimulation but not talkative, will move his arms in random movement but not following commands CV: tachycardic to the 150s Respiratory: snoring loudly, tachypneic to the 30s, increased WOB, currently on 3L O2 satting around 88-90%  A/P: Alcohol withdrawal Difficult withdrawal (less than 24h since last drink), patient has gotten a total of 13mg  Ativan since admission, was initially given a librium taper but switched to phenobarb as it did not appear to be doing much for him. His vitals continue to be unstable at this time and patient is now difficult to get responses from. Given the decline and inability for to control his heart rate and breathing we are consulting CCM for further recommendations/management.   12:30 AM Addendum: CCM contact with consult and will see the patient  , DO 07/07/2022, 12:13 AM PGY-3, [facility] Night Resident  Please page [phone] with questions.

## 2022-07-07 NOTE — Progress Notes (Signed)
41 year old male with polysubstance abuse was admitted with alcohol and opiate withdrawal, received multiple doses of IV Ativan, Librium and 1 dose of phenobarbital when he became unresponsive was admitted to ICU  Patient is still tachycardic and tachypneic  Physical exam: General: Acutely ill-appearing obese male, sitting on the couch HEENT: [place]/AT, eyes anicteric.  moist mucus membranes Neuro: Awake, confused, following commands Chest: Tachypneic coarse breath sounds, no wheezes or rhonchi Heart: Tachycardic regular rhythm, no murmurs or gallops Abdomen: Soft, nontender, nondistended, bowel sounds present Skin: No rash   Labs and images were reviewed  Assessment plan: Polysubstance abuse Acute alcohol withdrawal with delirium Acute alcoholic hepatitis Hypertension Hyponatremia Hypokalemia Obesity  Continue phenobarbital withdrawal protocol Stop Precedex Continue CIWA protocol Continue thiamine and folate Continue aggressive IV fluid resuscitation Patient does not meet criteria for steroid for alcoholic hepatitis Monitor electrolytes and supplement Dietitian is following   Total critical care time: 33 minutes  Performed by: Cheri Fowler   Critical care time was exclusive of separately billable procedures and treating other patients.   Critical care was necessary to treat or prevent imminent or life-threatening deterioration.   Critical care was time spent personally by me on the following activities: development of treatment plan with patient and/or surrogate as well as nursing, discussions with consultants, evaluation of patient's response to treatment, examination of patient, obtaining history from patient or surrogate, ordering and performing treatments and interventions, ordering and review of laboratory studies, ordering and review of radiographic studies, pulse oximetry and re-evaluation of patient's condition.   Cheri Fowler, MD [facility] See Amion for pager If no response to pager, please call [phone] until 7pm After 7pm, Please call E-link [phone]

## 2022-07-08 ENCOUNTER — Other Ambulatory Visit: Payer: Self-pay

## 2022-07-08 DIAGNOSIS — F1910 Other psychoactive substance abuse, uncomplicated: Secondary | ICD-10-CM

## 2022-07-08 DIAGNOSIS — F1193 Opioid use, unspecified with withdrawal: Secondary | ICD-10-CM

## 2022-07-08 DIAGNOSIS — E871 Hypo-osmolality and hyponatremia: Secondary | ICD-10-CM

## 2022-07-08 LAB — CBC
HCT: 33.7 % — ABNORMAL LOW (ref 39.0–52.0)
Hemoglobin: 11.8 g/dL — ABNORMAL LOW (ref 13.0–17.0)
MCH: 34.6 pg — ABNORMAL HIGH (ref 26.0–34.0)
MCHC: 35 g/dL (ref 30.0–36.0)
MCV: 98.8 fL (ref 80.0–100.0)
Platelets: 104 10*3/uL — ABNORMAL LOW (ref 150–400)
RBC: 3.41 MIL/uL — ABNORMAL LOW (ref 4.22–5.81)
RDW: 13.6 % (ref 11.5–15.5)
WBC: 4.2 10*3/uL (ref 4.0–10.5)
nRBC: 0.7 % — ABNORMAL HIGH (ref 0.0–0.2)

## 2022-07-08 LAB — BASIC METABOLIC PANEL
Anion gap: 9 (ref 5–15)
BUN: 6 mg/dL (ref 6–20)
CO2: 22 mmol/L (ref 22–32)
Calcium: 8.5 mg/dL — ABNORMAL LOW (ref 8.9–10.3)
Chloride: 102 mmol/L (ref 98–111)
Creatinine, Ser: 0.65 mg/dL (ref 0.61–1.24)
GFR, Estimated: >60 mL/min (ref >60)
Glucose, Bld: 98 mg/dL (ref 70–99)
Potassium: 3.7 mmol/L (ref 3.5–5.1)
Sodium: 133 mmol/L — ABNORMAL LOW (ref 135–145)

## 2022-07-08 LAB — MAGNESIUM: Magnesium: 1.9 mg/dL (ref 1.7–2.4)

## 2022-07-08 MED ORDER — DEXMEDETOMIDINE HCL IN NACL 400 MCG/100ML IV SOLN
0.2000 ug/kg/h | INTRAVENOUS | Status: DC
  Administered 2022-07-08: 0.4 ug/kg/h via INTRAVENOUS
  Filled 2022-07-08: qty 100

## 2022-07-08 MED ORDER — PHENOBARBITAL 32.4 MG PO TABS
64.8000 mg | ORAL_TABLET | Freq: Three times a day (TID) | ORAL | Status: AC
  Administered 2022-07-10 – 2022-07-11 (×6): 64.8 mg via ORAL
  Filled 2022-07-08 (×6): qty 2

## 2022-07-08 MED ORDER — PHENOBARBITAL 32.4 MG PO TABS
32.4000 mg | ORAL_TABLET | Freq: Three times a day (TID) | ORAL | Status: DC
  Administered 2022-07-12 (×2): 32.4 mg via ORAL
  Filled 2022-07-08 (×2): qty 1

## 2022-07-08 MED ORDER — CLONIDINE HCL 0.1 MG PO TABS
0.1000 mg | ORAL_TABLET | Freq: Four times a day (QID) | ORAL | Status: DC
  Administered 2022-07-08 – 2022-07-09 (×4): 0.1 mg via ORAL
  Filled 2022-07-08 (×5): qty 1

## 2022-07-08 MED ORDER — PHENOBARBITAL 32.4 MG PO TABS
97.2000 mg | ORAL_TABLET | Freq: Three times a day (TID) | ORAL | Status: AC
  Administered 2022-07-08 – 2022-07-10 (×6): 97.2 mg via ORAL
  Filled 2022-07-08: qty 3
  Filled 2022-07-08 (×5): qty 1

## 2022-07-08 NOTE — Progress Notes (Signed)
eLink Physician-Brief Progress Note Patient Name: Philip Cuevas DOB: 1981/08/15 MRN: [id number]   Date of Service  07/08/2022  HPI/Events of Note  Polysubstance withdrawal with extreme agitation of  Precedex.  eICU Interventions  Precedex gtt resumed.        Thomasene Lot Chontel Warning 07/08/2022, 2:16 AM

## 2022-07-08 NOTE — TOC Initial Note (Signed)
Transition of Care [facility]) - Initial/Assessment Note    Patient Details  Name: Philip Cuevas MRN: [id number] Date of Birth: 10/19/1980  Transition of Care [facility]) CM/SW Contact:    Tom-Johnson, Hershal Coria, RN Phone Number: 07/08/2022, 9:01 AM  Clinical Narrative:                  CM consulted for home health PT. Patient does not have Aeronautical engineer. Referral sent to [facility] which is agency doing [place] this week and Jerilynn Som declined stating they are not equipped to take patients with withdrawals from ETOH and Opioids. Due to patient not having Medical Insurance, CM will advice Outpatient PT recommendation at this time.  CM will continue to follow as patient continues to progresses with care towards discharge.     Expected Discharge Plan: Home w Home Health Services Barriers to Discharge: Continued Medical Work up   Patient Goals and CMS Choice Patient states their goals for this hospitalization and ongoing recovery are:: To return home CMS Medicare.gov Compare Post Acute Care list provided to:: Patient Choice offered to / list presented to : Patient  Expected Discharge Plan and Services Expected Discharge Plan: Home w Home Health Services   Discharge Planning Services: CM Consult Post Acute Care Choice: Home Health Living arrangements for the past 2 months: Single Family Home                           HH Arranged: PT [facility] Agency: [facility] (Charity-Declined) Date HH Agency Contacted: 07/07/22 Time HH Agency Contacted: 1538 Representative spoke with at [facility] Agency: Jerilynn Som- Declined- Charity. Patient does not have insurance.  Prior Living Arrangements/Services Living arrangements for the past 2 months: Single Family Home   Patient language and need for interpreter reviewed:: Yes Do you feel safe going back to the place where you live?: Yes      Need for Family Participation in Patient Care: Yes (Comment) Care giver support system in place?: Yes (comment)    Criminal Activity/Legal Involvement Pertinent to Current Situation/Hospitalization: No - Comment as needed  Activities of Daily Living      Permission Sought/Granted Permission sought to share information with : Case Manager, Magazine features editor, Family Supports Permission granted to share information with : Yes, Verbal Permission Granted              Emotional Assessment Appearance:: Appears stated age Attitude/Demeanor/Rapport: Engaged, Gracious Affect (typically observed): Accepting, Appropriate, Calm, Hopeful, Pleasant Orientation: : Oriented to Self, Oriented to Place, Oriented to  Time, Oriented to Situation Alcohol / Substance Use: Illicit Drugs Psych Involvement: No (comment)  Admission diagnosis:  Alcohol withdrawal (HCC) [F10.939] Patient Active Problem List   Diagnosis Date Noted   Alcohol withdrawal delirium (HCC) 07/07/2022   Alcohol withdrawal (HCC) 07/06/2022   Nausea & vomiting 07/06/2022   Poor social situation 07/06/2022   Opioid use disorder 07/06/2022   Lactic acidosis 07/06/2022   Fatty liver 07/06/2022   Hyponatremia 07/06/2022   Hypokalemia 07/06/2022   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   History of migraine headaches 08/29/2018   Venous stasis dermatitis of both lower extremities 12/22/2017   Morbid obesity (HCC) 04/30/2017   Obstructive sleep apnea 04/30/2017   Hypertriglyceridemia 03/03/2017   Chronic epigastric pain 03/02/2017   Left knee pain with intra-articular loose body 05/02/2012   HTN (hypertension) 05/07/2008   Erectile dysfunction 07/06/2007   ASTHMA, EXERCISE INDUCED 11/24/2006   Insomnia 06/04/2006   Anxiety state 06/01/2006  PCP:  Monica Becton, MD Pharmacy:   [facility] [address], [place] - [address] [place] [address] Phone: [phone] Fax: [phone]  [facility] [address] [place], [place] - [address] AT [address] [place] [address] Phone: [phone] Fax: [phone]     Social Determinants of Health (SDOH) Interventions    Readmission Risk Interventions     No data to display

## 2022-07-08 NOTE — Consult Note (Addendum)
NAME:  [name], MRN:  [id number], DOB:  03/21/1981, LOS: 1 ADMISSION DATE:  07/06/2022, CONSULTATION DATE:  07/07/22 REFERRING MD:  Deirdre Priest CHIEF COMPLAINT:  EtOH withdrawal   History of Present Illness:  Philip Cuevas is a 41 y.o. male who has a PMH as below including but not limited to EtOH and substance abuse. He presented from [facility] 11/13 for withdrawal symptoms. He had gone there for detox after stopping using opioids roughly 9 days prior (was using 0.5g heroin daily per notes). He had continued to drink 1/5 vodka daily along with occasional beer. Last drink roughly was earlier that morning (1 can beer). He has never had withdrawal requiring hospitalization in the past. Has had seizures as a child but not an adult.  He was admitted by FMTS and was start on Librium and later given Phenobarb 97mg  x 1 along with roughly 13mg Ativan. He then became more altered but continued to have tachycardia and tachypnea with intermittent agitation; therefore, PCCM called in consultation.  Pertinent  Medical History:  has Anxiety state; Erectile dysfunction; HTN (hypertension); ASTHMA, EXERCISE INDUCED; Insomnia; Left knee pain with intra-articular loose body; Chronic epigastric pain; Hypertriglyceridemia; Morbid obesity (HCC); Obstructive sleep apnea; Venous stasis dermatitis of both lower extremities; History of migraine headaches; Male hypogonadism; Alcohol withdrawal (HCC); Nausea & vomiting; Poor social situation; Opioid use disorder; Lactic acidosis; Fatty liver; Hyponatremia; Hypokalemia; and Alcohol withdrawal delirium (HCC) on their problem list.  Significant Hospital Events: Including procedures, antibiotic start and stop dates in addition to other pertinent events   11/13 admit. 11/14 transferring to ICU.  Interim History / Subjective:  Came agitated overnight, restarted back on Precedex He is hypertensive and tachycardic  Objective:  Blood pressure (!) 150/113, pulse 100,  temperature 98.8 F (37.1 C), temperature source Oral, resp. rate (!) 41, height 5\' 11"  (1.803 m), weight 117.9 kg, SpO2 97 %.        Intake/Output Summary (Last 24 hours) at 07/08/2022 0802 Last data filed at 07/08/2022 0700 Gross per 24 hour  Intake 3332.82 ml  Output 3000 ml  Net 332.82 ml   Filed Weights   07/06/22 1215  Weight: 117.9 kg    Examination: Physical exam: General: Acutely ill-appearing obesemale, lying on the bed HEENT: [place]/AT, eyes anicteric.  moist mucus membranes Neuro: Lethargic, Opens eyes to vocal stimuli, following simple commands, moving all 4 extremities Chest: Tachypneic coarse breath sounds, no wheezes or rhonchi Heart: Tachycardic with regular rhythm, no murmurs or gallops Abdomen: Soft, nontender, nondistended, bowel sounds present Skin: No rash   Assessment & Plan:  Polysubstance abuse Alcohol and opiate withdrawal with hyperactive delirium Acute alcoholic hepatitis Hypertension Hyponatremia Hypokalemia, improved Obesity Fatty liver disease Probable sleep apnea  Patient is dependent on opiate, alcohol and he is active tobacco dependent  Currently in opiate and alcohol withdrawal Continue phenobarbital alcohol withdrawal protocol with higher dose Stop Precedex Increase clonidine to 0.1 mg 4 times daily Encourage sleep hygiene by keeping him awake during daytime leading to sleep at night LFTs are improving Patient did not meet criteria for steroids for alcoholic hepatitis considering discontinuing factor was less than 32 Remain hypertensive and tachycardic Serum sodium is improving now at 133 Serum potassium is 3.7 Continue IV fluid Continue thiamine, folate and multivitamin Patient snores during sleep and has insomnia, he needs outpatient sleep studies  Best practice (evaluated daily):  Diet/type: Regular diet DVT prophylaxis: LMWH GI prophylaxis: N/A Lines: N/A Foley:  N/A Code Status:  full code Last date  of multidisciplinary  goals of care discussion: 11/14: Patient wife was updated at bedside  Labs   CBC: Recent Labs  Lab 07/06/22 1232 07/06/22 1333 07/07/22 0004 07/08/22 0337  WBC 9.2  --  5.6 4.2  NEUTROABS 8.1*  --   --   --   HGB 17.0 16.7 13.6 11.8*  HCT 46.0 49.0 37.1* 33.7*  MCV 95.2  --  96.6 98.8  PLT 146*  --  111* 104*    Basic Metabolic Panel: Recent Labs  Lab 07/06/22 1232 07/06/22 1333 07/07/22 0004 07/08/22 0337  NA 128* 123* 127* 133*  K 3.5 3.3* 3.0* 3.7  CL 87*  --  93* 102  CO2 16*  --  24 22  GLUCOSE 162*  --  101* 98  BUN 8  --  10 6  CREATININE 1.00  --  0.71 0.65  CALCIUM 9.3  --  8.4* 8.5*  MG  --   --  1.9 1.9   GFR: Estimated Creatinine Clearance: 160.2 mL/min (by C-G formula based on SCr of 0.65 mg/dL). Recent Labs  Lab 07/06/22 1232 07/07/22 0004 07/08/22 0337  WBC 9.2 5.6 4.2    Liver Function Tests: Recent Labs  Lab 07/06/22 1232 07/07/22 0004  AST 219* 190*  ALT 179* 132*  ALKPHOS 88 60  BILITOT 2.8* 2.7*  PROT 7.2 5.6*  ALBUMIN 4.1 3.1*   No results for input(s): "LIPASE", "AMYLASE" in the last 168 hours. No results for input(s): "AMMONIA" in the last 168 hours.  ABG    Component Value Date/Time   HCO3 18.2 (L) 07/06/2022 1333   TCO2 19 (L) 07/06/2022 1333   ACIDBASEDEF 5.0 (H) 07/06/2022 1333   O2SAT 99 07/06/2022 1333     Coagulation Profile: Recent Labs  Lab 07/06/22 1232  INR 1.0    Cardiac Enzymes: Recent Labs  Lab 07/06/22 1232  CKTOTAL 330    HbA1C: Hgb A1c MFr Bld  Date/Time Value Ref Range Status  06/17/2021 12:00 AM 5.2 <5.7 % of total Hgb Final    Comment:    For the purpose of screening for the presence of diabetes: . <5.7%       Consistent with the absence of diabetes 5.7-6.4%    Consistent with increased risk for diabetes             (prediabetes) > or =6.5%  Consistent with diabetes . This assay result is consistent with a decreased risk of diabetes. . Currently, no consensus exists regarding  use of hemoglobin A1c for diagnosis of diabetes in children. . According to American Diabetes Association (ADA) guidelines, hemoglobin A1c <7.0% represents optimal control in non-pregnant diabetic patients. Different metrics may apply to specific patient populations.  Standards of Medical Care in Diabetes(ADA). Marland Kitchen   03/02/2017 03:23 PM 5.0 <5.7 % Final    Comment:      For the purpose of screening for the presence of diabetes:   <5.7%       Consistent with the absence of diabetes 5.7-6.4 %   Consistent with increased risk for diabetes (prediabetes) >=6.5 %     Consistent with diabetes   This assay result is consistent with a decreased risk of diabetes.   Currently, no consensus exists regarding use of hemoglobin A1c for diagnosis of diabetes in children.   According to American Diabetes Association (ADA) guidelines, hemoglobin A1c <7.0% represents optimal control in non-pregnant diabetic patients. Different metrics may apply to specific patient populations. Standards of Medical Care in Diabetes (ADA).  CBG: Recent Labs  Lab 07/07/22 0817 07/07/22 1141 07/07/22 1556 07/07/22 1927 07/07/22 2307  GLUCAP 104* 89 120* 111* 116*    Total critical care time: 39 minutes  Performed by: Cheri Fowler   Critical care time was exclusive of separately billable procedures and treating other patients.   Critical care was necessary to treat or prevent imminent or life-threatening deterioration.   Critical care was time spent personally by me on the following activities: development of treatment plan with patient and/or surrogate as well as nursing, discussions with consultants, evaluation of patient's response to treatment, examination of patient, obtaining history from patient or surrogate, ordering and performing treatments and interventions, ordering and review of laboratory studies, ordering and review of radiographic studies, pulse oximetry and re-evaluation of patient's  condition.   Cheri Fowler, MD [facility] See Amion for pager If no response to pager, please call [phone] until 7pm After 7pm, Please call E-link [phone]

## 2022-07-08 NOTE — Progress Notes (Addendum)
Spoke with PT and Tidus Upchurch (wife) and they expressed that they wanted information only to be given to PT, Mrs. Tieu and Shonna Chock [facility]).   They did set up a password to be used "[place]".

## 2022-07-08 NOTE — Evaluation (Signed)
Occupational Therapy Evaluation Patient Details Name: Philip Cuevas MRN: [id number] DOB: 19-Mar-1981 Today's Date: 07/08/2022   History of Present Illness 41 yo male admitted 11/13 with ETOH and heroin withdrawal. PMHx: polysubstance abuse, asthma, insomnia, HLD, obesity, OSA   Clinical Impression   Pt typically independent in ADL and mobility. Today therapy limited by HR (120-135) and hypertension (150/113) but agreeable to chair and grooming tasks in chair. Pt min guard for bed mobility, min A to don socks EOB, min A for stand pivot transfer for balance to recliner. Set up for grooming tasks in the chair. Pt with noted minor tremors in BUE consistent with withdrawal. Pt will benefit from skilled OT in the acute setting to maximize safety and independence in ADL and functional transfers. Do not anticipate post-acute OT needs. All of this communicated to Pt and he is in agreement. Next session, as HR allows plan for standing grooming tasks to test more fine motor (opening containers, manage buttons etc)     Recommendations for follow up therapy are one component of a multi-disciplinary discharge planning process, led by the attending physician.  Recommendations may be updated based on patient status, additional functional criteria and insurance authorization.   Follow Up Recommendations  No OT follow up     Assistance Recommended at Discharge Intermittent Supervision/Assistance  Patient can return home with the following A little help with walking and/or transfers;A little help with bathing/dressing/bathroom;Assistance with cooking/housework;Direct supervision/assist for medications management;Assist for transportation;Help with stairs or ramp for entrance    Functional Status Assessment  Patient has had a recent decline in their functional status and demonstrates the ability to make significant improvements in function in a reasonable and predictable amount of time.  Equipment  Recommendations  None recommended by OT (Pt has appropriate DME)    Recommendations for Other Services PT consult     Precautions / Restrictions Precautions Precautions: Fall;Other (comment) Precaution Comments: watch HR Restrictions Weight Bearing Restrictions: No      Mobility Bed Mobility Overal bed mobility: Needs Assistance Bed Mobility: Supine to Sit     Supine to sit: Min guard     General bed mobility comments: guarding for lines and safety    Transfers Overall transfer level: Needs assistance Equipment used: 1 person hand held assist Transfers: Sit to/from Stand, Bed to chair/wheelchair/BSC Sit to Stand: Min assist Stand pivot transfers: Min assist         General transfer comment: min assist to stand from bed with cues for safety and assist for balance HHA for balance, HR up from 120-135 with stand pivot      Balance Overall balance assessment: Needs assistance   Sitting balance-Leahy Scale: Fair     Standing balance support: No upper extremity supported, Bilateral upper extremity supported Standing balance-Leahy Scale: Fair                             ADL either performed or assessed with clinical judgement   ADL Overall ADL's : Needs assistance/impaired Eating/Feeding: Set up   Grooming: Wash/dry face;Oral care;Set up;Sitting Grooming Details (indicate cue type and reason): will need to assess ability to open containers Upper Body Bathing: Minimal assistance   Lower Body Bathing: Minimal assistance   Upper Body Dressing : Minimal assistance   Lower Body Dressing: Minimal assistance;Sitting/lateral leans Lower Body Dressing Details (indicate cue type and reason): donning socks for balance EOB, able to reach Toilet Transfer: Minimal assistance;Stand-pivot Statistician  Details (indicate cue type and reason): simualted through recliner transfer Toileting- Clothing Manipulation and Hygiene: Minimal assistance   Tub/ Shower  Transfer: Minimal assistance   Functional mobility during ADLs: Minimal assistance (1 person HHA SPT only) General ADL Comments: limited mobility due to HR, decreased cognition,     Vision Ability to See in Adequate Light: 0 Adequate Patient Visual Report: No change from baseline       Perception     Praxis      Pertinent Vitals/Pain Pain Assessment Pain Assessment: Faces Faces Pain Scale: Hurts a little bit Pain Location: generalized Pain Descriptors / Indicators: Discomfort Pain Intervention(s): Limited activity within patient's tolerance, Monitored during session, Repositioned     Hand Dominance Right   Extremity/Trunk Assessment Upper Extremity Assessment Upper Extremity Assessment: Generalized weakness (minor tremors present, able to self-feed and perform grooming tasks)   Lower Extremity Assessment Lower Extremity Assessment: Defer to PT evaluation (Pt reports that [place] has no feeling (old injury))   Cervical / Trunk Assessment Cervical / Trunk Assessment: Normal   Communication Communication Communication: No difficulties   Cognition Arousal/Alertness: Lethargic Behavior During Therapy: Flat affect Overall Cognitive Status: Impaired/Different from baseline Area of Impairment: Attention, Memory, Safety/judgement, Problem solving                   Current Attention Level: Sustained Memory: Decreased short-term memory   Safety/Judgement: Decreased awareness of safety, Decreased awareness of deficits   Problem Solving: Requires verbal cues General Comments: Pt overall cooperative, decreased awareness of self and safety, tangential conversation     General Comments  Pt with SpO2 >93% on RA throughout session    Exercises     Shoulder Instructions      Home Living Family/patient expects to be discharged to:: Private residence Living Arrangements: Spouse/significant other;Children Available Help at Discharge: Family;Available  PRN/intermittently Type of Home: House Home Access: Level entry     Home Layout: Two level;Bed/bath upstairs     Bathroom Shower/Tub: Producer, television/film/video: Standard     Home Equipment: Grab bars - tub/shower;Hand held shower head          Prior Functioning/Environment Prior Level of Function : Independent/Modified Independent                        OT Problem List: Decreased strength;Decreased activity tolerance;Impaired balance (sitting and/or standing);Decreased coordination;Decreased cognition;Decreased safety awareness;Cardiopulmonary status limiting activity;Obesity      OT Treatment/Interventions:      OT Goals(Current goals can be found in the care plan section) Acute Rehab OT Goals Patient Stated Goal: get clean OT Goal Formulation: With patient Time For Goal Achievement: 07/22/22 Potential to Achieve Goals: Good ADL Goals Pt Will Perform Grooming: with modified independence;standing Pt Will Perform Upper Body Dressing: with modified independence Pt Will Perform Lower Body Dressing: with modified independence;sit to/from stand Pt Will Transfer to Toilet: with modified independence;ambulating Pt Will Perform Toileting - Clothing Manipulation and hygiene: with modified independence;sit to/from stand  OT Frequency:      Co-evaluation              AM-PAC OT "6 Clicks" Daily Activity     Outcome Measure                 End of Session Equipment Utilized During Treatment: Gait belt Nurse Communication: Mobility status;Other (comment) (no chair alarm)  Activity Tolerance: Patient tolerated treatment well;Other (comment) (limited by HR) Patient left: in chair;with  call bell/phone within reach (full recliner, Pt verbally acknowledging need assist to get up)  OT Visit Diagnosis: Unsteadiness on feet (R26.81);Other abnormalities of gait and mobility (R26.89);Muscle weakness (generalized) (M62.81);Other symptoms and signs involving the  nervous system (R29.898)                Time: 5300-5110 OT Time Calculation (min): 24 min Charges:  OT General Charges $OT Visit: 1 Visit OT Evaluation $OT Eval Moderate Complexity: 1 Mod  Nyoka Cowden OTR/L Acute Rehabilitation Services Office: [phone]  Emelda Fear 07/08/2022, 9:58 AM

## 2022-07-09 DIAGNOSIS — E860 Dehydration: Secondary | ICD-10-CM

## 2022-07-09 MED ORDER — CLONIDINE HCL 0.1 MG PO TABS
0.1000 mg | ORAL_TABLET | Freq: Two times a day (BID) | ORAL | Status: DC
  Administered 2022-07-09 – 2022-07-11 (×5): 0.1 mg via ORAL
  Filled 2022-07-09 (×4): qty 1

## 2022-07-09 MED ORDER — MELATONIN 3 MG PO TABS
3.0000 mg | ORAL_TABLET | Freq: Every evening | ORAL | Status: DC | PRN
  Administered 2022-07-09 – 2022-07-11 (×4): 3 mg via ORAL
  Filled 2022-07-09 (×4): qty 1

## 2022-07-09 MED ORDER — LABETALOL HCL 200 MG PO TABS
200.0000 mg | ORAL_TABLET | Freq: Two times a day (BID) | ORAL | Status: DC
  Administered 2022-07-09 – 2022-07-11 (×5): 200 mg via ORAL
  Filled 2022-07-09 (×5): qty 1

## 2022-07-09 NOTE — Progress Notes (Signed)
eLink Physician-Brief Progress Note Patient Name: JOSHUAH MINELLA DOB: 1981-02-04 MRN: [id number]   Date of Service  07/09/2022  HPI/Events of Note  Patient request sleep aid. Atarax and Ativan not effective.   eICU Interventions  Plan: Melatonin 3 mg PO Q HS PRN Sleep.     Intervention Category Major Interventions: Other:  Lenell Antu 07/09/2022, 1:59 AM

## 2022-07-09 NOTE — Progress Notes (Signed)
Physical Therapy Treatment Patient Details Name: ANSELMO REIHL MRN: [id number] DOB: 04/01/1981 Today's Date: 07/09/2022   History of Present Illness 41 yo male admitted 11/13 with ETOH and heroin withdrawal. PMHx: polysubstance abuse, asthma, insomnia, HLD, obesity, OSA    PT Comments    Pt eager to mobilize OOB upon PT arrival. Pt ambulatory around the unit without AD, but requires intermittent steadying assist given mild ataxic gait and periods of dizziness. Pt tachycardic with gait, resolves with rest and pt states he feels he is improving functionally vs on admission. PT to continue to follow.       Recommendations for follow up therapy are one component of a multi-disciplinary discharge planning process, led by the attending physician.  Recommendations may be updated based on patient status, additional functional criteria and insurance authorization.  Follow Up Recommendations  Home health PT     Assistance Recommended at Discharge Frequent or constant Supervision/Assistance  Patient can return home with the following A little help with walking and/or transfers;A little help with bathing/dressing/bathroom;Assistance with cooking/housework;Assist for transportation;Direct supervision/assist for medications management;Direct supervision/assist for financial management;Help with stairs or ramp for entrance   Equipment Recommendations  Rolling walker (2 wheels)    Recommendations for Other Services       Precautions / Restrictions Precautions Precautions: Fall;Other (comment) Precaution Comments: watch HR Restrictions Weight Bearing Restrictions: No     Mobility  Bed Mobility Overal bed mobility: Needs Assistance Bed Mobility: Supine to Sit     Supine to sit: Min assist     General bed mobility comments: assist for completion of trunk rise    Transfers Overall transfer level: Needs assistance Equipment used: 1 person hand held assist Transfers: Sit to/from  Stand, Bed to chair/wheelchair/BSC Sit to Stand: Min assist           General transfer comment: rise and steady assist    Ambulation/Gait Ambulation/Gait assistance: Min assist Gait Distance (Feet): 150 Feet Assistive device: None Gait Pattern/deviations: Step-through pattern, Decreased stride length, Ataxic, Wide base of support Gait velocity: decr     General Gait Details: light steadying assist especially initially, wide BOS and unsteadiness noted   Stairs             Wheelchair Mobility    Modified Rankin (Stroke Patients Only)       Balance Overall balance assessment: Needs assistance   Sitting balance-Leahy Scale: Fair     Standing balance support: No upper extremity supported, Bilateral upper extremity supported Standing balance-Leahy Scale: Fair                              Cognition Arousal/Alertness: Awake/alert Behavior During Therapy: Flat affect Overall Cognitive Status: Impaired/Different from baseline Area of Impairment: Safety/judgement, Problem solving, Following commands                       Following Commands: Follows one step commands with increased time Safety/Judgement: Decreased awareness of safety, Decreased awareness of deficits   Problem Solving: Requires verbal cues General Comments: sarcastic sense of humor        Exercises      General Comments General comments (skin integrity, edema, etc.): HRmax 130s      Pertinent Vitals/Pain Pain Assessment Pain Assessment: Faces Faces Pain Scale: Hurts little more Pain Location: feet (neuropathy) Pain Descriptors / Indicators: Discomfort Pain Intervention(s): Limited activity within patient's tolerance, Monitored during session, Repositioned  Home Living                          Prior Function            PT Goals (current goals can now be found in the care plan section) Acute Rehab PT Goals Patient Stated Goal: return home PT Goal  Formulation: With patient Time For Goal Achievement: 07/21/22 Potential to Achieve Goals: Good Progress towards PT goals: Progressing toward goals    Frequency    Min 3X/week      PT Plan Current plan remains appropriate    Co-evaluation              AM-PAC PT "6 Clicks" Mobility   Outcome Measure  Help needed turning from your back to your side while in a flat bed without using bedrails?: A Little Help needed moving from lying on your back to sitting on the side of a flat bed without using bedrails?: A Little Help needed moving to and from a bed to a chair (including a wheelchair)?: A Little Help needed standing up from a chair using your arms (e.g., wheelchair or bedside chair)?: A Little Help needed to walk in hospital room?: A Little Help needed climbing 3-5 steps with a railing? : A Lot 6 Click Score: 17    End of Session Equipment Utilized During Treatment: Gait belt Activity Tolerance: Patient tolerated treatment well Patient left: in chair;with call bell/phone within reach;with chair alarm set;with family/visitor present Nurse Communication: Mobility status PT Visit Diagnosis: Other abnormalities of gait and mobility (R26.89);History of falling (Z91.81)     Time: 1433-1450 PT Time Calculation (min) (ACUTE ONLY): 17 min  Charges:  $Gait Training: 8-22 mins                     Marye Round, PT DPT Acute Rehabilitation Services Pager [phone]  Office [phone]    Tyrone Apple E Christain [place] 07/09/2022, 5:06 PM

## 2022-07-09 NOTE — Hospital Course (Addendum)
Philip Cuevas is a 41 y.o. male presenting from [facility] for medical management of alcohol and opioid withdrawal symptoms for past 9 days. Past medical history is significant for HTN, anxiety, depression, asthma, ED, obesity, alcohol use disorder, opioid use disorder. His hospital course is outlined below:   Opiate Withdrawal:  Patient presented to the ED 9 days out from opioid withdrawal. Previously using 0.5 g heroin/day. He experienced N/V, tremors, and diarrhea. He compensated by drinking more alcohol. COWS score in the ED was negative. Initially started on Suboxone, but patient declined further treatment with the medication.  Alcohol Withdrawal:  As stated above, increased alcohol consumption during opiate withdrawal. Currently drinking 1/5 vodka daily with beer. On presentation he was tremulous, nauseated, SOB. VS significant for tachycardia to the 130s, tachypnea, and hypertension. He was started on PRN Ativan and received 13 mg before adding Librium taper and eventually phenobarbital. He remained agitated with elevated CIWAs >20 on the previously listed meds. CCM was consulted and he was transferred to the ICU. He spent 3 days in the ICU with intermittent use of Precedex and continued phenobarbital taper. MEWS remained elevated >5 due to tachycardia and tachypnea. He was transferred by the FMTS on phenobarbital taper once his vital signs stabilized. CIWAs remained stable.   HTN:  Started on clonidine 0.1 mg BID. Pressures remained stable. Tolerated wean well. Started on Amlodipine 5 mg daily.     PCP follow up:  Likely sleep apnea, will need sleep study outpatient.  Alcohol use cessation Opioid use cessation

## 2022-07-09 NOTE — Progress Notes (Signed)
NAME:  Philip Cuevas, MRN:  [id number], DOB:  [birth year], LOS: 2 ADMISSION DATE:  07/06/2022, CONSULTATION DATE:  07/07/22 REFERRING MD:  Deirdre Priest CHIEF COMPLAINT:  EtOH withdrawal   History of Present Illness:  Philip Cuevas is a 41 y.o. male who has a PMH as below including but not limited to EtOH and substance abuse. He presented from [facility] 11/13 for withdrawal symptoms. He had gone there for detox after stopping using opioids roughly 9 days prior (was using 0.5g heroin daily per notes). He had continued to drink 1/5 vodka daily along with occasional beer. Last drink roughly was earlier that morning (1 can beer). He has never had withdrawal requiring hospitalization in the past. Has had seizures as a child but not an adult.  He was admitted by FMTS and was start on Librium and later given Phenobarb 97mg  x 1 along with roughly 13mg Ativan. He then became more altered but continued to have tachycardia and tachypnea with intermittent agitation; therefore, PCCM called in consultation.  Pertinent  Medical History:  has Anxiety state; Erectile dysfunction; HTN (hypertension); ASTHMA, EXERCISE INDUCED; Insomnia; Left knee pain with intra-articular loose body; Chronic epigastric pain; Hypertriglyceridemia; Morbid obesity (HCC); Obstructive sleep apnea; Venous stasis dermatitis of both lower extremities; History of migraine headaches; Male hypogonadism; Alcohol withdrawal (HCC); Nausea & vomiting; Poor social situation; Opioid use disorder; Lactic acidosis; Fatty liver; Hyponatremia; Hypokalemia; and Alcohol withdrawal delirium (HCC) on their problem list.  Significant Hospital Events: Including procedures, antibiotic start and stop dates in addition to other pertinent events   11/13 admit. 11/14 transferring to ICU.  Interim History / Subjective:  No overnight issues except patient stated that he could not sleep last night requiring melatonin did not help Did not require Precedex for more  than 24 hours now  Objective:  Blood pressure (!) 140/101, pulse (!) 105, temperature 99.3 F (37.4 C), temperature source Oral, resp. rate (!) 28, height 5\' 11"  (1.803 m), weight 117.9 kg, SpO2 96 %.        Intake/Output Summary (Last 24 hours) at 07/09/2022 12/14 Last data filed at 07/09/2022 0600 Gross per 24 hour  Intake 1772.38 ml  Output 4375 ml  Net -2602.62 ml   Filed Weights   07/06/22 1215  Weight: 117.9 kg    Examination: Physical exam: General: Acutely ill-appearing obese male, lying on the bed HEENT: [place]/AT, eyes anicteric.  moist mucus membranes Neuro: Alert, awake, following commands, moving all 4 extremities Chest: Tachypneic coarse breath sounds, no wheezes or rhonchi Heart: Tachycardic with regular rhythm, no murmurs or gallops Abdomen: Soft, nontender, nondistended, bowel sounds present Skin: No rash   Assessment & Plan:  Polysubstance abuse Alcohol and opiate withdrawal with hyperactive delirium Acute alcoholic hepatitis Hypertension, uncontrolled Hyponatremia Hypokalemia, improved Obesity Fatty liver disease Probable sleep apnea  Patient is dependent on opiate, alcohol and he is active tobacco dependent  Currently in opiate and alcohol withdrawal Continue phenobarbital alcohol withdrawal protocol Continue clonidine 0.1 mg twice daily Monitor CIWA scale Labetalol added for uncontrolled hypertension with tachycardia Encourage sleep hygiene by keeping him awake during daytime leading to sleep at night LFTs are improving Patient did not meet criteria for steroids for alcoholic hepatitis considering discontinuing factor was less than 32 Serum sodium is improving now at 133 Serum potassium is 3.7 Stop IV fluid Continue thiamine, folate and multivitamin Patient snores during sleep and has insomnia, he needs outpatient sleep studies  Best practice (evaluated daily):  Diet/type: Regular diet DVT prophylaxis: LMWH GI prophylaxis:  N/A Lines:  N/A Foley:  N/A Code Status:  full code Last date of multidisciplinary goals of care discussion: 11/14: Patient wife was updated at bedside  Labs   CBC: Recent Labs  Lab 07/06/22 1232 07/06/22 1333 07/07/22 0004 07/08/22 0337  WBC 9.2  --  5.6 4.2  NEUTROABS 8.1*  --   --   --   HGB 17.0 16.7 13.6 11.8*  HCT 46.0 49.0 37.1* 33.7*  MCV 95.2  --  96.6 98.8  PLT 146*  --  111* 104*    Basic Metabolic Panel: Recent Labs  Lab 07/06/22 1232 07/06/22 1333 07/07/22 0004 07/08/22 0337  NA 128* 123* 127* 133*  K 3.5 3.3* 3.0* 3.7  CL 87*  --  93* 102  CO2 16*  --  24 22  GLUCOSE 162*  --  101* 98  BUN 8  --  10 6  CREATININE 1.00  --  0.71 0.65  CALCIUM 9.3  --  8.4* 8.5*  MG  --   --  1.9 1.9   GFR: Estimated Creatinine Clearance: 160.2 mL/min (by C-G formula based on SCr of 0.65 mg/dL). Recent Labs  Lab 07/06/22 1232 07/07/22 0004 07/08/22 0337  WBC 9.2 5.6 4.2    Liver Function Tests: Recent Labs  Lab 07/06/22 1232 07/07/22 0004  AST 219* 190*  ALT 179* 132*  ALKPHOS 88 60  BILITOT 2.8* 2.7*  PROT 7.2 5.6*  ALBUMIN 4.1 3.1*   No results for input(s): "LIPASE", "AMYLASE" in the last 168 hours. No results for input(s): "AMMONIA" in the last 168 hours.  ABG    Component Value Date/Time   HCO3 18.2 (L) 07/06/2022 1333   TCO2 19 (L) 07/06/2022 1333   ACIDBASEDEF 5.0 (H) 07/06/2022 1333   O2SAT 99 07/06/2022 1333     Coagulation Profile: Recent Labs  Lab 07/06/22 1232  INR 1.0    Cardiac Enzymes: Recent Labs  Lab 07/06/22 1232  CKTOTAL 330    HbA1C: Hgb A1c MFr Bld  Date/Time Value Ref Range Status  06/17/2021 12:00 AM 5.2 <5.7 % of total Hgb Final    Comment:    For the purpose of screening for the presence of diabetes: . <5.7%       Consistent with the absence of diabetes 5.7-6.4%    Consistent with increased risk for diabetes             (prediabetes) > or =6.5%  Consistent with diabetes . This assay result is consistent with a  decreased risk of diabetes. . Currently, no consensus exists regarding use of hemoglobin A1c for diagnosis of diabetes in children. . According to American Diabetes Association (ADA) guidelines, hemoglobin A1c <7.0% represents optimal control in non-pregnant diabetic patients. Different metrics may apply to specific patient populations.  Standards of Medical Care in Diabetes(ADA). Marland Kitchen   03/02/2017 03:23 PM 5.0 <5.7 % Final    Comment:      For the purpose of screening for the presence of diabetes:   <5.7%       Consistent with the absence of diabetes 5.7-6.4 %   Consistent with increased risk for diabetes (prediabetes) >=6.5 %     Consistent with diabetes   This assay result is consistent with a decreased risk of diabetes.   Currently, no consensus exists regarding use of hemoglobin A1c for diagnosis of diabetes in children.   According to American Diabetes Association (ADA) guidelines, hemoglobin A1c <7.0% represents optimal control in non-pregnant diabetic patients. Different metrics  may apply to specific patient populations. Standards of Medical Care in Diabetes (ADA).       CBG: Recent Labs  Lab 07/07/22 0817 07/07/22 1141 07/07/22 1556 07/07/22 1927 07/07/22 2307  GLUCAP 104* 89 120* 111* 116*      Cheri Fowler, MD [facility] See Amion for pager If no response to pager, please call [phone] until 7pm After 7pm, Please call E-link [phone]

## 2022-07-10 DIAGNOSIS — F1910 Other psychoactive substance abuse, uncomplicated: Principal | ICD-10-CM

## 2022-07-10 HISTORY — DX: Other psychoactive substance abuse, uncomplicated: F19.10

## 2022-07-10 LAB — CBC
HCT: 36.3 % — ABNORMAL LOW (ref 39.0–52.0)
Hemoglobin: 13.1 g/dL (ref 13.0–17.0)
MCH: 36.4 pg — ABNORMAL HIGH (ref 26.0–34.0)
MCHC: 36.1 g/dL — ABNORMAL HIGH (ref 30.0–36.0)
MCV: 100.8 fL — ABNORMAL HIGH (ref 80.0–100.0)
Platelets: 194 10*3/uL (ref 150–400)
RBC: 3.6 MIL/uL — ABNORMAL LOW (ref 4.22–5.81)
RDW: 14.8 % (ref 11.5–15.5)
WBC: 4.6 10*3/uL (ref 4.0–10.5)
nRBC: 0 % (ref 0.0–0.2)

## 2022-07-10 LAB — COMPREHENSIVE METABOLIC PANEL
ALT: 101 U/L — ABNORMAL HIGH (ref 0–44)
AST: 90 U/L — ABNORMAL HIGH (ref 15–41)
Albumin: 2.9 g/dL — ABNORMAL LOW (ref 3.5–5.0)
Alkaline Phosphatase: 68 U/L (ref 38–126)
Anion gap: 12 (ref 5–15)
BUN: 10 mg/dL (ref 6–20)
CO2: 20 mmol/L — ABNORMAL LOW (ref 22–32)
Calcium: 8.8 mg/dL — ABNORMAL LOW (ref 8.9–10.3)
Chloride: 105 mmol/L (ref 98–111)
Creatinine, Ser: 0.73 mg/dL (ref 0.61–1.24)
GFR, Estimated: >60 mL/min (ref >60)
Glucose, Bld: 163 mg/dL — ABNORMAL HIGH (ref 70–99)
Potassium: 3.7 mmol/L (ref 3.5–5.1)
Sodium: 137 mmol/L (ref 135–145)
Total Bilirubin: 1.1 mg/dL (ref 0.3–1.2)
Total Protein: 5.6 g/dL — ABNORMAL LOW (ref 6.5–8.1)

## 2022-07-10 LAB — GLUCOSE, CAPILLARY: Glucose-Capillary: 105 mg/dL — ABNORMAL HIGH (ref 70–99)

## 2022-07-10 MED ORDER — BUPRENORPHINE HCL-NALOXONE HCL 8-2 MG SL SUBL
1.0000 | SUBLINGUAL_TABLET | Freq: Two times a day (BID) | SUBLINGUAL | Status: DC
  Administered 2022-07-11 (×2): 1 via SUBLINGUAL
  Filled 2022-07-10 (×2): qty 1

## 2022-07-10 MED ORDER — BUPRENORPHINE HCL-NALOXONE HCL 2-0.5 MG SL SUBL
2.0000 | SUBLINGUAL_TABLET | SUBLINGUAL | Status: AC | PRN
  Administered 2022-07-10: 2 via SUBLINGUAL
  Filled 2022-07-10 (×2): qty 2

## 2022-07-10 MED ORDER — LACTATED RINGERS IV SOLN: INTRAVENOUS | Status: AC

## 2022-07-10 MED ORDER — PHENOL 1.4 % MT LIQD: 1.0000 | OROMUCOSAL | Status: DC | PRN

## 2022-07-10 NOTE — Progress Notes (Addendum)
Daily Progress Note Intern Pager: [phone]  Patient name: Philip Cuevas Medical record number: [id number] Date of birth: Dec 18, 1980 Age: 41 y.o. Gender: male  Primary Care Provider: Silverio Decamp, MD Consultants: None Code Status: Full  Pt Overview and Major Events to Date:  11/13 Admitted to FMTS 11/14 Transition to ICU 11/17 Return to FMTS  Assessment and Plan: Philip Cuevas is 41yo M admitted for alcohol and opioid withdrawal. Clinically improving and has transitioned from ICU back to FMTS today.   Pertinent PMH/PSH includes alcohol use disorder, opioid use disorder, asthma.  * Alcohol withdrawal ([place]) Transitioned back out of ICU today. Now on phenobarb taper and CIWAs 4-5 ON. Overall improved since admission and I expect him to be nearing the end of his alcohol withdrawal period. - CIWAs w/ prn ativan. Consider holding prn ativan if CIWA remains stable - Consider medical therapy for alcohol cessation. - Phenobarb taper - daily thiamine, folate, multivit - Seizure precaution   Opioid use disorder Interested in MAT for management.  - Start Suboxone 4mg  BID sch - COWs w/ Suboxone prn - Will need outpt follow-up for suboxone management (potential [facility]) - Clonidine 0.1 BID, transition to daily 11/18, then D/C 11/19  Poor social situation Has been uninsured and not taking meds due to inability to afford. - TOC consult for resources  Throat pain Had throat pain on admission that is still present, most likely due to forceful vomiting prior to admission. Reports pain with swallowing leading to decreased PO. On exam, mucous membranes dry.  - LR @ 33mL/hr for 8hrs - Chloraseptic spray prn for throat pain - Encouraged PO - Compazine prn for N/V  HTN (hypertension) - Labetalol 200 BID. Consider switching this to Amlo tomorrow - monitor BP  Hypokalemia-resolved as of 07/10/2022 Likely 2/2 poor PO, vomiting, diarrhea.  - AM CMP, consider repletion if still  low  Hyponatremia-resolved as of 07/10/2022 Likely due to poor PO and vomiting.  - AM CMP - Encourage PO as tolerated  Fatty liver-resolved as of 07/10/2022 Stable hepatosteatosis noted on RUQ [place]. Labs notable for transaminitis and bilirubinemia. Likely 2/2 to chronic alcohol use.  - f/u hepatitis panel   FEN/GI: Heart healthy PPx: Lovenox Dispo:Home with home health pending clinical improvement . Barriers include still needing monitoring for alcohol withdrawal.   Subjective:  Reports that throat is still burning (this was present on admission). He feels very thirsty but isn't drinking well due to throat pain.   Objective: Temp:  [98 F (36.7 C)-99.1 F (37.3 C)] 98.2 F (36.8 C) (11/17 0905) Pulse Rate:  [96-109] 106 (11/17 0905) Resp:  [19-34] 20 (11/17 0905) BP: (108-147)/(68-112) 146/107 (11/17 0905) SpO2:  [94 %-100 %] 100 % (11/17 0905) Physical Exam: General: Uncomfortable, alert, sitting in bed, talking.  Cardiovascular: Fast rate, regular rhythm Respiratory: CTAB. Normal WOB on RA Abdomen: Soft, nontender, nondistended. Normal BS. Skin: Diaphoretic and warm  Laboratory: Most recent CBC Lab Results  Component Value Date   WBC 4.6 07/10/2022   HGB 13.1 07/10/2022   HCT 36.3 (L) 07/10/2022   MCV 100.8 (H) 07/10/2022   PLT 194 07/10/2022   Most recent BMP    Latest Ref Rng & Units 07/10/2022    9:45 AM  BMP  Glucose 70 - 99 mg/dL 163   BUN 6 - 20 mg/dL 10   Creatinine 0.61 - 1.24 mg/dL 0.73   Sodium 135 - 145 mmol/L 137   Potassium 3.5 - 5.1 mmol/L 3.7  Chloride 98 - 111 mmol/L 105   CO2 22 - 32 mmol/L 20   Calcium 8.9 - 10.3 mg/dL 8.8     Philip Brigham, MD 07/10/2022, 1:23 PM  PGY-1, [facility] Family Medicine FPTS Intern pager: [phone], text pages welcome Secure chat group [facility] Teaching Service

## 2022-07-10 NOTE — Progress Notes (Signed)
FMTS Interim Progress Note Pt was frustrated because he thought starting Suboxone was going to replace his ativan. I clariefied that Suboxone was for OUD and Ativan was for alcohol withdrawal. We stopped his ativan order so that team can go evaluate him in person before giving a dose. I discussed that we should still try to taper from q4h to at least q6h. Pt was reassured and agreeable to this plan. - Eval pt if CIWAs >8, can give 0.1 Ativan as needed - Try to avoid giving ativan >q6h - Continue Suboxone as previously planned   Lincoln Brigham, MD 07/10/2022, 3:26 PM PGY-1, [facility] Family Medicine Service pager [phone]

## 2022-07-10 NOTE — Progress Notes (Signed)
OT Cancellation Note  Patient Details Name: HENRYK URSIN MRN: [id number] DOB: 26-Sep-1980   Cancelled Treatment:    Reason Eval/Treat Not Completed: Medical issues which prohibited therapy;Other (comment) (RN requesting not to see today due to elevated HR. Will continue to follow.)  Tyler Deis, OTR/L [facility] Acute Rehabilitation Office: [phone]   Myrla Halsted 07/10/2022, 2:32 PM

## 2022-07-10 NOTE — Plan of Care (Signed)

## 2022-07-10 NOTE — TOC Progression Note (Addendum)
Transition of Care [facility]) - Progression Note    Patient Details  Name: Philip Cuevas MRN: [id number] Date of Birth: 06/26/1981  Transition of Care [facility]) CM/SW [place], LCSW Phone Number: 07/10/2022, 4:14 PM  Clinical Narrative:    CSW met with patient, spouse, and son at bedside and answered questions about insurance and applying for Medicaid through DSS. CSW sent referral to Financial Counseling for review as well. CSW also provided substance use resources. They are aware that patient needs to be more physically independent to be able to be accepted by the substance use programs. CSW asked about outpatient rehab since charity home health was unable to be obtained and spouse stated that they do not have the money for outpatient therapy or a PCP.   Update: Spouse spoke with Sharyn Lull at [facility] and is requesting CSW send them referral once medically stable.    Expected Discharge Plan: Home/Self Care Barriers to Discharge: Continued Medical Work up  Expected Discharge Plan and Services Expected Discharge Plan: Home/Self Care In-house Referral: Clinical Social Work Discharge Planning Services: CM Consult Post Acute Care Choice: [place] arrangements for the past 2 months: [place]: PT [place]: Well [place] (Charity-Declined) Date [place]: 07/07/22 Time [place]: [place] Representative spoke with at [place]: [place]. Patient does not have insurance.   Social Determinants of Health (SDOH) Interventions    Readmission Risk Interventions     No data to display

## 2022-07-10 NOTE — Progress Notes (Signed)
FMTS Interim Progress Note Family at bedside, pt is sleeping. Family reports that he was mainly complaining of anxiety today and was asking for his next ativan dose. Family is also concerned about him going home because he reported craving a drink (presumably of alcohol). Family says he is no longer complaining of throat pain at this time.  - Start Suboxone for MAT - monitor COWS and give prn suboxone for >13   Lincoln Brigham, MD 07/10/2022, 2:28 PM PGY-1, [facility] Family Medicine Service pager [phone]

## 2022-07-10 NOTE — Progress Notes (Signed)
FMTS Interim Progress Note  Received notification from nurse that patient still wanting Ativan. Discussed with patient that we are only doing Ativan as needed for CIWAs based on his symptoms and not as needed based on other symptoms. I explained that this is the safe option for him as we are monitoring him closely. We also discussed suboxone and how he is having cravings but have improved. He is worried about affording this after the hospitalization, I told him we can get social work assistance if we need to to ensure he has what he needs upon discharge. All questions answered and patient agreeable. CIWA during my exam 1. Nurse also conveyed regarding this plan as well. Will continue to monitor CIWAs closely.    Reece Leader, DO 07/10/2022, 4:16 PM PGY-3, [facility] Family Medicine Service pager [phone]

## 2022-07-10 NOTE — Progress Notes (Signed)
Physical Therapy Treatment Patient Details Name: Philip Cuevas MRN: [id number] DOB: Oct 06, 1980 Today's Date: 07/10/2022   History of Present Illness 41 yo male admitted 11/13 with ETOH and heroin withdrawal. PMHx: polysubstance abuse, asthma, insomnia, HLD, obesity, OSA    PT Comments    Patient progressing well towards PT goals. Eager to get up and move with therapist. Requires Mod A for bed mobility and min guard assist for transfers and ambulation due to mild unsteadiness. Noted to have 2-3/4 DOE with activity with RR up to 40 with mobility. 1 seated rest break needed before ambulation bouts. Wife concerned about pt coming home as she works and will not be there all day. Consulted mobility tech to increase activity while in the hospital and encouraged pt to walk with nurse tech 3-4 times per day as well to improve balance/strength. Will continue to follow.    Recommendations for follow up therapy are one component of a multi-disciplinary discharge planning process, led by the attending physician.  Recommendations may be updated based on patient status, additional functional criteria and insurance authorization.  Follow Up Recommendations  Home health PT     Assistance Recommended at Discharge Frequent or constant Supervision/Assistance  Patient can return home with the following A little help with walking and/or transfers;A little help with bathing/dressing/bathroom;Assistance with cooking/housework;Assist for transportation;Direct supervision/assist for medications management;Direct supervision/assist for financial management;Help with stairs or ramp for entrance   Equipment Recommendations  None recommended by PT    Recommendations for Other Services       Precautions / Restrictions Precautions Precautions: Fall;Other (comment) Precaution Comments: watch RR Restrictions Weight Bearing Restrictions: No     Mobility  Bed Mobility Overal bed mobility: Needs Assistance Bed  Mobility: Supine to Sit     Supine to sit: Mod assist, HOB elevated     General bed mobility comments: Pt reaching for therapist's hand to assist with getting to EOB. Assist with trunk to sit upright.    Transfers Overall transfer level: Needs assistance Equipment used: None Transfers: Sit to/from Stand Sit to Stand: Min guard           General transfer comment: Min guard assist for safety to stand from EOB x2, slow to rise.    Ambulation/Gait Ambulation/Gait assistance: Min assist, Min guard Gait Distance (Feet): 150 Feet (x2 bouts) Assistive device: None Gait Pattern/deviations: Step-through pattern, Decreased stride length, Wide base of support Gait velocity: decr Gait velocity interpretation: 1.31 - 2.62 ft/sec, indicative of limited community ambulator   General Gait Details: Slow, mildly unsteady gait with some drifting noted with wide BoS but no overt LOB. 1 seated rest break. 2-3/4 DOE noted. RR up to 40.   Stairs             Wheelchair Mobility    Modified Rankin (Stroke Patients Only)       Balance Overall balance assessment: Needs assistance Sitting-balance support: Feet supported, No upper extremity supported Sitting balance-Leahy Scale: Good     Standing balance support: During functional activity Standing balance-Leahy Scale: Fair Standing balance comment: Able to stand and walk with close min guard for safety due to unpredictable balance/weakness.                            Cognition Arousal/Alertness: Awake/alert Behavior During Therapy: Flat affect Overall Cognitive Status: Impaired/Different from baseline Area of Impairment: Problem solving, Safety/judgement  Safety/Judgement: Decreased awareness of safety, Decreased awareness of deficits   Problem Solving: Requires verbal cues General Comments: sarcastic sense of humor, follows commands well. Easily distracted but redirectable. Asking  for therapist to tech to get up and walk so improving safety awareness.        Exercises      General Comments General comments (skin integrity, edema, etc.): Family present during session.      Pertinent Vitals/Pain Pain Assessment Pain Assessment: Faces Faces Pain Scale: Hurts little more Pain Location: feet (neuropathy) Pain Descriptors / Indicators: Discomfort Pain Intervention(s): Monitored during session    Home Living                          Prior Function            PT Goals (current goals can now be found in the care plan section) Progress towards PT goals: Progressing toward goals    Frequency    Min 3X/week      PT Plan Current plan remains appropriate    Co-evaluation              AM-PAC PT "6 Clicks" Mobility   Outcome Measure  Help needed turning from your back to your side while in a flat bed without using bedrails?: A Little Help needed moving from lying on your back to sitting on the side of a flat bed without using bedrails?: A Lot Help needed moving to and from a bed to a chair (including a wheelchair)?: A Little Help needed standing up from a chair using your arms (e.g., wheelchair or bedside chair)?: A Little Help needed to walk in hospital room?: A Little Help needed climbing 3-5 steps with a railing? : A Little 6 Click Score: 17    End of Session Equipment Utilized During Treatment: Gait belt Activity Tolerance: Patient tolerated treatment well Patient left: in bed;with call bell/phone within reach;with family/visitor present (sitting EOB with family present in room) Nurse Communication: Mobility status PT Visit Diagnosis: Other abnormalities of gait and mobility (R26.89);History of falling (Z91.81)     Time: [id number] PT Time Calculation (min) (ACUTE ONLY): 21 min  Charges:  $Gait Training: 8-22 mins                     Marisa Severin, PT, DPT Acute Rehabilitation Services Secure chat preferred Office  [place] 07/10/2022, 12:42 PM

## 2022-07-11 DIAGNOSIS — R000 Tachycardia, unspecified: Secondary | ICD-10-CM

## 2022-07-11 DIAGNOSIS — F10930 Alcohol use, unspecified with withdrawal, uncomplicated: Secondary | ICD-10-CM

## 2022-07-11 LAB — CBC
HCT: 35.9 % — ABNORMAL LOW (ref 39.0–52.0)
Hemoglobin: 12.8 g/dL — ABNORMAL LOW (ref 13.0–17.0)
MCH: 36.4 pg — ABNORMAL HIGH (ref 26.0–34.0)
MCHC: 35.7 g/dL (ref 30.0–36.0)
MCV: 102 fL — ABNORMAL HIGH (ref 80.0–100.0)
Platelets: 204 10*3/uL (ref 150–400)
RBC: 3.52 MIL/uL — ABNORMAL LOW (ref 4.22–5.81)
RDW: 15.1 % (ref 11.5–15.5)
WBC: 4.5 10*3/uL (ref 4.0–10.5)
nRBC: 0 % (ref 0.0–0.2)

## 2022-07-11 LAB — COMPREHENSIVE METABOLIC PANEL
ALT: 86 U/L — ABNORMAL HIGH (ref 0–44)
AST: 63 U/L — ABNORMAL HIGH (ref 15–41)
Albumin: 2.8 g/dL — ABNORMAL LOW (ref 3.5–5.0)
Alkaline Phosphatase: 59 U/L (ref 38–126)
Anion gap: 8 (ref 5–15)
BUN: 11 mg/dL (ref 6–20)
CO2: 22 mmol/L (ref 22–32)
Calcium: 8.9 mg/dL (ref 8.9–10.3)
Chloride: 107 mmol/L (ref 98–111)
Creatinine, Ser: 0.83 mg/dL (ref 0.61–1.24)
GFR, Estimated: >60 mL/min (ref >60)
Glucose, Bld: 124 mg/dL — ABNORMAL HIGH (ref 70–99)
Potassium: 3.4 mmol/L — ABNORMAL LOW (ref 3.5–5.1)
Sodium: 137 mmol/L (ref 135–145)
Total Bilirubin: 0.8 mg/dL (ref 0.3–1.2)
Total Protein: 5.1 g/dL — ABNORMAL LOW (ref 6.5–8.1)

## 2022-07-11 MED ORDER — POTASSIUM CHLORIDE 20 MEQ PO PACK
40.0000 meq | PACK | Freq: Once | ORAL | Status: AC
  Administered 2022-07-11: 40 meq via ORAL
  Filled 2022-07-11: qty 2

## 2022-07-11 MED ORDER — LABETALOL HCL 100 MG PO TABS
100.0000 mg | ORAL_TABLET | Freq: Two times a day (BID) | ORAL | Status: DC
  Administered 2022-07-11: 100 mg via ORAL
  Filled 2022-07-11 (×2): qty 1

## 2022-07-11 MED ORDER — AMLODIPINE BESYLATE 5 MG PO TABS
5.0000 mg | ORAL_TABLET | Freq: Every day | ORAL | Status: DC
  Administered 2022-07-11 – 2022-07-12 (×2): 5 mg via ORAL
  Filled 2022-07-11 (×2): qty 1

## 2022-07-11 NOTE — Plan of Care (Signed)

## 2022-07-11 NOTE — Progress Notes (Signed)
   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 He is concerned about his physical health and spiritual direction. Patient has the support of family but must remain positive in order to get better.  Chaplain provided patient with healing scriptures and prayed with patient, providing encouragement.  Patient would benefit from continued visits from Chaplains.   Jon Gills, Resident Chaplain [phone]

## 2022-07-11 NOTE — Progress Notes (Signed)
Daily Progress Note Intern Pager: [phone]  Patient name: Philip Cuevas Medical record number: [id number] Date of birth: 05-11-1981 Age: 41 y.o. Gender: male  Primary Care Provider: Monica Becton, MD Consultants: None Code Status: Full  Pt Overview and Major Events to Date:  11/13 Admitted to FMTS 11/14 Transition to ICU 11/17 Return to FMTS  Assessment and Plan: JS is 41yo M admitted for alcohol and opioid withdrawal. Overall, pt continues to improve clinically but requires ongoing hospitalization for IV medications and continued withdrawal symptoms.   Pertinent PMH/PSH includes alcohol use disorder, opioid use disorder, asthma.   * Alcohol withdrawal (HCC) Pt primarily complains of continued anxiety. CIWAs 3-4 ON, did not require ativan. I discussed with pt that we are trying to wean Ativan and he understands. Overall he continues to improve from an alcohol withdrawal perspective. - CIWAs. No ativan currently ordered, but can give if CIWA>8 - Cont Phenobarb taper - Daily thiamine, folate, multivit - Seizure precaution  Opioid use disorder Pt plans to get connected with Narcotic Anonymous once discharged.  - Suboxone 8-2mg  BID sch - COWs w/ Suboxone prn - Will need outpt follow-up for suboxone management (potential [facility]) - Clonidine 0.1 once today, then stop  Poor social situation Has been uninsured and not taking meds due to inability to afford. - TOC consult for resources. Trying to connect pt with [facility].  Throat pain - Chloraseptic spray prn for throat pain - Encouraged PO - Compazine prn for N/V  HTN (hypertension) - Start Amlodipine 5mg  daily - Labetalol 200 BID to 100 BID, then d/c as tolerated. - monitor BP  Hypokalemia-resolved as of 07/10/2022 Likely 2/2 poor PO, vomiting, diarrhea.  - AM CMP, consider repletion if still low  Hyponatremia-resolved as of 07/10/2022 Likely due to poor PO and vomiting.  - AM CMP - Encourage PO as  tolerated  Fatty liver-resolved as of 07/10/2022 Stable hepatosteatosis noted on RUQ 07/12/2022. Labs notable for transaminitis and bilirubinemia. Likely 2/2 to chronic alcohol use.  - f/u hepatitis panel   FEN/GI: Heart diet PPx: Lovenox Dispo:Home with home health in 3 or more days. Barriers include ongoing alcohol withdrawal.   Subjective:  Pt was frustrated because he thought suboxone was replacing his ativan. I clarified that suboxone was start for OUD, and ativan was being tapered down, not removed entirely.   Furthermore, he is worried about not being able to afford suboxone outpt, so he's worried about having to wean off that too once he discharges. He got resources and is interested in Narcotics A outpt.   Objective: Temp:  [97.7 F (36.5 C)-99.7 F (37.6 C)] 97.9 F (36.6 C) (11/18 0745) Pulse Rate:  [97-116] 98 (11/18 0745) Resp:  [16-25] 25 (11/18 0745) BP: (134-157)/(86-107) 143/107 (11/18 0745) SpO2:  [94 %-97 %] 97 % (11/18 0745) Physical Exam: General: Anxious appearing, laying in bed, alert, talking. Cardiovascular: RRR Respiratory: Normal WOB on RA. CTAB Abdomen: Soft, nontender, nondistended. Normal BS. Skin: Warm, sweaty  Laboratory: Most recent CBC Lab Results  Component Value Date   WBC 4.5 07/11/2022   HGB 12.8 (L) 07/11/2022   HCT 35.9 (L) 07/11/2022   MCV 102.0 (H) 07/11/2022   PLT 204 07/11/2022   Most recent BMP    Latest Ref Rng & Units 07/11/2022    2:43 AM  BMP  Glucose 70 - 99 mg/dL 07/13/2022   BUN 6 - 20 mg/dL 11   Creatinine 725 - 1.24 mg/dL 3.66   Sodium 4.40 -  145 mmol/L 137   Potassium 3.5 - 5.1 mmol/L 3.4   Chloride 98 - 111 mmol/L 107   CO2 22 - 32 mmol/L 22   Calcium 8.9 - 10.3 mg/dL 8.9    Arlyce Dice, MD 07/11/2022, 11:28 AM  PGY-1, [place] Intern pager: [phone], text pages welcome Secure chat group [place]

## 2022-07-12 DIAGNOSIS — F1193 Opioid use, unspecified with withdrawal: Secondary | ICD-10-CM

## 2022-07-12 DIAGNOSIS — K760 Fatty (change of) liver, not elsewhere classified: Secondary | ICD-10-CM

## 2022-07-12 HISTORY — DX: Opioid use, unspecified with withdrawal: F11.93

## 2022-07-12 LAB — CBC
HCT: 37.2 % — ABNORMAL LOW (ref 39.0–52.0)
Hemoglobin: 13 g/dL (ref 13.0–17.0)
MCH: 36 pg — ABNORMAL HIGH (ref 26.0–34.0)
MCHC: 34.9 g/dL (ref 30.0–36.0)
MCV: 103 fL — ABNORMAL HIGH (ref 80.0–100.0)
Platelets: 231 10*3/uL (ref 150–400)
RBC: 3.61 MIL/uL — ABNORMAL LOW (ref 4.22–5.81)
RDW: 14.6 % (ref 11.5–15.5)
WBC: 4.5 10*3/uL (ref 4.0–10.5)
nRBC: 0 % (ref 0.0–0.2)

## 2022-07-12 LAB — COMPREHENSIVE METABOLIC PANEL
ALT: 73 U/L — ABNORMAL HIGH (ref 0–44)
AST: 48 U/L — ABNORMAL HIGH (ref 15–41)
Albumin: 3 g/dL — ABNORMAL LOW (ref 3.5–5.0)
Alkaline Phosphatase: 58 U/L (ref 38–126)
Anion gap: 11 (ref 5–15)
BUN: 11 mg/dL (ref 6–20)
CO2: 23 mmol/L (ref 22–32)
Calcium: 9 mg/dL (ref 8.9–10.3)
Chloride: 105 mmol/L (ref 98–111)
Creatinine, Ser: 0.75 mg/dL (ref 0.61–1.24)
GFR, Estimated: >60 mL/min (ref >60)
Glucose, Bld: 98 mg/dL (ref 70–99)
Potassium: 3.6 mmol/L (ref 3.5–5.1)
Sodium: 139 mmol/L (ref 135–145)
Total Bilirubin: 0.9 mg/dL (ref 0.3–1.2)
Total Protein: 5.3 g/dL — ABNORMAL LOW (ref 6.5–8.1)

## 2022-07-12 LAB — GLUCOSE, CAPILLARY: Glucose-Capillary: 168 mg/dL — ABNORMAL HIGH (ref 70–99)

## 2022-07-12 MED ORDER — LABETALOL HCL 100 MG PO TABS
100.0000 mg | ORAL_TABLET | Freq: Once | ORAL | Status: AC
  Administered 2022-07-12: 100 mg via ORAL
  Filled 2022-07-12 (×2): qty 1

## 2022-07-12 MED ORDER — VITAMIN B-1 100 MG PO TABS: 100.0000 mg | ORAL_TABLET | Freq: Every day | ORAL | 2 refills | Status: DC

## 2022-07-12 MED ORDER — PHENOBARBITAL 32.4 MG PO TABS: 32.4000 mg | ORAL_TABLET | Freq: Three times a day (TID) | ORAL | 0 refills | Status: DC

## 2022-07-12 MED ORDER — FOLIC ACID 1 MG PO TABS: 1.0000 mg | ORAL_TABLET | Freq: Every day | ORAL | 2 refills | Status: DC

## 2022-07-12 NOTE — TOC Progression Note (Signed)
Transition of Care [facility]) - Initial/Assessment Note    Patient Details  Name: Philip Cuevas MRN: [id number] Date of Birth: Dec 30, 1980  Transition of [facility]) CM/SW Contact:    Ralene Bathe, LCSWA Phone Number: 07/12/2022, 4:07 PM  Clinical Narrative:                 LCSW received message from floor RN that patient is requesting to speak with LCSW.  LCSW contacted the family and was informed that the patient is discharging today and have plans to take the patient to [facility] tomorrow, but would need a referral.  LCSW reviewed the chart and past notes reflect that a referral would be sent to [facility] when patient is medically ready.  LCSW informed the patient's spouse that LCSW will fax over the information for the facility to review tomorrow when they return.    TOC will sign off as patient is discharging today.   Expected Discharge Plan: Home/Self Care Barriers to Discharge: Continued Medical Work up   Patient Goals and CMS Choice Patient states their goals for this hospitalization and ongoing recovery are:: To return home CMS Medicare.gov Compare Post Acute Care list provided to:: Patient Choice offered to / list presented to : Patient  Expected Discharge Plan and Services Expected Discharge Plan: Home/Self Care In-house Referral: Clinical Social Work Discharge Planning Services: CM Consult Post Acute Care Choice: Home Health Living arrangements for the past 2 months: Single Family Home Expected Discharge Date: 07/12/22                         HH Arranged: PT HH Agency: [facility] (Charity-Declined) Date HH Agency Contacted: 07/07/22 Time HH Agency Contacted: 1538 Representative spoke with at [facility] Agency: Jerilynn Som- Declined- Charity. Patient does not have insurance.  Prior Living Arrangements/Services Living arrangements for the past 2 months: Single Family Home Lives with:: Spouse, Minor Children Patient language and need for interpreter reviewed:: Yes Do  you feel safe going back to the place where you live?: Yes      Need for Family Participation in Patient Care: Yes (Comment) Care giver support system in place?: Yes (comment)   Criminal Activity/Legal Involvement Pertinent to Current Situation/Hospitalization: No - Comment as needed  Activities of Daily Living Home Assistive Devices/Equipment: None ADL Screening (condition at time of admission) Patient's cognitive ability adequate to safely complete daily activities?: Yes Is the patient deaf or have difficulty hearing?: No Does the patient have difficulty seeing, even when wearing glasses/contacts?: No Does the patient have difficulty concentrating, remembering, or making decisions?: No Patient able to express need for assistance with ADLs?: Yes Does the patient have difficulty dressing or bathing?: No Independently performs ADLs?: Yes (appropriate for developmental age) Does the patient have difficulty walking or climbing stairs?: No Weakness of Legs: None Weakness of Arms/Hands: None  Permission Sought/Granted Permission sought to share information with : Case Manager, Magazine features editor, Family Supports Permission granted to share information with : Yes, Verbal Permission Granted  Share Information with NAME: Helaine Chess     Permission granted to share info w Relationship: Spouse  Permission granted to share info w Contact Information: [phone]  Emotional Assessment Appearance:: Appears stated age Attitude/Demeanor/Rapport: Engaged, Gracious Affect (typically observed): Accepting, Appropriate, Calm, Hopeful, Pleasant Orientation: : Oriented to Self, Oriented to Place, Oriented to  Time, Oriented to Situation Alcohol / Substance Use: Illicit Drugs Psych Involvement: No (comment)  Admission diagnosis:  Alcohol withdrawal (HCC) [F10.939] Patient Active  Problem List   Diagnosis Date Noted   Narcotic withdrawal ([place]) 07/12/2022   Tachycardia 07/11/2022    Polysubstance abuse ([place]) 07/10/2022   Alcohol withdrawal delirium ([place]) 07/07/2022   Alcohol withdrawal ([place]) 07/06/2022   Throat pain 07/06/2022   Poor social situation 07/06/2022   Opioid use disorder 07/06/2022   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   History of migraine headaches 08/29/2018   Venous stasis dermatitis of both lower extremities 12/22/2017   Morbid obesity ([place]) 04/30/2017   Obstructive sleep apnea 04/30/2017   Hypertriglyceridemia 03/03/2017   Chronic epigastric pain 03/02/2017   Left knee pain with intra-articular loose body 05/02/2012   HTN (hypertension) 05/07/2008   Erectile dysfunction 07/06/2007   ASTHMA, EXERCISE INDUCED 11/24/2006   Insomnia 06/04/2006   Anxiety state 06/01/2006   PCP:  Silverio Decamp, MD Pharmacy:   [facility] [address] [place] [address] Phone: [phone] Fax: [phone]  [id number] - [place], [address] AT [address] [place] [address] Phone: [phone] Fax: [place] [phone] [place], [place] AT [place] [address] [place] [address] Phone: [phone] Fax: [phone]  [facility] [id number] - [place], [address] AT [facility] [address] [address] Phone: [phone] Fax: [phone]     Social Determinants of Health (SDOH) Interventions    Readmission Risk Interventions     No data to display

## 2022-07-12 NOTE — Progress Notes (Signed)
Occupational Therapy Treatment and Discharge Patient Details Name: Philip Cuevas MRN: [id number] DOB: 1981/03/15 Today's Date: 07/12/2022   History of present illness 41 yo male admitted 11/13 with ETOH and heroin withdrawal. PMHx: polysubstance abuse, asthma, insomnia, HLD, obesity, OSA   OT comments  This 41 yo male seen today and is at an Independent/Mod I level for basic ADLs and mobility with min guard A for steps if there is not rail to use. No further OT needs, we will sign off.   Recommendations for follow up therapy are one component of a multi-disciplinary discharge planning process, led by the attending physician.  Recommendations may be updated based on patient status, additional functional criteria and insurance authorization.    Follow Up Recommendations  No OT follow up     Assistance Recommended at Discharge None  Patient can return home with the following  Help with stairs or ramp for entrance (stairs if no rail)   Equipment Recommendations  None recommended by OT       Precautions / Restrictions Precautions Precautions: Fall (only on steps if does not have a rail) Restrictions Weight Bearing Restrictions: No       Mobility Bed Mobility Overal bed mobility: Independent                  Transfers Overall transfer level: Independent                 General transfer comment: min guard on steps without use of rail (has no rail at home and 2 steps), with a rail he is Mod I using a 'step to" pattern     Balance Overall balance assessment: Independent Sitting-balance support: No upper extremity supported, Feet supported Sitting balance-Leahy Scale: Good     Standing balance support: No upper extremity supported Standing balance-Leahy Scale: Fair Standing balance comment: able to stand and walk in hallways at a Mod I level without use of AD--only balance issue was down the steps (reports due to bad knees)                            ADL either performed or assessed with clinical judgement   ADL Overall ADL's : Modified independent                                        We did discuss if he was going to use the garden tub and get down in it that he sit on the side, turn around and lower himself into tub with reversing the process with getting out. Wife reports she is always there when he does a tub bath.    Extremity/Trunk Assessment Upper Extremity Assessment Upper Extremity Assessment: Overall WFL for tasks assessed            Vision Patient Visual Report: No change from baseline            Cognition Arousal/Alertness: Awake/alert Behavior During Therapy: Flat affect Overall Cognitive Status: Within Functional Limits for tasks assessed                                                     Pertinent Vitals/ Pain  Pain Assessment Pain Assessment: Faces Faces Pain Scale: Hurts little more Pain Location: Bil knees with going down steps Pain Descriptors / Indicators: Aching, Sore Pain Intervention(s): Monitored during session            Progress Toward Goals  OT Goals(current goals can now be found in the care plan section)  Progress towards OT goals: Goals met/education completed, patient discharged from OT  Acute Rehab OT Goals Patient Stated Goal: wife wants him to go to [place] Discharge plan remains appropriate       AM-PAC OT "6 Clicks" Daily Activity     Outcome Measure   Help from another person eating meals?: None Help from another person taking care of personal grooming?: None Help from another person toileting, which includes using toliet, bedpan, or urinal?: None Help from another person bathing (including washing, rinsing, drying)?: None Help from another person to put on and taking off regular upper body clothing?: None Help from another person to put on and taking off regular lower body clothing?: None 6 Click Score: 24     End of Session Equipment Utilized During Treatment: Gait belt      Activity Tolerance Patient tolerated treatment well   Patient Left in bed;with call bell/phone within reach;with family/visitor present (wife and son)           Time: 7341-9379 OT Time Calculation (min): 16 min  Charges: OT General Charges $OT Visit: 1 Visit OT Treatments $Self Care/Home Management : 8-22 mins  Golden Circle, OTR/L Acute Rehab Services [facility] [phone] Office [phone]    Almon Register 07/12/2022, 1:07 PM

## 2022-07-12 NOTE — Discharge Summary (Addendum)
Family Medicine Teaching [facility] Discharge Summary  Patient name: Philip Cuevas Medical record number: [id number] Date of birth: 05-14-1981 Age: 41 y.o. Gender: male Date of Admission: 07/06/2022  Date of Discharge: 07/12/2022 Admitting Physician: No admitting provider for patient encounter.  Primary Care Provider: Monica Becton, MD Consultants: none  Indication for Hospitalization: alcohol withdrawal  Discharge Diagnoses/Problem List:  Principal Problem for Admission:  alcohol withdrawal Other Problems addressed during stay:  Principal Problem:   Alcohol withdrawal (HCC) Active Problems:   HTN (hypertension)   Throat pain   Poor social situation   Opioid use disorder   Alcohol withdrawal delirium (HCC)   Polysubstance abuse (HCC)   Tachycardia   Narcotic withdrawal [facility])    Brief Hospital Course:  ANGELUS HOOPES is a 41 y.o. male presenting from [facility] for medical management of alcohol and opioid withdrawal symptoms for past 9 days. Past medical history is significant for HTN, anxiety, depression, asthma, ED, obesity, alcohol use disorder, opioid use disorder. His hospital course is outlined below:   Opiate Withdrawal:  Patient presented to the ED 9 days out from opioid withdrawal. Previously using 0.5 g heroin/day. He experienced N/V, tremors, and diarrhea. He compensated by drinking more alcohol. COWS score in the ED was negative. Initially started on Suboxone, but patient declined further treatment with the medication. He and wife were given information about outpatient treatment options. Now that medically stable, they elected to pursue outpatient therapy at [facility].   Alcohol Withdrawal:  As stated above, increased alcohol consumption during opiate withdrawal. Currently drinking 1/5 vodka daily with beer. On presentation he was tremulous, nauseated, SOB. VS significant for tachycardia to the 130s, tachypnea, and hypertension. He was started on PRN  Ativan and received 13 mg before adding Librium taper and eventually phenobarbital. He remained agitated with elevated CIWAs >20 on the previously listed meds. CCM was consulted and he was transferred to the ICU. He spent 3 days in the ICU with intermittent use of Precedex and continued phenobarbital taper. MEWS remained elevated >5 due to tachycardia and tachypnea. He was transferred by the FMTS on phenobarbital taper once his vital signs stabilized. CIWAs remained stable. He was offered multiple medications and interventions for complete alcohol cessation. He intends to return to using alcohol despite counseling. He was given resources for this.   HTN:  Started on clonidine 0.1 mg BID. Pressures remained stable. Tolerated wean well. Started on Amlodipine 5 mg daily with improved BP noted.   Tachycardia: Present since admission and improving at discharge. Thought to be due to withdrawal as well as rebound from multiple medications in ICU.   Elevated ALT/AST likely due to alcohol use and hepatic steatosis.     Disposition: home  Discharge Condition: medically stable  Issues for Follow Up:  Concern for sleep apnea, patient would benefit from sleep study outpatient. Encourage alcohol and opioid cessation.  Discharged on phenobarbital taper.  Amlodipine 5 mg started for BP, please reassess and make adjustments as appropriate.  Repeat hepatic panel, BMP and CBC at follow up   Discharge Exam:  Vitals:   07/12/22 0832 07/12/22 1230  BP:    Pulse: (!) 113   Resp:    Temp:  98.3 F (36.8 C)  SpO2:       Significant Procedures: none  Significant Labs and Imaging:  Recent Labs  Lab 07/11/22 0243 07/12/22 0351  WBC 4.5 4.5  HGB 12.8* 13.0  HCT 35.9* 37.2*  PLT 204 231   Recent  Labs  Lab 07/11/22 0243 07/12/22 0351  NA 137 139  K 3.4* 3.6  CL 107 105  CO2 22 23  GLUCOSE 124* 98  BUN 11 11  CREATININE 0.83 0.75  CALCIUM 8.9 9.0  ALKPHOS 59 58  AST 63* 48*  ALT 86* 73*   ALBUMIN 2.8* 3.0*     Results/Tests Pending at Time of Discharge: none  Discharge Medications:  Allergies as of 07/12/2022   No Known Allergies      Medication List     STOP taking these medications    busPIRone 5 MG tablet Commonly known as: BUSPAR   fenofibrate 160 MG tablet   NEEDLE (DISP) 22 G 22G X 1-1/2" Misc   olmesartan-hydrochlorothiazide 40-25 MG tablet Commonly known as: BENICAR HCT   SYRINGE 3CC/18GX1-1/2" 18G X 1-1/2" 3 ML Misc   tadalafil 5 MG tablet Commonly known as: CIALIS   testosterone cypionate 200 MG/ML injection Commonly known as: DEPOTESTOSTERONE CYPIONATE       TAKE these medications    albuterol 108 (90 Base) MCG/ACT inhaler Commonly known as: Ventolin HFA Inhale 2 puffs into the lungs every 6 (six) hours as needed.   amLODipine 5 MG tablet Commonly known as: NORVASC Take 1 tablet (5 mg total) by mouth daily.   folic acid 1 MG tablet Commonly known as: FOLVITE Take 1 tablet (1 mg total) by mouth daily. Start taking on: July 13, 2022   PHENobarbital 32.4 MG tablet Commonly known as: LUMINAL Take 1 tablet (32.4 mg total) by mouth every 8 (eight) hours for 5 doses.   thiamine 100 MG tablet Commonly known as: Vitamin B-1 Take 1 tablet (100 mg total) by mouth daily. Start taking on: July 13, 2022        Discharge Instructions: Please refer to Patient Instructions section of EMR for full details.  Patient was counseled important signs and symptoms that should prompt return to medical care, changes in medications, dietary instructions, activity restrictions, and follow up appointments.   Follow-Up Appointments:  Follow-up Information     Monica Becton, MD. Schedule an appointment as soon as possible for a visit.   Specialties: Family Medicine, Sports Medicine, Radiology Why: Please make an appointment at your earliest convenience for a hospital follow up. Contact information: [address]  [address] [place] [address] [phone]                 Reece Leader, DO 07/12/2022, 3:35 PM PGY-3, [facility] Family Medicine

## 2022-07-12 NOTE — Discharge Instructions (Signed)
You were hospitalized at [facility] due to alcohol and opioid withdrawal.   We are so glad you are feeling better.  Please make sure to take all your medications as prescribed. Be sure to follow-up with your regularly scheduled appointments. Please also be sure to follow-up with your primary care provider at your earliest convenience. Thank you for allowing [place] to be a part of your medical care.  Take care, [facility] family medicine team

## 2022-07-12 NOTE — Progress Notes (Addendum)
Daily Progress Note Intern Pager: [phone]  Patient name: Philip Cuevas Medical record number: [id number] Date of birth: 08-24-1981 Age: 41 y.o. Gender: male  Primary Care Provider: Monica Becton, MD Consultants: None Code Status: Full  Pt Overview and Major Events to Date:  11/13: Admitted to FMTS 11/14: Transferred to ICU for AMS, tachycardia and tachypnea in setting of ETOH and opiate withdrawal with delirium 11/17: Returned to FMTS from ICU  Assessment and Plan: Philip Cuevas is a 41 year-old male admitted for alcohol and opiate withdrawal, continuing to improve clinically. Pertinent PMH/PSH includes alcohol use disorder, opioid use disorder, asthma .  * Alcohol withdrawal (HCC) Improving. CIWA 5-6 overnight. No Ativan administered. - CIWA scoring; Ativan PRN if CIWA >8 - Thiamine, Folate, MV - Continue phenobarbital taper  Opioid use disorder On Suboxone, but not taking as prescribed.  He says he would rather take this once daily, but really would rather not be on at all.  Completed clonidine wean. - D/c suboxone - COWS -  Patient planning to attend intensive treatment at [facility]. Medically stable for outpatient treatment at this time.  Poor social situation TOC working on [employer] for Medicaid through [employer]. Pt provided with substance use resources. - TOC following Trying to connect pt with [facility].  Cough Left lung field with mild crackles.  Patient with dry cough, but maintaining O2 >95% on room air. Likely atelectasis -Monitor SpO2, encourage OOB  HTN (hypertension) BP control improving- now at goal. - Labetolol wean to complete this morning - Continue Amlodipine 5 mg daily - monitor BP  FEN/GI: Heart healthy PPx: Lovenox Dispo: Home once o/p follow-up care established   Subjective:  Philip Cuevas was awake, reading breakfast menu this morning when I saw him.  He denies any complaints other than not having had a bowel  movement 3 days, but does not want a bowel regimen because he was having excessive bowel movements prior. He had 1/2 tablet of Suboxone sitting on the table.  He has been taking only half dose because he "does not want that to get into my system."  And "have to get off of something again in the future."  He tells me about his experience with methadone in the past, and that "that stuff messed me up."  He says the main reason that he came in was alcohol withdrawal and that his body "needed rest." He says he has not used any opioids in 2 weeks-that he had stopped using heroin for about a week before he came in. Denies chest pain, shortness of breath.  Does have a cough he says that has been bothering him.  Objective: Temp:  [97.9 F (36.6 C)-98.4 F (36.9 C)] 98 F (36.7 C) (11/18 2351) Pulse Rate:  [91-108] 97 (11/18 2351) Resp:  [15-25] 15 (11/18 2351) BP: (113-143)/(81-107) 122/93 (11/18 2351) SpO2:  [94 %-97 %] 96 % (11/18 2351) Physical Exam: General: Sitting comfortably upright in bed, reading breakfast menu  Cardiovascular: Regular rate and rhythm Respiratory: Normal work of breathing on room air.  Left lung field with mild crackles. Abdomen: Soft, nontender nondistended.  Normal active bowel sounds Extremities: Warm, well-perfused Skin: Warm, dry  Laboratory: Most recent CBC Lab Results  Component Value Date   WBC 4.5 07/12/2022   HGB 13.0 07/12/2022   HCT 37.2 (L) 07/12/2022   MCV 103.0 (H) 07/12/2022   PLT 231 07/12/2022   Most recent BMP    Latest Ref Rng & Units 07/12/2022  3:51 AM  BMP  Glucose 70 - 99 mg/dL 98   BUN 6 - 20 mg/dL 11   Creatinine 2.50 - 1.24 mg/dL 5.39   Sodium 767 - 341 mmol/L 139   Potassium 3.5 - 5.1 mmol/L 3.6   Chloride 98 - 111 mmol/L 105   CO2 22 - 32 mmol/L 23   Calcium 8.9 - 10.3 mg/dL 9.0     Imaging/Diagnostic Tests: No new imaging.  Darral Dash, DO 07/12/2022, 6:42 AM  PGY-2, [facility] FPTS Intern  pager: [phone], text pages welcome Secure chat group [facility] Teaching Service

## 2022-07-13 ENCOUNTER — Telehealth: Payer: Self-pay | Admitting: General Practice

## 2022-07-13 NOTE — Telephone Encounter (Signed)
Transition Care Management Follow-up Telephone Call Date of discharge and from where: 07/12/22 from [facility] How have you been since you were released from the hospital? Still feeling pretty rough. However, he does not have insurance right now. Will schedule appt for after 12/1 since his medicaid will be starting then. Patient requested appt after 08/07/22 as he is going to a 28 day rehab facility. Any questions or concerns? No  Items Reviewed: Did the pt receive and understand the discharge instructions provided? Yes  Medications obtained and verified? Yes  Other? No  Any new allergies since your discharge? No  Dietary orders reviewed? Yes Do you have support at home? Yes   Home Care and Equipment/Supplies: Were home health services ordered? no  Functional Questionnaire: (I = Independent and D = Dependent) ADLs: i  Bathing/Dressing- i  Meal Prep- i  Eating- i  Maintaining continence- i  Transferring/Ambulation- i  Managing Meds- i  Follow up appointments reviewed:  PCP Hospital f/u appt confirmed? Yes  Scheduled to see Dr. Karie Schwalbe on 08/10/22 @ 1600. Specialist Hospital f/u appt confirmed? No   Are transportation arrangements needed? No  If their condition worsens, is the pt aware to call PCP or go to the Emergency Dept.? Yes Was the patient provided with contact information for the PCP's office or ED? Yes Was to pt encouraged to call back with questions or concerns? Yes

## 2022-07-14 ENCOUNTER — Encounter (HOSPITAL_COMMUNITY): Payer: Self-pay | Admitting: Registered Nurse

## 2022-07-14 ENCOUNTER — Encounter (HOSPITAL_COMMUNITY): Payer: Self-pay

## 2022-07-14 ENCOUNTER — Other Ambulatory Visit (HOSPITAL_COMMUNITY)
Admission: EM | Admit: 2022-07-14 | Discharge: 2022-07-17 | Disposition: A | Discharge status: Home / Self Care | Payer: No Payment, Other | Attending: Psychiatry | Admitting: Psychiatry

## 2022-07-14 ENCOUNTER — Other Ambulatory Visit: Payer: Self-pay

## 2022-07-14 DIAGNOSIS — F1910 Other psychoactive substance abuse, uncomplicated: Secondary | ICD-10-CM | POA: Diagnosis present

## 2022-07-14 DIAGNOSIS — F1090 Alcohol use, unspecified, uncomplicated: Secondary | ICD-10-CM

## 2022-07-14 DIAGNOSIS — R251 Tremor, unspecified: Secondary | ICD-10-CM | POA: Insufficient documentation

## 2022-07-14 DIAGNOSIS — F1190 Opioid use, unspecified, uncomplicated: Secondary | ICD-10-CM

## 2022-07-14 DIAGNOSIS — F10931 Alcohol use, unspecified with withdrawal delirium: Secondary | ICD-10-CM | POA: Diagnosis not present

## 2022-07-14 DIAGNOSIS — Z1152 Encounter for screening for COVID-19: Secondary | ICD-10-CM | POA: Diagnosis not present

## 2022-07-14 DIAGNOSIS — F322 Major depressive disorder, single episode, severe without psychotic features: Secondary | ICD-10-CM | POA: Diagnosis present

## 2022-07-14 DIAGNOSIS — F419 Anxiety disorder, unspecified: Secondary | ICD-10-CM | POA: Diagnosis present

## 2022-07-14 DIAGNOSIS — I10 Essential (primary) hypertension: Secondary | ICD-10-CM | POA: Diagnosis present

## 2022-07-14 DIAGNOSIS — F17200 Nicotine dependence, unspecified, uncomplicated: Secondary | ICD-10-CM | POA: Diagnosis present

## 2022-07-14 HISTORY — DX: Alcohol use, unspecified, uncomplicated: F10.90

## 2022-07-14 LAB — POCT URINE DRUG SCREEN - MANUAL ENTRY (I-SCREEN)
POC Amphetamine UR: NOT DETECTED
POC Buprenorphine (BUP): POSITIVE — AB
POC Cocaine UR: NOT DETECTED
POC Marijuana UR: NOT DETECTED
POC Methadone UR: NOT DETECTED
POC Methamphetamine UR: NOT DETECTED
POC Morphine: NOT DETECTED
POC Oxazepam (BZO): POSITIVE — AB
POC Oxycodone UR: NOT DETECTED
POC Secobarbital (BAR): POSITIVE — AB

## 2022-07-14 LAB — CBC WITH DIFFERENTIAL/PLATELET
Abs Immature Granulocytes: 0.07 10*3/uL (ref 0.00–0.07)
Basophils Absolute: 0.1 10*3/uL (ref 0.0–0.1)
Basophils Relative: 1 %
Eosinophils Absolute: 0.1 10*3/uL (ref 0.0–0.5)
Eosinophils Relative: 1 %
HCT: 42.1 % (ref 39.0–52.0)
Hemoglobin: 14.9 g/dL (ref 13.0–17.0)
Immature Granulocytes: 1 %
Lymphocytes Relative: 20 %
Lymphs Abs: 1 10*3/uL (ref 0.7–4.0)
MCH: 35.6 pg — ABNORMAL HIGH (ref 26.0–34.0)
MCHC: 35.4 g/dL (ref 30.0–36.0)
MCV: 100.7 fL — ABNORMAL HIGH (ref 80.0–100.0)
Monocytes Absolute: 0.4 10*3/uL (ref 0.1–1.0)
Monocytes Relative: 8 %
Neutro Abs: 3.6 10*3/uL (ref 1.7–7.7)
Neutrophils Relative %: 69 %
Platelets: 356 10*3/uL (ref 150–400)
RBC: 4.18 MIL/uL — ABNORMAL LOW (ref 4.22–5.81)
RDW: 14 % (ref 11.5–15.5)
WBC: 5.2 10*3/uL (ref 4.0–10.5)
nRBC: 0 % (ref 0.0–0.2)

## 2022-07-14 LAB — RESP PANEL BY RT-PCR (FLU A&B, COVID) ARPGX2
Influenza A by PCR: NEGATIVE
Influenza B by PCR: NEGATIVE
SARS Coronavirus 2 by RT PCR: NEGATIVE

## 2022-07-14 LAB — COMPREHENSIVE METABOLIC PANEL
ALT: 85 U/L — ABNORMAL HIGH (ref 0–44)
AST: 63 U/L — ABNORMAL HIGH (ref 15–41)
Albumin: 3.9 g/dL (ref 3.5–5.0)
Alkaline Phosphatase: 73 U/L (ref 38–126)
Anion gap: 11 (ref 5–15)
BUN: 8 mg/dL (ref 6–20)
CO2: 24 mmol/L (ref 22–32)
Calcium: 9.9 mg/dL (ref 8.9–10.3)
Chloride: 103 mmol/L (ref 98–111)
Creatinine, Ser: 0.76 mg/dL (ref 0.61–1.24)
GFR, Estimated: >60 mL/min (ref >60)
Glucose, Bld: 101 mg/dL — ABNORMAL HIGH (ref 70–99)
Potassium: 4.1 mmol/L (ref 3.5–5.1)
Sodium: 138 mmol/L (ref 135–145)
Total Bilirubin: 0.9 mg/dL (ref 0.3–1.2)
Total Protein: 6.5 g/dL (ref 6.5–8.1)

## 2022-07-14 LAB — HEMOGLOBIN A1C
Hgb A1c MFr Bld: 5.4 % (ref 4.8–5.6)
Mean Plasma Glucose: 108.28 mg/dL

## 2022-07-14 LAB — TSH: TSH: 1.785 u[IU]/mL (ref 0.350–4.500)

## 2022-07-14 LAB — LIPID PANEL
Cholesterol: 199 mg/dL (ref 0–200)
HDL: 40 mg/dL — ABNORMAL LOW (ref >40)
LDL Cholesterol: 123 mg/dL — ABNORMAL HIGH (ref 0–99)
Total CHOL/HDL Ratio: 5 RATIO
Triglycerides: 180 mg/dL — ABNORMAL HIGH (ref <150)
VLDL: 36 mg/dL (ref 0–40)

## 2022-07-14 LAB — POC SARS CORONAVIRUS 2 AG -  ED: SARS Coronavirus 2 Ag: NEGATIVE

## 2022-07-14 LAB — MAGNESIUM: Magnesium: 2.3 mg/dL (ref 1.7–2.4)

## 2022-07-14 LAB — ETHANOL: Alcohol, Ethyl (B): <10 mg/dL (ref <10)

## 2022-07-14 MED ORDER — ALBUTEROL SULFATE HFA 108 (90 BASE) MCG/ACT IN AERS: 2.0000 | INHALATION_SPRAY | Freq: Four times a day (QID) | RESPIRATORY_TRACT | Status: DC | PRN

## 2022-07-14 MED ORDER — NICOTINE POLACRILEX 2 MG MT GUM
2.0000 mg | CHEWING_GUM | OROMUCOSAL | Status: DC | PRN
  Administered 2022-07-14 – 2022-07-16 (×9): 2 mg via ORAL
  Filled 2022-07-14 (×9): qty 1

## 2022-07-14 MED ORDER — ADULT MULTIVITAMIN W/MINERALS CH
1.0000 | ORAL_TABLET | Freq: Every day | ORAL | Status: DC
  Administered 2022-07-15 – 2022-07-17 (×3): 1 via ORAL
  Filled 2022-07-14: qty 1
  Filled 2022-07-14: qty 14
  Filled 2022-07-14 (×2): qty 1

## 2022-07-14 MED ORDER — AMLODIPINE BESYLATE 5 MG PO TABS
5.0000 mg | ORAL_TABLET | Freq: Every day | ORAL | Status: DC
  Administered 2022-07-15 – 2022-07-17 (×3): 5 mg via ORAL
  Filled 2022-07-14: qty 14
  Filled 2022-07-14 (×3): qty 1

## 2022-07-14 MED ORDER — HYDROXYZINE HCL 25 MG PO TABS
25.0000 mg | ORAL_TABLET | Freq: Four times a day (QID) | ORAL | Status: AC | PRN
  Administered 2022-07-14 – 2022-07-16 (×3): 25 mg via ORAL
  Filled 2022-07-14 (×3): qty 1

## 2022-07-14 MED ORDER — MAGNESIUM HYDROXIDE 400 MG/5ML PO SUSP: 30.0000 mL | Freq: Every day | ORAL | Status: DC | PRN

## 2022-07-14 MED ORDER — ACETAMINOPHEN 325 MG PO TABS
650.0000 mg | ORAL_TABLET | Freq: Four times a day (QID) | ORAL | Status: DC | PRN
  Administered 2022-07-14: 650 mg via ORAL
  Filled 2022-07-14: qty 2

## 2022-07-14 MED ORDER — TRAZODONE HCL 50 MG PO TABS
50.0000 mg | ORAL_TABLET | Freq: Once | ORAL | Status: AC
  Administered 2022-07-14: 50 mg via ORAL

## 2022-07-14 MED ORDER — NICOTINE 21 MG/24HR TD PT24
21.0000 mg | MEDICATED_PATCH | Freq: Once | TRANSDERMAL | Status: AC
  Administered 2022-07-14: 21 mg via TRANSDERMAL
  Filled 2022-07-14: qty 1

## 2022-07-14 MED ORDER — ONDANSETRON 4 MG PO TBDP: 4.0000 mg | ORAL_TABLET | Freq: Four times a day (QID) | ORAL | Status: AC | PRN

## 2022-07-14 MED ORDER — TRAZODONE HCL 50 MG PO TABS
50.0000 mg | ORAL_TABLET | Freq: Every evening | ORAL | Status: DC | PRN
  Administered 2022-07-14 – 2022-07-15 (×2): 50 mg via ORAL
  Filled 2022-07-14 (×4): qty 1

## 2022-07-14 MED ORDER — ALUM & MAG HYDROXIDE-SIMETH 200-200-20 MG/5ML PO SUSP: 30.0000 mL | ORAL | Status: DC | PRN

## 2022-07-14 MED ORDER — ADULT MULTIVITAMIN W/MINERALS CH: 1.0000 | ORAL_TABLET | Freq: Every day | ORAL | Status: DC

## 2022-07-14 MED ORDER — LOPERAMIDE HCL 2 MG PO CAPS
2.0000 mg | ORAL_CAPSULE | ORAL | Status: AC | PRN
  Administered 2022-07-14 – 2022-07-16 (×4): 2 mg via ORAL
  Filled 2022-07-14 (×3): qty 1
  Filled 2022-07-14: qty 2

## 2022-07-14 MED ORDER — NICOTINE 21 MG/24HR TD PT24
21.0000 mg | MEDICATED_PATCH | Freq: Every day | TRANSDERMAL | Status: DC
  Administered 2022-07-15 – 2022-07-17 (×3): 21 mg via TRANSDERMAL
  Filled 2022-07-14: qty 1
  Filled 2022-07-14: qty 14
  Filled 2022-07-14 (×2): qty 1

## 2022-07-14 MED ORDER — THIAMINE MONONITRATE 100 MG PO TABS
100.0000 mg | ORAL_TABLET | Freq: Every day | ORAL | Status: DC
  Administered 2022-07-15 – 2022-07-17 (×3): 100 mg via ORAL
  Filled 2022-07-14: qty 1
  Filled 2022-07-14: qty 14
  Filled 2022-07-14 (×2): qty 1

## 2022-07-14 MED ORDER — FOLIC ACID 1 MG PO TABS
1.0000 mg | ORAL_TABLET | Freq: Every day | ORAL | Status: DC
  Administered 2022-07-15 – 2022-07-17 (×3): 1 mg via ORAL
  Filled 2022-07-14: qty 1
  Filled 2022-07-14: qty 14
  Filled 2022-07-14 (×2): qty 1

## 2022-07-14 MED ORDER — LORAZEPAM 1 MG PO TABS: 1.0000 mg | ORAL_TABLET | Freq: Four times a day (QID) | ORAL | Status: AC | PRN

## 2022-07-14 NOTE — ED Notes (Signed)
Patient admitted to [facility] from [facility].  Patient reports that he has been abstinent from substances for approximately 8 days after being discharged from [facility] ICU for detox from opiates and ETOH.  Patient attempted to go to [facility] for admission today however they denied him even though he completed a phenobarbital taper this morning.  He came here due to having the desire to drink again and not wanting to.  Patient calm, pleasant, organized and logical.  No complaints and no withdrawal symptoms.  He was oriented to unit and shown to his room.  Encouraged him to seek staff to have needs met.  He denies avh shi or plan.  Will monitor and provide safe environment.

## 2022-07-14 NOTE — Tx Team (Signed)
LCSW met with patient to assess current mood, affect, physical state, and inquire about needs/goals while here in [facility] and after discharge. Patient reports he presented to the [facility] on last Monday 11/13 after attempting to cold [place] opioids. Patient reported struggling with the withdrawal affects and stated he would binge drink to help cope with affects. Patient reported that he was transferred to [facility] ED due to dehydration and shakes where he ended up in the ICU after coding. Patient reports he spent about 7 days in the hospital prior to coming to the [facility]. Patient reports 19 days of being clean from opioids and 7 days since he has consumed alcohol. Patient identified himself as an alcoholic and addict, and stated if he was not in the [facility] he would be out drinking right now. Patient reports he wants to help himself which is why he has decided to "lock himself in a facility like this" in order to do so. Patient reports 15 years of opioid use stating it first started off after being put on pain management pills from playing football up until the age of 42. Patient reports the pain clinic closed so then he searched for the pain pills on the streets. Patient reported after not having access to the pain pills, he turned to heroin. Patient reports heroin use for the last 4 years. Patient reports he uses about a quarter or half a gram of heroin daily, and reports drinking 785m a day of vodka. Patient denies ever receiving any outpatient or inpatient treatment in the past for his substance use. Patient reports his current goal is to complete a short term residential program and return home so that he can support his family. Patient reports good support from his wife and four children ages 89 Patient reports he is currently unemployed and does not have any insurance. Patient [facility] however reports being denied due to phenobarbital  taper. Patient reports his goal would be to be referred to their program since it is close to his home. [facility] and will follow up to provide updates as received. Patient expressed understanding and appreciation of LCSW assistance. Patient currently denies any SI/HI/AVH. No other needs were reported at this time by patient.   [place]. LCSW will continue to follow and provide support to patient while on [facility] unit.   JLucius Conn LCSW Clinical Social Worker [facility] Ph: [phone]

## 2022-07-14 NOTE — ED Notes (Signed)
Pt is in the bed sleeping. Respirations are even and unlabored. No acute distress noted. Will continue to monitor for safety. 

## 2022-07-14 NOTE — BH IP Treatment Plan (Signed)
Interdisciplinary Treatment and Diagnostic Plan Update  07/14/2022 Time of Session: 3:23pm MINNIE SHI MRN: [id number]  Diagnosis:  Final diagnoses:  Alcohol use disorder     Current Medications:  Current Facility-Administered Medications  Medication Dose Route Frequency Provider Last Rate Last Admin   acetaminophen (TYLENOL) tablet 650 mg  650 mg Oral Q6H PRN Ardis Hughs, NP       albuterol (VENTOLIN HFA) 108 (90 Base) MCG/ACT inhaler 2 puff  2 puff Inhalation Q6H PRN Ardis Hughs, NP       alum & mag hydroxide-simeth (MAALOX/MYLANTA) 200-200-20 MG/5ML suspension 30 mL  30 mL Oral Q4H PRN Ardis Hughs, NP       [START ON 07/15/2022] amLODipine (NORVASC) tablet 5 mg  5 mg Oral Daily Ardis Hughs, NP       [START ON 07/15/2022] folic acid (FOLVITE) tablet 1 mg  1 mg Oral Daily Ardis Hughs, NP       hydrOXYzine (ATARAX) tablet 25 mg  25 mg Oral Q6H PRN Ardis Hughs, NP       loperamide (IMODIUM) capsule 2-4 mg  2-4 mg Oral PRN Ardis Hughs, NP       LORazepam (ATIVAN) tablet 1 mg  1 mg Oral Q6H PRN Ardis Hughs, NP       magnesium hydroxide (MILK OF MAGNESIA) suspension 30 mL  30 mL Oral Daily PRN Ardis Hughs, NP       [START ON 07/15/2022] multivitamin with minerals tablet 1 tablet  1 tablet Oral Daily Ardis Hughs, NP       nicotine (NICODERM CQ - dosed in mg/24 hours) patch 21 mg  21 mg Transdermal Once Cinderella, Margaret A   21 mg at 07/14/22 1512   nicotine polacrilex (NICORETTE) gum 2 mg  2 mg Oral Q4H PRN Cinderella, Margaret A   2 mg at 07/14/22 1512   ondansetron (ZOFRAN-ODT) disintegrating tablet 4 mg  4 mg Oral Q6H PRN Ardis Hughs, NP       [START ON 07/15/2022] thiamine (VITAMIN B1) tablet 100 mg  100 mg Oral Daily Ardis Hughs, NP       traZODone (DESYREL) tablet 50 mg  50 mg Oral QHS PRN Ardis Hughs, NP       Current Outpatient Medications  Medication Sig Dispense Refill    amLODipine (NORVASC) 5 MG tablet Take 5 mg by mouth daily.     folic acid (FOLVITE) 1 MG tablet Take 1 tablet (1 mg total) by mouth daily. 30 tablet 2   PHENobarbital (LUMINAL) 32.4 MG tablet Take 1 tablet (32.4 mg total) by mouth every 8 (eight) hours for 5 doses. 5 tablet 0   thiamine (VITAMIN B-1) 100 MG tablet Take 1 tablet (100 mg total) by mouth daily. (Patient not taking: Reported on 07/14/2022) 30 tablet 2   PTA Medications: Prior to Admission medications   Medication Sig Start Date End Date Taking? Authorizing Provider  amLODipine (NORVASC) 5 MG tablet Take 5 mg by mouth daily.   Yes [provider]  folic acid (FOLVITE) 1 MG tablet Take 1 tablet (1 mg total) by mouth daily. 07/13/22 10/11/22 Yes Ganta, Anupa, DO  PHENobarbital (LUMINAL) 32.4 MG tablet Take 1 tablet (32.4 mg total) by mouth every 8 (eight) hours for 5 doses. 07/12/22 07/14/22 Yes Ganta, Anupa, DO  thiamine (VITAMIN B-1) 100 MG tablet Take 1 tablet (100 mg total) by mouth daily. Patient not taking: Reported on 07/14/2022  07/13/22 10/11/22  Reece Leader, DO    Patient Stressors: Financial difficulties   Occupational concerns   Substance abuse    Patient Strengths: Ability for insight  Average or above average intelligence  Capable of independent living  Communication skills  General fund of knowledge  Supportive family/friends   Treatment Modalities: Medication Management, Group therapy, Case management,  1 to 1 session with clinician, Psychoeducation, Recreational therapy.   Physician Treatment Plan for Primary and Secondary Diagnosis:  Final diagnoses:  Alcohol use disorder   Long Term Goal(s): Improvement in symptoms so as ready for discharge  Short Term Goals: Patient will verbalize feelings in meetings with treatment team members. Patient will attend at least of 50% of the groups daily. Pt will complete the PHQ9 on admission, day 3 and discharge. Patient will participate in completing the  [place] Suicide Severity Rating Scale Patient will score a low risk of violence for 24 hours prior to discharge Patient will take medications as prescribed daily.  Medication Management: Evaluate patient's response, side effects, and tolerance of medication regimen.  Therapeutic Interventions: 1 to 1 sessions, Unit Group sessions and Medication administration.  Evaluation of Outcomes: Progressing  LCSW Treatment Plan for Primary Diagnosis:  Final diagnoses:  Alcohol use disorder    Long Term Goal(s): Safe transition to appropriate next level of care at discharge.  Short Term Goals: Facilitate acceptance of mental health diagnosis and concerns through verbal commitment to aftercare plan and appointments at discharge., Patient will identify one social support prior to discharge to aid in patient's recovery., Patient will attend AA/NA groups as scheduled., Identify minimum of 2 triggers associated with mental health/substance abuse issues with treatment team members., and Increase skills for wellness and recovery by attending 50% of scheduled groups.  Therapeutic Interventions: Assess for all discharge needs, 1 to 1 time with Child psychotherapist, Explore available resources and support systems, Assess for adequacy in community support network, Educate family and significant other(s) on suicide prevention, Complete Psychosocial Assessment, Interpersonal group therapy.  Evaluation of Outcomes: Progressing   Progress in Treatment: Attending groups: No. Patient is new to [facility] unit and has been encouraged to participate in groups as able.  Participating in groups: No. Patient is new to [facility] unit and has been encouraged to participate in groups as able.  Taking medication as prescribed: Yes. Toleration medication: Yes. Family/Significant other contact made: No, will contact:  Patient's wife Navarre Diana [phone] Patient understands diagnosis: Yes. Discussing patient identified problems/goals with  staff: Yes. Medical problems stabilized or resolved: Yes. Denies suicidal/homicidal ideation: Yes. Issues/concerns per patient self-inventory: Yes. Other: substance use and need for treatment  New problem(s) identified: No, Describe:  other than reported on admission.   New Short Term/Long Term Goal(s): Safe transition to appropriate next level of care at discharge, Engage patient in therapeutic group addressing interpersonal concerns. Engage patient in aftercare planning with referrals and resources, Increase ability to appropriately verbalize feelings, Facilitate acceptance of mental health diagnosis and concerns and Identify triggers associated with mental health/substance abuse issues.    Patient Goals:  Patient expressed interest in 30 day residential placement at [facility].   Discharge Plan or Barriers: LCSW has sent referral for review and will follow up to provide updates as received.   Reason for Continuation of Hospitalization: Medication stabilization Other; describe residential placement   Estimated Length of Stay: 3-5 days  Last 3 [place] Suicide Severity Risk Score: Flowsheet Row ED from 07/14/2022 in [facility] ED to Hosp-Admission (Discharged)  from 07/06/2022 in [place] 5W Medical Specialty PCU ED from 05/22/2021 in [facility] EMERGENCY DEPARTMENT  C-SSRS RISK CATEGORY No Risk No Risk No Risk       Last PHQ 2/9 Scores:    06/17/2021    5:09 PM 04/30/2017    1:47 PM  Depression screen PHQ 2/9  Decreased Interest 1 1  Down, Depressed, Hopeless 1 1  PHQ - 2 Score 2 2  Altered sleeping 2   Tired, decreased energy 2   Change in appetite 1   Feeling bad or failure about yourself  1   Trouble concentrating 0   Moving slowly or fidgety/restless 0   Suicidal thoughts 0   PHQ-9 Score 8   Difficult doing work/chores Somewhat difficult     Scribe for Treatment Team: Lenny Pastel 07/14/2022 3:56 PM

## 2022-07-14 NOTE — ED Notes (Addendum)
Pt requested for Imodium for diarrhea and trazodone to help him sleep.

## 2022-07-14 NOTE — Progress Notes (Signed)
Philip Cuevas was [facility] after [facility] admission process, he was oriented to his new environment and given nourishments. He denied feeling suicidal, depressed and anxious. He is waiting for his 2 hrs Covid test results. He has a large bruise on his left arm from a previous fall from a deck. [facility] was notified at 1403 for an ETA, awaiting a call back from the nurse.

## 2022-07-14 NOTE — Progress Notes (Signed)
Patient has a large bruise on left arm which he received prior to admissio when he fell through a rotted deck.  Skin is intact.

## 2022-07-14 NOTE — BH Assessment (Signed)
Comprehensive Clinical Assessment (CCA) Note  07/14/2022 Philip HartJonathan W Cuevas [id number]  Disposition: Per Vernard Gamblesarolyn Coleman, NP [facility].  Disposition SW to pursue appropriate inpatient options.  The patient demonstrates the following risk factors for suicide: Chronic risk factors for suicide include: substance use disorder. Acute risk factors for suicide include: social withdrawal/isolation and loss (financial, interpersonal, professional). Protective factors for this patient include: positive social support, coping skills, and hope for the future. Considering these factors, the overall suicide risk at this point appears to be low. Patient is appropriate for outpatient follow up.  Patient is a 41 year old male with a history of Opioid Use Disorder, severe and Alcohol Use Disorder, severe who [facility].  Patient presents reporting [facility]. [facility] admitted, patient requried admission to ICU for acute withdrawal. He was Rx precedex and changed to phenobarbital. Patient was discharged on a phenobarbital taper. He was given 5 tablets of the 32.5 dosage and referred to his PCP to continue the taper. Patient denies any substance or alcohol use since he [facility]. He states his last use was before his admission to the hospital. Patient [facility] and was turned away due to the phenobarbital taper he is prescribed. They told him he could be admitted after the taper concludes next Monday. Patient is frustrated, stating he feels he is "being turned away when I'm tryin got get help." He was hoping the go directly to the [facility] 28 day Residenital SA program. He denies SI, HI, or AVH. He denies recent alcohol or drug use.    Chief Complaint: [facility], turned away d/t phenobarbital taper Rx  plan  Visit Diagnosis: Opioid Use Disorder, severe                             Alcohol Use Disorder, severe    CCA Screening, Triage and Referral (STR)  Patient Reported Information How did you hear about us? Self  What Is the Reason for Your Visit/Call Today? Patient presents reporting he was [facility] medical [facility].  Whie admitted, patient requried admission to ICU for acute withdrawal.  He was Rx precedex and changed to phenobarbital.  Patient was discharged on a phenobarbital taper.  He was given 5 tablets of the 32.5 dosage and referred to his PCP to continue the taper.  Patient denies any substance or alcohol use since he was [facility]. He states his last use was before his admission to the hospital. Patient [facility] and was turned away due to the phenobarbital taper he is prescribed.  They told him he could be admitted after the taper concludes next Monday.  Patient is frustrated, stating he feels he is "being turned away when I'm tryin got get help."  He was hoping the go directly to the [facility] 28 day Res[facility] SA program.  He denies SI, HI, or AVH.  He denies recent alcohol or drug use.  How Long Has This Been Causing You Problems? <Week  What Do You Feel Would Help You the Most Today? Alcohol or Drug Use Treatment   Have You Recently Had Any Thoughts About Hurting Yourself? No  Are You Planning to Commit Suicide/Harm Yourself At This time?  No   Flowsheet Row ED to Hosp-Admission (Discharged) from 07/06/2022 in [place] 5W Medical Special[facility] PCU ED from 05/22/2021 in [facility] EM[facility]Y DEPARTMENT  C-SSRS RISK CATEGORY No Risk No Risk       Have you Recently Had Thoughts About Hurting Someone Karolee Ohs? No  Are You Planning to Harm Someone at This Time? No  Explanation: No data recorded  Have You Used Any Alcohol or Drugs in the Past 24 Hours? No  What Did You Use and How Much? 1 beer that  morning before comeing to [facility]   Do You Currently Have a Therapist/Psychiatrist? No  Name of Therapist/Psychiatrist: Name of Therapist/Psychiatrist: N/A   Have You Been Recently Discharged From Any Office Practice or Programs? No  Explanation of Discharge From Practice/Program: No data recorded    CCA Screening Triage Referral Assessment Type of Contact: Face-to-Face  Telemedicine Service Delivery:   Is this Initial or Reassessment?   Date Telepsych consult ordered in [facility]:    Time Telepsych consult ordered in [facility]:    Location of Assessment: [facility]  Provider Location: [facility] Assessment Services   Collateral Involvement: Wife is present   Does Patient Have a Automotive engineer Guardian? No  Legal Guardian Contact Information: N/A  Copy of Legal Guardianship Form: -- (N/A)  Legal Guardian Notified of Arrival: -- (N/A)  Legal Guardian Notified of Pending Discharge: No data recorded If Minor and Not Living with Parent(s), Who has Custody? N/A  Is CPS involved or ever been involved? Never  Is APS involved or ever been involved? Never   Patient Determined To Be At Risk for Harm To Self or Others Based on Review of Patient Reported Information or Presenting Complaint? No  Method: No data recorded Availability of Means: No data recorded Intent: No data recorded Notification Required: No data recorded Additional Information for Danger to Others Potential: No data recorded Additional Comments for Danger to Others Potential: No data recorded Are There Guns or Other Weapons in Your Home? No  Types of Guns/Weapons: No data recorded Are These Weapons Safely Secured?                            No data recorded Who Could Verify You Are Able To Have These Secured: No data recorded Do You Have any Outstanding Charges, Pending Court Dates, Parole/Probation? No data recorded Contacted To Inform of Risk of Harm To Self or Others: No data recorded   Does  Patient Present under Involuntary Commitment? No    [place] of Residence: [place]   Patient Currently Receiving the Following Services: Not Receiving Services   Determination of Need: Urgent (48 hours)   Options For Referral: Facility-Based Crisis     CCA Biopsychosocial Patient Reported Schizophrenia/Schizoaffective Diagnosis in Past: No   Strengths: Seeking treatment, has support   Mental Health Symptoms Depression:   Difficulty Concentrating   Duration of Depressive symptoms:  Duration of Depressive Symptoms: Greater than two weeks   Mania:   None   Anxiety:    Worrying; Tension   Psychosis:   None   Duration of Psychotic symptoms:    Trauma:   None   Obsessions:   None   Compulsions:   None   Inattention:   N/A   Hyperactivity/Impulsivity:   N/A   Oppositional/Defiant Behaviors:   N/A   Emotional Irregularity:   None   Other Mood/Personality Symptoms:  No data recorded  Mental Status Exam Appearance and self-care  Stature:   Average   Weight:   Average weight   Clothing:  No data recorded  Grooming:   Normal   Cosmetic use:   None   Posture/gait:   Normal   Motor activity:   Not Remarkable   Sensorium  Attention:   Normal   Concentration:   Normal   Orientation:   X5   Recall/memory:   Normal   Affect and Mood  Affect:   Appropriate   Mood:   Anxious   Relating  Eye contact:   Normal   Facial expression:   Responsive   Attitude toward examiner:   Cooperative   Thought and Language  Speech flow:  Clear and Coherent   Thought content:   Appropriate to Mood and Circumstances   Preoccupation:   None   Hallucinations:   None   Organization:   Linear; Logical; Intact   Company secretary of Knowledge:   Average   Intelligence:   Average   Abstraction:   Normal   Judgement:   Fair   Dance movement psychotherapist:   Adequate   Insight:   Gaps   Decision Making:   Normal;  Vacilates   Social Functioning  Social Maturity:   Responsible   Social Judgement:   Normal   Stress  Stressors:   Family conflict; Relationship; Financial   Coping Ability:   Exhausted   Skill Deficits:   Communication; Interpersonal; Self-control   Supports:   Family; Friends/Service system     Religion: Religion/Spirituality Are You A Religious Person?: No  Leisure/Recreation: Leisure / Recreation Do You Have Hobbies?: No  Exercise/Diet: Exercise/Diet Do You Exercise?: No Have You Gained or Lost A Significant Amount of Weight in the Past Six Months?: No Do You Follow a Special Diet?: No Do You Have Any Trouble Sleeping?: No   CCA Employment/Education Employment/Work Situation: Employment / Work Situation Employment Situation: Unemployed Has Patient ever Been in Equities trader?: No  Education: Education Is Patient Currently Attending School?: No Last Grade Completed: 12 Did You Product manager?: No Did You Have An Individualized Education Program (IIEP): No Did You Have Any Difficulty At [employer]?: No Patient's Education Has Been Impacted by Current Illness: No   CCA Family/Childhood History Family and Relationship History: Family history Marital status: Married Number of Years Married:  (NA) What types of issues is patient dealing with in the relationship?: None reported Does patient have children?:  (NA)  Childhood History:  Childhood History By whom was/is the patient raised?: Mother, Father Did patient suffer any verbal/emotional/physical/sexual abuse as a child?: No Did patient suffer from severe childhood neglect?: No Has patient ever been sexually abused/assaulted/raped as an adolescent or adult?: No Was the patient ever a victim of a crime or a disaster?: No Witnessed domestic violence?: No Has patient been affected by domestic violence as an adult?: No       CCA Substance Use Alcohol/Drug Use: Alcohol / Drug Use Pain Medications:  See MAR Prescriptions: See MAR Over the Counter: See MAR History of alcohol / drug use?: Yes Longest period of sobriety (when/how long): NA Negative Consequences of Use: Personal relationships Withdrawal Symptoms: None Substance #1 Name of Substance 1: ETOH 1 - Age of First Use: Unknown 1 - Amount (size/oz): varies 1 - Frequency: hx of daily use prior to hospital admit on 11/13 for detox 1 - Duration: "years" 1 - Last Use / Amount: 07/06/22 - 1 beer 1 - Method  of Aquiring: NA 1- Route of Use: NA Substance #2 Name of Substance 2: opiates 2 - Age of First Use: NA 2 - Amount (size/oz): Unknown 2 - Frequency: daily prior to stopping cold [place] on 11/4 2 - Duration: "years" 2 - Last Use / Amount: 11/4 2 - Method of Aquiring: NA 2 - Route of Substance Use: NA                     ASAM's:  Six Dimensions of Multidimensional Assessment  Dimension 1:  Acute Intoxication and/or Withdrawal Potential:   Dimension 1:  Description of individual's past and current experiences of substance use and withdrawal: No signs of intoxication or w/d  Dimension 2:  Biomedical Conditions and Complications:   Dimension 2:  Description of patient's biomedical conditions and  complications: ablel to cope with discomfort/pain  Dimension 3:  Emotional, Behavioral, or Cognitive Conditions and Complications:  Dimension 3:  Description of emotional, behavioral, or cognitive conditions and complications: Underlying depression  Dimension 4:  Readiness to Change:  Dimension 4:  Description of Readiness to Change criteria: Here seeking admission to 28 day SA treatment  Dimension 5:  Relapse, Continued use, or Continued Problem Potential:  Dimension 5:  Relapse, continued use, or continued problem potential critiera description: Hx of relapse - appears motivated towards sobriety  Dimension 6:  Recovery/Living Environment:  Dimension 6:  Recovery/Iiving environment criteria description: wife is supportive  ASAM  Severity Score: ASAM's Severity Rating Score: 3  ASAM Recommended Level of Treatment: ASAM Recommended Level of Treatment: Level III Residential Treatment   Substance use Disorder (SUD) Substance Use Disorder (SUD)  Checklist Symptoms of Substance Use: Social, occupational, recreational activities given up or reduced due to use, Recurrent use that results in a failure to fulfill major role obligations (work, school, home)  Recommendations for Services/Supports/Treatments: Recommendations for Services/Supports/Treatments Recommendations For Services/Supports/Treatments: Facility Based Crisis  Discharge Disposition:    DSM5 Diagnoses: Patient Active Problem List   Diagnosis Date Noted   Alcohol use disorder 07/14/2022   Narcotic withdrawal (HCC) 07/12/2022   Tachycardia 07/11/2022   Polysubstance abuse (HCC) 07/10/2022   Alcohol withdrawal delirium (HCC) 07/07/2022   Alcohol withdrawal (HCC) 07/06/2022   Throat pain 07/06/2022   Poor social situation 07/06/2022   Opioid use disorder 07/06/2022   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   History of migraine headaches 08/29/2018   Venous stasis dermatitis of both lower extremities 12/22/2017   Morbid obesity (HCC) 04/30/2017   Obstructive sleep apnea 04/30/2017   Hypertriglyceridemia 03/03/2017   Chronic epigastric pain 03/02/2017   Left knee pain with intra-articular loose body 05/02/2012   HTN (hypertension) 05/07/2008   Erectile dysfunction 07/06/2007   ASTHMA, EXERCISE INDUCED 11/24/2006   Insomnia 06/04/2006   Anxiety state 06/01/2006     Referrals to Alternative Service(s): Referred to Alternative Service(s):   Place:   Date:   Time:    Referred to Alternative Service(s):   Place:   Date:   Time:    Referred to Alternative Service(s):   Place:   Date:   Time:    Referred to Alternative Service(s):   Place:   Date:   Time:     Yetta Glassman, [facility]

## 2022-07-14 NOTE — ED Notes (Signed)
Pt came to nurse and complained that he is still unable to sleep. Provider notified.

## 2022-07-14 NOTE — ED Notes (Signed)
Pt requested for medication to help him sleep.

## 2022-07-14 NOTE — ED Provider Notes (Signed)
Facility Based Crisis Admission H&P  Date: 07/14/22 Patient Name: JAFARI MORRICAL MRN: [id number] Chief Complaint: No chief complaint on file.     Diagnoses:  Final diagnoses:  Alcohol use disorder    HPI: Devvin Zwicker Karch 41 y.o., male patient presented to [facility] as a walk in accompanied by his spouse with complaints of, "I went to [facility] this morning and they rejected me due to taking my last pill of phenobarbital".  Akul Presser Semper, 41 y.o., male patient seen face to face by this provider, consulted with Dr. Lovette Cliche; and chart reviewed on 07/14/22.  Per chart review patient previously presented to [facility] requesting alcohol detox on 07/06/2022 and was transferred to the [facility] emergency department for medical clearance.  He was medically admitted from 07/06/2022-07/12/2022 and was admitted to ICU during that time.  There is no documentation of any alcohol withdrawal seizures.  However there is documentation of delirium.  Patient was discharged home with his spouse where he has been supervised by her since his hospital discharge.  He was discharged on a phenobarbital taper that ended this a.m.  He denies any substance or alcohol use, states his last use was before his admission to the hospital.   Drema Pry Ariola reports after he was discharged from [facility] emergency department he went home with his spouse where she has supervised him 24/7.  He is adamant that he has not taken any substances.  He is denying any alcohol withdrawal symptoms.  He does have a small fine hand tremor which he states is normal for him.  He appears to be motivated to maintain his sobriety.  However he does not feel that he can maintain his sobriety on his own.  He states, "at night I think a lot about drinking alcohol, I do not want to but it is what I think about and I am afraid I am going to drink again".   During evaluation Slate Boedigheimer Howser is observed sitting in the assessment room in no  acute distress.  He is alert/oriented x 4, calm, cooperative, and attentive.  He is casually dressed and makes good eye contact.  He has normal speech and behavior.  He is denying any depressive symptoms and has a euthymic affect.  He denies SI/HI/AVH.  He does not appear to be responding to internal/external stimuli.  Patient remained calm and answered questions appropriately.    Discussed facility base crisis admission with patient.  He is in agreement.  Explained to patient that if he were to experience any alcohol withdrawal symptoms including confusion or hallucinations to notify staff immediately.  He verbalizes understanding.  CIWA protocol will be ordered as a precautionary measure.  Case was consulted with Dr. Lovette Cliche and due to patient already completing detox in the hospital he is medically stable at this time to be admitted to the facility base crisis unit while awaiting bed availability at [facility] residential.   [place] 2-9:  [place] Office Visit from 06/17/2021 in [place]  Thoughts that you would be better off dead, or of hurting yourself in some way Not at all  PHQ-9 Total Score 8       Flowsheet Row ED to Hosp-Admission (Discharged) from 07/06/2022 in [place] [facility] ED from 05/22/2021 in [place] No Risk No Risk        Total Time spent with patient: 30 minutes  Musculoskeletal  Strength & Muscle Tone: within normal limits Gait & Station: normal Patient leans: N/A  Psychiatric Specialty Exam  Presentation General Appearance:  Appropriate for Environment; Casual  Eye Contact: Good  Speech: Clear and Coherent; Normal Rate  Speech Volume: Normal  Handedness: Right   Mood and Affect  Mood: Euthymic  Affect: Congruent   Thought Process  Thought Processes: Coherent  Descriptions of Associations:Intact  Orientation:Full (Time, Place  and Person)  Thought Content:Logical    Hallucinations:Hallucinations: None  Ideas of Reference:None  Suicidal Thoughts:Suicidal Thoughts: No  Homicidal Thoughts:Homicidal Thoughts: No   Sensorium  Memory: Immediate Good; Recent Good; Remote Good  Judgment: Good  Insight: Good   Executive Functions  Concentration: Good  Attention Span: Good  Recall: Good  Fund of Knowledge: Good  Language: Good   Psychomotor Activity  Psychomotor Activity: Psychomotor Activity: Normal   Assets  Assets: Communication Skills; Desire for Improvement; Intimacy; Physical Health; Resilience; Social Support   Sleep  Sleep: Sleep: Good   No data recorded  Physical Exam Vitals and nursing note reviewed.  Constitutional:      General: He is not in acute distress.    Appearance: Normal appearance. He is well-developed.  Eyes:     General:        Right eye: No discharge.        Left eye: No discharge.     Conjunctiva/sclera: Conjunctivae normal.  Cardiovascular:     Rate and Rhythm: Tachycardia present.  Pulmonary:     Effort: Pulmonary effort is normal. No respiratory distress.  Musculoskeletal:        General: Normal range of motion.     Cervical back: Normal range of motion.  Skin:    Coloration: Skin is not jaundiced or pale.  Neurological:     Mental Status: He is alert and oriented to person, place, and time.  Psychiatric:        Attention and Perception: Attention and perception normal.        Mood and Affect: Mood normal.        Speech: Speech normal.        Behavior: Behavior normal. Behavior is cooperative.        Thought Content: Thought content normal.        Cognition and Memory: Cognition normal.        Judgment: Judgment normal.    Review of Systems  Constitutional: Negative.   HENT: Negative.    Eyes: Negative.   Respiratory: Negative.    Cardiovascular: Negative.   Gastrointestinal: Negative.   Musculoskeletal: Negative.   Skin:  Negative.   Neurological:  Positive for tremors (very fine hand tremor).  Endo/Heme/Allergies:  Does not bruise/bleed easily.  Psychiatric/Behavioral: Negative.      Blood pressure (!) 137/99, pulse (!) 110, temperature (!) 97.5 F (36.4 C), temperature source Oral, resp. rate 18, SpO2 100 %. There is no height or weight on file to calculate BMI.  Past Psychiatric History: Polysubstance abuse (alcohol and opioid use) alcohol use with complication (delirium), insomnia, anxiety  Is the patient at risk to self? No  Has the patient been a risk to self in the past 6 months? No .    Has the patient been a risk to self within the distant past? No   Is the patient a risk to others? No   Has the patient been a risk to others in the past 6 months? No   Has the patient been a risk to others  within the distant past? No   Past Medical History:  Past Medical History:  Diagnosis Date   Anxiety    Asthma    mild, intermittent   Depression    Hypertension    Knee injury     Past Surgical History:  Procedure Laterality Date   DENTAL SURGERY     PILONIDAL CYST EXCISION N/A 04/12/2015   Procedure: CYST EXCISION PILONIDAL SIMPLE;  Surgeon: Franky Macho, MD;  Location: [facility];  Service: General;  Laterality: N/A;   TONSILECTOMY, ADENOIDECTOMY, BILATERAL MYRINGOTOMY AND TUBES      Family History:  Family History  Problem Relation Age of Onset   Hypertension Mother    Hyperlipidemia Mother    Depression Father        committed suicide    Social History:  Social History   Socioeconomic History   Marital status: Married    Spouse name: Not on file   Number of children: Not on file   Years of education: Not on file   Highest education level: Not on file  Occupational History   Not on file  Tobacco Use   Smoking status: Some Days    Packs/day: 0.30    Years: 4.00    Total pack years: 1.20    Types: Cigarettes    Last attempt to quit: 10/22/2005    Years since quitting: 16.7    Smokeless tobacco: Never  Substance and Sexual Activity   Alcohol use: Yes    Comment: 2 drinks a day   Drug use: Yes    Comment: snorts heroin   Sexual activity: Not on file    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      Allergies: Patient has no known allergies.  PTA Medications: (Not in a hospital admission)   Long Term Goals: Improvement in symptoms so as ready for discharge  Short Term Goals: Patient will verbalize feelings in meetings with treatment team members., Patient will attend at least of 50% of the groups daily., Pt will complete the PHQ9 on admission, day 3 and discharge., Patient will participate in completing the [place] Suicide Severity Rating Scale, Patient will score a low risk of violence for 24 hours prior to discharge, and Patient will take medications as prescribed daily.  Medical Decision Making  Patient presents to [facility] after a recent discharge from [facility] related to alcohol withdrawal with complication (delirium).  He was denied from [facility] residential treatment center this a.m. due to taking his last phenobarbital today.  [facility] would not reconsider his admission until Monday 11/2 7.  Patient feels he is unable to maintain his sobriety during this waiting period and is requesting to be admitted to the facility based crisis unit.  Patient will be admitted to  the [facility] to help prevent relapse and promote a positive patient outcome.  Recommendations  Based on my evaluation the patient does not appear to have an emergency medical condition.  Admit to the facility base crisis unit.  Social work will be notified that patient is seeking residential substance abuse treatment at [facility].  CIWA protocol ordered as a precautionary measure due to recent hospitalization related to alcohol withdrawal.  Lab Orders         Resp Panel by RT-PCR (Flu A&B, Covid) Anterior Nasal Swab         CBC with Differential/Platelet         Comprehensive metabolic panel         Hemoglobin A1c         Magnesium         Ethanol         Lipid panel         TSH         POCT Urine Drug Screen - (I-Screen)         POC SARS Coronavirus 2 Ag-ED - Nasal Swab       EKG ordered    Revonda Humphrey, NP 07/14/22  11:08 AM

## 2022-07-15 DIAGNOSIS — Z1152 Encounter for screening for COVID-19: Secondary | ICD-10-CM | POA: Diagnosis not present

## 2022-07-15 DIAGNOSIS — F1090 Alcohol use, unspecified, uncomplicated: Secondary | ICD-10-CM | POA: Diagnosis not present

## 2022-07-15 DIAGNOSIS — R251 Tremor, unspecified: Secondary | ICD-10-CM | POA: Diagnosis not present

## 2022-07-15 MED ORDER — GABAPENTIN 300 MG PO CAPS
300.0000 mg | ORAL_CAPSULE | Freq: Three times a day (TID) | ORAL | Status: DC
  Administered 2022-07-15 – 2022-07-16 (×2): 300 mg via ORAL
  Filled 2022-07-15 (×2): qty 1

## 2022-07-15 MED ORDER — VENLAFAXINE HCL ER 37.5 MG PO CP24
37.5000 mg | ORAL_CAPSULE | Freq: Every day | ORAL | Status: DC
  Administered 2022-07-16 – 2022-07-17 (×2): 37.5 mg via ORAL
  Filled 2022-07-15 (×2): qty 1
  Filled 2022-07-15: qty 14

## 2022-07-15 MED ORDER — TRAZODONE HCL 50 MG PO TABS
50.0000 mg | ORAL_TABLET | ORAL | Status: AC
  Administered 2022-07-15: 50 mg via ORAL

## 2022-07-15 MED ORDER — GABAPENTIN 600 MG PO TABS
300.0000 mg | ORAL_TABLET | Freq: Three times a day (TID) | ORAL | Status: DC
  Administered 2022-07-15: 300 mg via ORAL
  Filled 2022-07-15: qty 1

## 2022-07-15 NOTE — ED Provider Notes (Signed)
[facility] Crisis Behavioral Health Progress Note  Date & Time: 07/15/2022 7:56 PM Name: Philip Cuevas Age: 41 y.o.  DOB: 10-21-1980  MRN: [id number]  Diagnosis:  Final diagnoses:  Alcohol use disorder    Reason for presentation: No chief complaint on file.  Brief HPI  Philip Cuevas is a 41 y.o. male, with PMH of AUD (h/o DT, no sz), OUD, HTN, who presented voluntary to [facility] (07/14/2022) with spouse then admitted to [facility] as a referral from [facility] due to patient being on phenobarbitol taper and assistance with maintaining sobriety before admission to [facility]. Last drink was prior to hospital admission for medical detox in the ICU from 07/06/2022-07/12/2022.  Completed phenobarbitol taper on 11/21  Interval Hx   Patient Narrative:   Feeling very anxious and having EtOH craving. He has been on prozac x1 year and zoloft for a few months in the past to good effect, however did not like the blunting side effect. He was amenable to starting effexor for depression and anxiety. Amenable to starting gabapentin for anxiety and craving. He tremors have been present from before the detox, denied new tremor as withdrawal sxs. He agreed to inform staff if he experiences any AVH or confusion, so he can be transferred to the ED given his h/o DT.   Suicidal Thoughts: No (Contracted to safety) Homicidal Thoughts: No Hallucinations: None (Denied AVH)  Mood: Anxious Sleep:Poor Appetite: Poor, improving  Review of Systems  Respiratory:  Positive for shortness of breath.   Cardiovascular:  Negative for chest pain.  Gastrointestinal:  Negative for nausea and vomiting.  Neurological:  Positive for tremors. Negative for dizziness and headaches.     Past History   Psychiatric History: See H&P  Psychiatric Family History: See H&P  Social History:  See H&P  Past Medical History:  Past Medical History:  Diagnosis Date   Anxiety    Asthma    mild, intermittent    Depression    Hypertension    Knee injury     Past Surgical History:  Procedure Laterality Date   DENTAL SURGERY     PILONIDAL CYST EXCISION N/A 04/12/2015   Procedure: CYST EXCISION PILONIDAL SIMPLE;  Surgeon: Franky Macho, MD;  Location: [facility];  Service: General;  Laterality: N/A;   TONSILECTOMY, ADENOIDECTOMY, BILATERAL MYRINGOTOMY AND TUBES     Family History:  Family History  Problem Relation Age of Onset   Hypertension Mother    Hyperlipidemia Mother    Depression Father        committed suicide   Social History   Substance and Sexual Activity  Alcohol Use Yes   Comment: 2 drinks a day    Social History   Substance and Sexual Activity  Drug Use Yes   Comment: snorts heroin    Social History   Socioeconomic History   Marital status: Married    Spouse name: Not on file   Number of children: Not on file   Years of education: Not on file   Highest education level: Not on file  Occupational History   Not on file  Tobacco Use   Smoking status: Some Days    Packs/day: 0.30    Years: 4.00    Total pack years: 1.20    Types: Cigarettes    Last attempt to quit: 10/22/2005    Years since quitting: 16.7   Smokeless tobacco: Never  Substance and Sexual Activity   Alcohol use: Yes    Comment: 2  drinks a day   Drug use: Yes    Comment: snorts heroin   Sexual activity: Not on file    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, 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, 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  Other Topics Concern   Not on file  Social History Narrative   Not on file   Social Determinants of Health   Financial Resource Strain: Not on file  Food Insecurity: Not on file  Transportation Needs: Not on file  Physical Activity: Not on file  Stress: Not on file  Social Connections: Not on file   SDOH: SDOH Screenings   Depression (PHQ2-9): Medium Risk (06/17/2021)  Tobacco Use: High Risk (07/14/2022)   Additional Social History: Alcohol / Drug Use Pain Medications: See  MAR Prescriptions: See MAR Over the Counter: See MAR History of alcohol / drug use?: Yes Longest period of sobriety (when/how long): NA Negative Consequences of Use: Personal relationships Withdrawal Symptoms: None Substance #1 Name of Substance 1: ETOH 1 - Age of First Use: Unknown 1 - Amount (size/oz): varies 1 - Frequency: hx of daily use prior to hospital admit on 11/13 for detox 1 - Duration: "years" 1 - Last Use / Amount: 07/06/22 - 1 beer 1 - Method of Aquiring: NA 1- Route of Use: NA Substance #2 Name of Substance 2: opiates 2 - Age of First Use: NA 2 - Amount (size/oz): Unknown 2 - Frequency: daily prior to stopping cold [place] on 11/4 2 - Duration: "years" 2 - Last Use / Amount: 11/4 2 - Method of Aquiring: NA 2 - Route of Substance Use: NA Current Medications   Current Facility-Administered Medications  Medication Dose Route Frequency Provider Last Rate Last Admin   acetaminophen (TYLENOL) tablet 650 mg  650 mg Oral Q6H PRN Ardis Hughs, NP   650 mg at 07/14/22 2340   albuterol (VENTOLIN HFA) 108 (90 Base) MCG/ACT inhaler 2 puff  2 puff Inhalation Q6H PRN Ardis Hughs, NP       alum & mag hydroxide-simeth (MAALOX/MYLANTA) 200-200-20 MG/5ML suspension 30 mL  30 mL Oral Q4H PRN Ardis Hughs, NP       amLODipine (NORVASC) tablet 5 mg  5 mg Oral Daily Vernard Gambles H, NP   5 mg at 07/15/22 0913   folic acid (FOLVITE) tablet 1 mg  1 mg Oral Daily Ardis Hughs, NP   1 mg at 07/15/22 0913   gabapentin (NEURONTIN) capsule 300 mg  300 mg Oral TID Cinderella, Margaret A       hydrOXYzine (ATARAX) tablet 25 mg  25 mg Oral Q6H PRN Ardis Hughs, NP   25 mg at 07/14/22 2225   loperamide (IMODIUM) capsule 2-4 mg  2-4 mg Oral PRN Ardis Hughs, NP   2 mg at 07/15/22 0932   LORazepam (ATIVAN) tablet 1 mg  1 mg Oral Q6H PRN Ardis Hughs, NP       magnesium hydroxide (MILK OF MAGNESIA) suspension 30 mL  30 mL Oral Daily PRN Ardis Hughs,  NP       multivitamin with minerals tablet 1 tablet  1 tablet Oral Daily Ardis Hughs, NP   1 tablet at 07/15/22 0912   nicotine (NICODERM CQ - dosed in mg/24 hours) patch 21 mg  21 mg Transdermal Daily Ardis Hughs, NP   21 mg at 07/15/22 0913   nicotine polacrilex (NICORETTE) gum 2 mg  2 mg Oral Q4H PRN Cinderella, Young Berry  2 mg at 07/15/22 1739   ondansetron (ZOFRAN-ODT) disintegrating tablet 4 mg  4 mg Oral Q6H PRN Ardis Hughs, NP       thiamine (VITAMIN B1) tablet 100 mg  100 mg Oral Daily Ardis Hughs, NP   100 mg at 07/15/22 0912   traZODone (DESYREL) tablet 50 mg  50 mg Oral QHS PRN Ardis Hughs, NP   50 mg at 07/14/22 2108   [START ON 07/16/2022] venlafaxine XR (EFFEXOR-XR) 24 hr capsule 37.5 mg  37.5 mg Oral Q breakfast Princess Bruins, DO       Current Outpatient Medications  Medication Sig Dispense Refill   amLODipine (NORVASC) 5 MG tablet Take 5 mg by mouth daily.     folic acid (FOLVITE) 1 MG tablet Take 1 tablet (1 mg total) by mouth daily. 30 tablet 2   PHENobarbital (LUMINAL) 32.4 MG tablet Take 1 tablet (32.4 mg total) by mouth every 8 (eight) hours for 5 doses. 5 tablet 0   thiamine (VITAMIN B-1) 100 MG tablet Take 1 tablet (100 mg total) by mouth daily. (Patient not taking: Reported on 07/14/2022) 30 tablet 2    Labs / Images  Lab Results:  Admission on 07/14/2022  Component Date Value Ref Range Status   SARS Coronavirus 2 by RT PCR 07/14/2022 NEGATIVE  NEGATIVE Final   Comment: (NOTE) SARS-CoV-2 target nucleic acids are NOT DETECTED.  The SARS-CoV-2 RNA is generally detectable in upper respiratory specimens during the acute phase of infection. The lowest concentration of SARS-CoV-2 viral copies this assay can detect is 138 copies/mL. A negative result does not preclude SARS-Cov-2 infection and should not be used as the sole basis for treatment or other patient management decisions. A negative result may occur with  improper specimen  collection/handling, submission of specimen other than nasopharyngeal swab, presence of viral mutation(s) within the areas targeted by this assay, and inadequate number of viral copies(<138 copies/mL). A negative result must be combined with clinical observations, patient history, and epidemiological information. The expected result is Negative.  Fact Sheet for Patients:  BloggerCourse.com  Fact Sheet for Healthcare Providers:  SeriousBroker.it  This test is no                          t yet approved or cleared by the [place] FDA and  has been authorized for detection and/or diagnosis of SARS-CoV-2 by FDA under an Emergency Use Authorization (EUA). This EUA will remain  in effect (meaning this test can be used) for the duration of the COVID-19 declaration under Section 564(b)(1) of the Act, 21 U.S.C.section 360bbb-3(b)(1), unless the authorization is terminated  or revoked sooner.       Influenza A by PCR 07/14/2022 NEGATIVE  NEGATIVE Final   Influenza B by PCR 07/14/2022 NEGATIVE  NEGATIVE Final   Comment: (NOTE) The Xpert Xpress SARS-CoV-2/FLU/RSV plus assay is intended as an aid in the diagnosis of influenza from Nasopharyngeal swab specimens and should not be used as a sole basis for treatment. Nasal washings and aspirates are unacceptable for Xpert Xpress SARS-CoV-2/FLU/RSV testing.  Fact Sheet for Patients: BloggerCourse.com  Fact Sheet for Healthcare Providers: SeriousBroker.it  This test is not yet approved or cleared by the [place] FDA and has been authorized for detection and/or diagnosis of SARS-CoV-2 by FDA under an Emergency Use Authorization (EUA). This EUA will remain in effect (meaning this test can be used) for the duration of the COVID-19 declaration under  Section 564(b)(1) of the Act, 21 U.S.C. section 360bbb-3(b)(1), unless the authorization is  terminated or revoked.  Performed at [facility], [address] [place], [place] [address]    WBC 07/14/2022 5.2  4.0 - 10.5 K/uL Final   RBC 07/14/2022 4.18 (L)  4.22 - 5.81 MIL/uL Final   Hemoglobin 07/14/2022 14.9  13.0 - 17.0 g/dL Final   HCT 23/53/6144 42.1  39.0 - 52.0 % Final   MCV 07/14/2022 100.7 (H)  80.0 - 100.0 fL Final   MCH 07/14/2022 35.6 (H)  26.0 - 34.0 pg Final   MCHC 07/14/2022 35.4  30.0 - 36.0 g/dL Final   RDW 31/54/0086 14.0  11.5 - 15.5 % Final   Platelets 07/14/2022 356  150 - 400 K/uL Final   nRBC 07/14/2022 0.0  0.0 - 0.2 % Final   Neutrophils Relative % 07/14/2022 69  % Final   Neutro Abs 07/14/2022 3.6  1.7 - 7.7 K/uL Final   Lymphocytes Relative 07/14/2022 20  % Final   Lymphs Abs 07/14/2022 1.0  0.7 - 4.0 K/uL Final   Monocytes Relative 07/14/2022 8  % Final   Monocytes Absolute 07/14/2022 0.4  0.1 - 1.0 K/uL Final   Eosinophils Relative 07/14/2022 1  % Final   Eosinophils Absolute 07/14/2022 0.1  0.0 - 0.5 K/uL Final   Basophils Relative 07/14/2022 1  % Final   Basophils Absolute 07/14/2022 0.1  0.0 - 0.1 K/uL Final   Immature Granulocytes 07/14/2022 1  % Final   Abs Immature Granulocytes 07/14/2022 0.07  0.00 - 0.07 K/uL Final   Performed at [facility], [address], [place], [place] [address]   Sodium 07/14/2022 138  135 - 145 mmol/L Final   Potassium 07/14/2022 4.1  3.5 - 5.1 mmol/L Final   Chloride 07/14/2022 103  98 - 111 mmol/L Final   CO2 07/14/2022 24  22 - 32 mmol/L Final   Glucose, Bld 07/14/2022 101 (H)  70 - 99 mg/dL Final   Glucose reference range applies only to samples taken after fasting for at least 8 hours.   BUN 07/14/2022 8  6 - 20 mg/dL Final   Creatinine, Ser 07/14/2022 0.76  0.61 - 1.24 mg/dL Final   Calcium 09/32/6712 9.9  8.9 - 10.3 mg/dL Final   Total Protein 45/80/9983 6.5  6.5 - 8.1 g/dL Final   Albumin 38/25/0539 3.9  3.5 - 5.0 g/dL Final   AST 76/73/4193 63 (H)  15 - 41 U/L Final   ALT 07/14/2022  85 (H)  0 - 44 U/L Final   Alkaline Phosphatase 07/14/2022 73  38 - 126 U/L Final   Total Bilirubin 07/14/2022 0.9  0.3 - 1.2 mg/dL Final   GFR, Estimated 07/14/2022 >60  >60 mL/min Final   Comment: (NOTE) Calculated using the CKD-EPI Creatinine Equation (2021)    Anion gap 07/14/2022 11  5 - 15 Final   Performed at [facility], [address], [place], [place] [address]   Hgb A1c MFr Bld 07/14/2022 5.4  4.8 - 5.6 % Final   Comment: (NOTE) Pre diabetes:          5.7%-6.4%  Diabetes:              >6.4%  Glycemic control for   <7.0% adults with diabetes    Mean Plasma Glucose 07/14/2022 108.28  mg/dL Final   Performed at [facility], [address] [place], [place] [address]  Magnesium 07/14/2022 2.3  1.7 - 2.4 mg/dL Final   Performed at [facility], [address] [place], [place] [address]   Alcohol, Ethyl (B) 07/14/2022 <10  <10 mg/dL Final   Comment: (NOTE) Lowest detectable limit for serum alcohol is 10 mg/dL.  For medical purposes only. Performed at [facility], [address] [place], [place] [address]    Cholesterol 07/14/2022 199  0 - 200 mg/dL Final   Triglycerides 21/22/4825 180 (H)  <150 mg/dL Final   HDL 00/37/0488 40 (L)  >40 mg/dL Final   Total CHOL/HDL Ratio 07/14/2022 5.0  RATIO Final   VLDL 07/14/2022 36  0 - 40 mg/dL Final   LDL Cholesterol 07/14/2022 123 (H)  0 - 99 mg/dL Final   Comment:        Total Cholesterol/HDL:CHD Risk Coronary Heart Disease Risk Table                     Men   Women  1/2 Average Risk   3.4   3.3  Average Risk       5.0   4.4  2 X Average Risk   9.6   7.1  3 X Average Risk  23.4   11.0        Use the calculated Patient Ratio above and the CHD Risk Table to determine the patient's CHD Risk.        ATP III CLASSIFICATION (LDL):  <100     mg/dL   Optimal  891-694  mg/dL   Near or Above                    Optimal  130-159  mg/dL   Borderline  503-888  mg/dL   High  >280     mg/dL   Very  High Performed at [facility], [address] [place], [place] [address]    TSH 07/14/2022 1.785  0.350 - 4.500 uIU/mL Final   Comment: Performed by a 3rd Generation assay with a functional sensitivity of <=0.01 uIU/mL. Performed at [facility], [address], [place], [place] [address]    POC Amphetamine UR 07/14/2022 None Detected  NONE DETECTED (Cut Off Level 1000 ng/mL) Final   POC Secobarbital (BAR) 07/14/2022 Positive (A)  NONE DETECTED (Cut Off Level 300 ng/mL) Final   POC Buprenorphine (BUP) 07/14/2022 Positive (A)  NONE DETECTED (Cut Off Level 10 ng/mL) Final   POC Oxazepam (BZO) 07/14/2022 Positive (A)  NONE DETECTED (Cut Off Level 300 ng/mL) Final   POC Cocaine UR 07/14/2022 None Detected  NONE DETECTED (Cut Off Level 300 ng/mL) Final   POC Methamphetamine UR 07/14/2022 None Detected  NONE DETECTED (Cut Off Level 1000 ng/mL) Final   POC Morphine 07/14/2022 None Detected  NONE DETECTED (Cut Off Level 300 ng/mL) Final   POC Methadone UR 07/14/2022 None Detected  NONE DETECTED (Cut Off Level 300 ng/mL) Final   POC Oxycodone UR 07/14/2022 None Detected  NONE DETECTED (Cut Off Level 100 ng/mL) Final   POC Marijuana UR 07/14/2022 None Detected  NONE DETECTED (Cut Off Level 50 ng/mL) Final   SARS Coronavirus 2 Ag 07/14/2022 Negative  Negative Final  No results displayed because visit has over 200 results.     Blood Alcohol level:  Lab Results  Component Value Date   [facility] <10 07/14/2022   ETH <10 07/06/2022   Metabolic Disorder Labs: Lab Results  Component Value Date  HGBA1C 5.4 07/14/2022   MPG 108.28 07/14/2022   MPG 103 06/17/2021   No results found for: "PROLACTIN" Lab Results  Component Value Date   CHOL 199 07/14/2022   TRIG 180 (H) 07/14/2022   HDL 40 (L) 07/14/2022   CHOLHDL 5.0 07/14/2022   VLDL 36 07/14/2022   LDLCALC 123 (H) 07/14/2022   LDLCALC  06/17/2021     Comment:     . LDL cholesterol not calculated. Triglyceride levels greater than  400 mg/dL invalidate calculated LDL results. . Reference range: <100 . Desirable range <100 mg/dL for primary prevention;   <70 mg/dL for patients with CHD or diabetic patients  with > or = 2 CHD risk factors. Marland Kitchen. LDL-C is now calculated using the Martin-Hopkins  calculation, which is a validated novel method providing  better accuracy than the Friedewald equation in the  estimation of LDL-C.  Horald PollenMartin SS et al. Lenox AhrJAMA. 2841;324(402013;310(19): 2061-2068  (http://education.[employer].com/faq/FAQ164)    Therapeutic Lab Levels: No results found for: "LITHIUM" No results found for: "VALPROATE" No results found for: "CBMZ" Physical Findings   GAD-7    Flowsheet Row [facility] [place]  Total GAD-7 Score 14      PHQ2-9    Flowsheet Row [facility] [place] [facility] [place]  PHQ-2 Total Score 2 2  PHQ-9 Total Score 8 --      Flowsheet Row [facility] (Discharged) from [facility] from 05/22/2021 in [facility]PARTMENT  C-SSRS RISK CATEGORY No Risk No Risk No Risk       Musculoskeletal  Strength & Muscle Tone: within normal limits Gait & Station: normal Patient leans: N/A   Psychiatric Specialty Exam   Presentation  General Appearance:Appropriate for Environment, Casual, Fairly Groomed Eye Contact:Fair Speech:Clear and Coherent, Normal Rate Volume:Normal Handedness:Right  Mood and Affect  Mood:Anxious Affect:Appropriate, Congruent, Full Range  Thought Process  Thought Process:Coherent, Goal Directed Descriptions of Associations:Circumstantial  Thought Content Suicidal Thoughts:Suicidal Thoughts: No (Contracted to safety) Homicidal Thoughts:Homicidal Thoughts:  No Hallucinations:Hallucinations: None (Denied AVH) Ideas of Reference:None Thought Content:Scattered, Tangential, Rumination  Sensorium  Memory:Immediate Good Judgment:Fair Insight:Fair  Executive Functions  Orientation:Full (Time, Place and Person) Language:Good Concentration:Good Attention:Good Recall:Good Fund of Knowledge:Good  Psychomotor Activity  Psychomotor Activity:Psychomotor Activity: Normal  Assets  Assets:Communication Skills, Desire for Improvement  Sleep  Quality:Poor  Physical Exam  BP (!) 130/92 (BP Location: Right Arm)   Pulse 84   Temp 98.1 F (36.7 C) (Temporal)   Resp 16   SpO2 98%  Physical Exam Vitals and nursing note reviewed.  Constitutional:      General: He is not in acute distress.    Appearance: He is not ill-appearing, toxic-appearing or diaphoretic.  HENT:     Head: Normocephalic.  Pulmonary:     Effort: Pulmonary effort is normal. No respiratory distress.  Neurological:     Mental Status: He is alert and oriented to person, place, and time.      Assessment / Plan  Total Time spent with patient: 20 minutes Treatment Plan Summary: Daily contact with patient to assess and evaluate symptoms and progress in treatment and Medication management  Principal Problem:   Alcohol use disorder Active Problems:   Polysubstance abuse (HCC)   Valrie HartJonathan W Lean  is a 41 y.o. male with PMH of AUD (h/o DT, no sz), MDD, OUD, HTN, who presented voluntary to [facility] (07/14/2022) with spouse then admitted to [facility] as a referral from [facility] due to patient being on phenobarbitol taper and assistance with maintaining sobriety before admission to [facility]. Total duration of encounter: 1 day  AUD, severe Action stage. Last drink was prior to hospital admission for medical detox in the ICU from 07/06/2022-07/12/2022.  Completed phenobarbitol taper on 11/21 BAL neg. UDS + secobarbitol, buprenorphine, oxazepam. AST/ALT 63/85, uptrending.  Reported  tremors, that he reported as present before detox. Currently anxious and craving EtOH, will see if gabapentin relieves this.  CIWA with ativan PRN per protocol with thiamine & MV supplement Started gabapentin 300 mg TID  MDD Started effexor 37.5 mg daily  OUD Action stage.  Encouraged continued cessation Comfort PRNs  HTN Systolic (24hrs), Avg:146 , Min:130 , Max:154  Diastolic (24hrs), Avg:103, Min:92, Max:108 Suspect 2/2 withdrawal. If does not downtrend in next few days, will consider uptritrating home amlodipine. Continued home amlodipine 5 mg daily   Considerations for follow-up: Recommend high risk screening every 6-12 months with HIV, RPR, hepatitis panel Recommend monitoring LFT every 6-12 months while on naltrexone  DISPO: Tentative date: 11/27 Location: [facility]   Signed: Princess Bruins, DO Psychiatry Resident, PGY-2 07/15/2022, 7:56 PM   [facility] [address] [place], [place] [address] Dept: [phone] Dept Fax: [phone]

## 2022-07-15 NOTE — ED Notes (Signed)
Pt sleeping@this time. Breathing even and unlabored. Will continue to monitor for safety 

## 2022-07-15 NOTE — ED Notes (Signed)
Pt sitting in dining room appropriately interacting with peers. A&O x4, calm and cooperative. Denies current SI/HI/AVH. No signs of distress noted. MHT provided snack. Monitoring for safety. 

## 2022-07-15 NOTE — ED Notes (Signed)
Pt requested for tylenol for foot pain level 5 on a scale of 0-10.

## 2022-07-15 NOTE — ED Notes (Signed)
Pt is in the bed sleeping. Respirations are even and unlabored. No acute distress noted. Will continue to monitor for safety. 

## 2022-07-15 NOTE — ED Notes (Signed)
Patient a&o x3. Denies SI/HI/AVH,Denies withdrawal sx now..Denies intent or plan  to harm self or others. Routine conducted according to faculty protocol .Encourge patient to notify  staff with any needs or concerns. Patient verbalized agreement .Will continue to monitor for safety. 

## 2022-07-15 NOTE — Progress Notes (Signed)
Patient is attending a group run by social worker.   

## 2022-07-15 NOTE — ED Notes (Signed)
Patient is alert x3. Sitting in the break room. Showing no sign of distress.Will continue to monitor.

## 2022-07-15 NOTE — ED Notes (Signed)
Patient a&o x3. Routine conducted according to faculty protocol .Encourge patient to notify  staff with any needs or concerns. Patient verbalized agreement Will continue to monitor for safety. 

## 2022-07-16 ENCOUNTER — Encounter (HOSPITAL_COMMUNITY): Payer: Self-pay | Admitting: Psychiatry

## 2022-07-16 DIAGNOSIS — Z1152 Encounter for screening for COVID-19: Secondary | ICD-10-CM | POA: Diagnosis not present

## 2022-07-16 DIAGNOSIS — F1090 Alcohol use, unspecified, uncomplicated: Secondary | ICD-10-CM | POA: Diagnosis not present

## 2022-07-16 DIAGNOSIS — R251 Tremor, unspecified: Secondary | ICD-10-CM | POA: Diagnosis not present

## 2022-07-16 LAB — COMPREHENSIVE METABOLIC PANEL
ALT: 82 U/L — ABNORMAL HIGH (ref 0–44)
AST: 56 U/L — ABNORMAL HIGH (ref 15–41)
Albumin: 3.9 g/dL (ref 3.5–5.0)
Alkaline Phosphatase: 61 U/L (ref 38–126)
Anion gap: 11 (ref 5–15)
BUN: 13 mg/dL (ref 6–20)
CO2: 22 mmol/L (ref 22–32)
Calcium: 9.5 mg/dL (ref 8.9–10.3)
Chloride: 104 mmol/L (ref 98–111)
Creatinine, Ser: 0.74 mg/dL (ref 0.61–1.24)
GFR, Estimated: >60 mL/min (ref >60)
Glucose, Bld: 108 mg/dL — ABNORMAL HIGH (ref 70–99)
Potassium: 3.9 mmol/L (ref 3.5–5.1)
Sodium: 137 mmol/L (ref 135–145)
Total Bilirubin: 0.8 mg/dL (ref 0.3–1.2)
Total Protein: 6.6 g/dL (ref 6.5–8.1)

## 2022-07-16 MED ORDER — GABAPENTIN 300 MG PO CAPS
300.0000 mg | ORAL_CAPSULE | Freq: Two times a day (BID) | ORAL | Status: AC
  Administered 2022-07-16 – 2022-07-17 (×2): 300 mg via ORAL
  Filled 2022-07-16 (×2): qty 1

## 2022-07-16 MED ORDER — TRAZODONE HCL 100 MG PO TABS
100.0000 mg | ORAL_TABLET | Freq: Every day | ORAL | Status: DC
  Administered 2022-07-16: 100 mg via ORAL
  Filled 2022-07-16: qty 14
  Filled 2022-07-16: qty 1

## 2022-07-16 MED ORDER — NICOTINE POLACRILEX 2 MG MT GUM
4.0000 mg | CHEWING_GUM | OROMUCOSAL | Status: DC | PRN
  Administered 2022-07-16 – 2022-07-17 (×3): 4 mg via ORAL
  Filled 2022-07-16: qty 2
  Filled 2022-07-16: qty 20
  Filled 2022-07-16 (×2): qty 2

## 2022-07-16 MED ORDER — GABAPENTIN 300 MG PO CAPS: 300.0000 mg | ORAL_CAPSULE | Freq: Two times a day (BID) | ORAL | Status: DC

## 2022-07-16 NOTE — ED Notes (Signed)
Patient in dayroom interacting with peers appropriately.  Respirations equal and unlabored, skin warm and dry, NAD. No change in assessment or acuity. Q 15 minute safety checks remain in place.

## 2022-07-16 NOTE — ED Notes (Signed)
Pt sleeping@this time. Breathing even and unlabored. Will continue to monitor for safety 

## 2022-07-16 NOTE — ED Provider Notes (Addendum)
The [facility] Behavioral [facility] Progress Note  Date & Time: 07/16/2022 12:38 PM Name: Philip Cuevas [id number] Age: 41 y.o.  DOB: 1980/11/09  MRN: [id number]  Diagnosis:  Final diagnoses:  Alcohol use disorder    Reason for presentation: Alcohol Problem  Brief HPI  Philip Cuevas is a 41 y.o. male, with PMH of AUD (h/o DT, no sz), OUD, HTN, who presented voluntary to [facility] (07/14/2022) with spouse then admitted to [facility] as a referral from [facility] due to patient being on phenobarbitol taper and assistance with maintaining sobriety before admission to [facility]. Last drink was prior to hospital admission for medical detox in the ICU from 07/06/2022-07/12/2022.  Completed phenobarbitol taper on 11/21  Interval Hx   Patient Narrative:   Patient feels much better today after a more restful sleep last night, stating that it was the best he's had over the past few weeks. Still feeling fatigue, anxious, and depressed, with anxiety being the worst of all 3. Reported EtOH craving to be 3 or 4 out of 10, with 10/10 being the worst. He is experiencing diarrhea that started about 2 weeks ago, that is about the same, not worsening. Denies that current medications are exacerbating it. He tolerated the effexor and gabapentin well, and that he feels his anxiety has improved. Discussed that gabapentin is not allowed at [facility] and he was amenable to down tapering it. He wanted to started naltrexone, however he understands that there needs to be repeat cmp to ensure that his liver is improving.  Stated that would like to be dc'd 11/24 at around 3pm to spend the weekend with family and son, prior to [facility]. He feels very confident about maintaining sobriety while at home.   Suicidal Thoughts: No (Contracted to safety) Homicidal Thoughts: No Hallucinations: None (Denied AVH)  Mood: Anxious, Depressed Sleep:Fair Appetite: Fair, improving  Review of Systems  Cardiovascular:   Negative for chest pain.  Gastrointestinal:  Positive for diarrhea. Negative for nausea and vomiting.  Neurological:  Negative for dizziness, tremors and headaches.     Past History   Psychiatric History: See H&P  Psychiatric Family History: See H&P  Social History:  See H&P  Past Medical History:  Past Medical History:  Diagnosis Date   Anxiety    Asthma    mild, intermittent   Depression    Hypertension    Knee injury     Past Surgical History:  Procedure Laterality Date   DENTAL SURGERY     PILONIDAL CYST EXCISION N/A 04/12/2015   Procedure: CYST EXCISION PILONIDAL SIMPLE;  Surgeon: Franky Macho, MD;  Location: [facility];  Service: General;  Laterality: N/A;   TONSILECTOMY, ADENOIDECTOMY, BILATERAL MYRINGOTOMY AND TUBES     Family History:  Family History  Problem Relation Age of Onset   Hypertension Mother    Hyperlipidemia Mother    Depression Father        committed suicide   Social History   Substance and Sexual Activity  Alcohol Use Yes   Comment: 2 drinks a day    Social History   Substance and Sexual Activity  Drug Use Yes   Comment: snorts heroin    Social History   Socioeconomic History   Marital status: Married    Spouse name: Not on file   Number of children: Not on file   Years of education: Not on file   Highest education level: Not on file  Occupational History   Not on file  Tobacco Use   Smoking status: Some Days    Packs/day: 0.30    Years: 4.00    Total pack years: 1.20    Types: Cigarettes    Last attempt to quit: 10/22/2005    Years since quitting: 16.7   Smokeless tobacco: Never  Substance and Sexual Activity   Alcohol use: Yes    Comment: 2 drinks a day   Drug use: Yes    Comment: snorts heroin   Sexual activity: Not on file    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, 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, 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  Other Topics Concern   Not on file  Social History Narrative   Not on file   Social Determinants  of Health   Financial Resource Strain: Not on file  Food Insecurity: Not on file  Transportation Needs: Not on file  Physical Activity: Not on file  Stress: Not on file  Social Connections: Not on file   SDOH: SDOH Screenings   Depression (PHQ2-9): Medium Risk (06/17/2021)  Tobacco Use: High Risk (07/16/2022)   Additional Social History: Alcohol / Drug Use Pain Medications: See MAR Prescriptions: See MAR Over the Counter: See MAR History of alcohol / drug use?: Yes Longest period of sobriety (when/how long): NA Negative Consequences of Use: Personal relationships Withdrawal Symptoms: None Substance #1 Name of Substance 1: ETOH 1 - Age of First Use: Unknown 1 - Amount (size/oz): varies 1 - Frequency: hx of daily use prior to hospital admit on 11/13 for detox 1 - Duration: "years" 1 - Last Use / Amount: 07/06/22 - 1 beer 1 - Method of Aquiring: NA 1- Route of Use: NA Substance #2 Name of Substance 2: opiates 2 - Age of First Use: NA 2 - Amount (size/oz): Unknown 2 - Frequency: daily prior to stopping cold [place] on 11/4 2 - Duration: "years" 2 - Last Use / Amount: 11/4 2 - Method of Aquiring: NA 2 - Route of Substance Use: NA Current Medications   Current Facility-Administered Medications  Medication Dose Route Frequency Provider Last Rate Last Admin   acetaminophen (TYLENOL) tablet 650 mg  650 mg Oral Q6H PRN Ardis Hughs, NP   650 mg at 07/14/22 2340   albuterol (VENTOLIN HFA) 108 (90 Base) MCG/ACT inhaler 2 puff  2 puff Inhalation Q6H PRN Ardis Hughs, NP       alum & mag hydroxide-simeth (MAALOX/MYLANTA) 200-200-20 MG/5ML suspension 30 mL  30 mL Oral Q4H PRN Ardis Hughs, NP       amLODipine (NORVASC) tablet 5 mg  5 mg Oral Daily Vernard Gambles H, NP   5 mg at 07/16/22 6962   folic acid (FOLVITE) tablet 1 mg  1 mg Oral Daily Ardis Hughs, NP   1 mg at 07/16/22 9528   gabapentin (NEURONTIN) capsule 300 mg  300 mg Oral BID Princess Bruins, DO        hydrOXYzine (ATARAX) tablet 25 mg  25 mg Oral Q6H PRN Ardis Hughs, NP   25 mg at 07/15/22 2122   loperamide (IMODIUM) capsule 2-4 mg  2-4 mg Oral PRN Ardis Hughs, NP   2 mg at 07/16/22 4132   LORazepam (ATIVAN) tablet 1 mg  1 mg Oral Q6H PRN Ardis Hughs, NP       magnesium hydroxide (MILK OF MAGNESIA) suspension 30 mL  30 mL Oral Daily PRN Ardis Hughs, NP       multivitamin with minerals tablet  1 tablet  1 tablet Oral Daily Ardis Hughs, NP   1 tablet at 07/16/22 9702   nicotine (NICODERM CQ - dosed in mg/24 hours) patch 21 mg  21 mg Transdermal Daily Ardis Hughs, NP   21 mg at 07/16/22 6378   nicotine polacrilex (NICORETTE) gum 4 mg  4 mg Oral Q4H PRN Princess Bruins, DO   4 mg at 07/16/22 1047   ondansetron (ZOFRAN-ODT) disintegrating tablet 4 mg  4 mg Oral Q6H PRN Ardis Hughs, NP       thiamine (VITAMIN B1) tablet 100 mg  100 mg Oral Daily Vernard Gambles H, NP   100 mg at 07/16/22 0925   traZODone (DESYREL) tablet 50 mg  50 mg Oral QHS PRN Ardis Hughs, NP   50 mg at 07/15/22 2122   venlafaxine XR (EFFEXOR-XR) 24 hr capsule 37.5 mg  37.5 mg Oral Q breakfast Princess Bruins, DO   37.5 mg at 07/16/22 5885   Current Outpatient Medications  Medication Sig Dispense Refill   amLODipine (NORVASC) 5 MG tablet Take 5 mg by mouth daily.     folic acid (FOLVITE) 1 MG tablet Take 1 tablet (1 mg total) by mouth daily. 30 tablet 2   PHENobarbital (LUMINAL) 32.4 MG tablet Take 1 tablet (32.4 mg total) by mouth every 8 (eight) hours for 5 doses. 5 tablet 0   thiamine (VITAMIN B-1) 100 MG tablet Take 1 tablet (100 mg total) by mouth daily. (Patient not taking: Reported on 07/14/2022) 30 tablet 2    Labs / Images  Lab Results:  Admission on 07/14/2022  Component Date Value Ref Range Status   SARS Coronavirus 2 by RT PCR 07/14/2022 NEGATIVE  NEGATIVE Final   Comment: (NOTE) SARS-CoV-2 target nucleic acids are NOT DETECTED.  The SARS-CoV-2 RNA is  generally detectable in upper respiratory specimens during the acute phase of infection. The lowest concentration of SARS-CoV-2 viral copies this assay can detect is 138 copies/mL. A negative result does not preclude SARS-Cov-2 infection and should not be used as the sole basis for treatment or other patient management decisions. A negative result may occur with  improper specimen collection/handling, submission of specimen other than nasopharyngeal swab, presence of viral mutation(s) within the areas targeted by this assay, and inadequate number of viral copies(<138 copies/mL). A negative result must be combined with clinical observations, patient history, and epidemiological information. The expected result is Negative.  Fact Sheet for Patients:  BloggerCourse.com  Fact Sheet for Healthcare Providers:  SeriousBroker.it  This test is no                          t yet approved or cleared by the [place] FDA and  has been authorized for detection and/or diagnosis of SARS-CoV-2 by FDA under an Emergency Use Authorization (EUA). This EUA will remain  in effect (meaning this test can be used) for the duration of the COVID-19 declaration under Section 564(b)(1) of the Act, 21 U.S.C.section 360bbb-3(b)(1), unless the authorization is terminated  or revoked sooner.       Influenza A by PCR 07/14/2022 NEGATIVE  NEGATIVE Final   Influenza B by PCR 07/14/2022 NEGATIVE  NEGATIVE Final   Comment: (NOTE) The Xpert Xpress SARS-CoV-2/FLU/RSV plus assay is intended as an aid in the diagnosis of influenza from Nasopharyngeal swab specimens and should not be used as a sole basis for treatment. Nasal washings and aspirates are unacceptable for Xpert Xpress SARS-CoV-2/FLU/RSV  testing.  Fact Sheet for Patients: BloggerCourse.com  Fact Sheet for Healthcare Providers: SeriousBroker.it  This  test is not yet approved or cleared by the [place] FDA and has been authorized for detection and/or diagnosis of SARS-CoV-2 by FDA under an Emergency Use Authorization (EUA). This EUA will remain in effect (meaning this test can be used) for the duration of the COVID-19 declaration under Section 564(b)(1) of the Act, 21 U.S.C. section 360bbb-3(b)(1), unless the authorization is terminated or revoked.  Performed at [facility], [address], [place], [place] [address]    WBC 07/14/2022 5.2  4.0 - 10.5 K/uL Final   RBC 07/14/2022 4.18 (L)  4.22 - 5.81 MIL/uL Final   Hemoglobin 07/14/2022 14.9  13.0 - 17.0 g/dL Final   HCT 60/45/4098 42.1  39.0 - 52.0 % Final   MCV 07/14/2022 100.7 (H)  80.0 - 100.0 fL Final   MCH 07/14/2022 35.6 (H)  26.0 - 34.0 pg Final   MCHC 07/14/2022 35.4  30.0 - 36.0 g/dL Final   RDW 11/91/4782 14.0  11.5 - 15.5 % Final   Platelets 07/14/2022 356  150 - 400 K/uL Final   nRBC 07/14/2022 0.0  0.0 - 0.2 % Final   Neutrophils Relative % 07/14/2022 69  % Final   Neutro Abs 07/14/2022 3.6  1.7 - 7.7 K/uL Final   Lymphocytes Relative 07/14/2022 20  % Final   Lymphs Abs 07/14/2022 1.0  0.7 - 4.0 K/uL Final   Monocytes Relative 07/14/2022 8  % Final   Monocytes Absolute 07/14/2022 0.4  0.1 - 1.0 K/uL Final   Eosinophils Relative 07/14/2022 1  % Final   Eosinophils Absolute 07/14/2022 0.1  0.0 - 0.5 K/uL Final   Basophils Relative 07/14/2022 1  % Final   Basophils Absolute 07/14/2022 0.1  0.0 - 0.1 K/uL Final   Immature Granulocytes 07/14/2022 1  % Final   Abs Immature Granulocytes 07/14/2022 0.07  0.00 - 0.07 K/uL Final   Performed at [facility], [address], [place], [place] [address]   Sodium 07/14/2022 138  135 - 145 mmol/L Final   Potassium 07/14/2022 4.1  3.5 - 5.1 mmol/L Final   Chloride 07/14/2022 103  98 - 111 mmol/L Final   CO2 07/14/2022 24  22 - 32 mmol/L Final   Glucose, Bld 07/14/2022 101 (H)  70 - 99 mg/dL Final   Glucose  reference range applies only to samples taken after fasting for at least 8 hours.   BUN 07/14/2022 8  6 - 20 mg/dL Final   Creatinine, Ser 07/14/2022 0.76  0.61 - 1.24 mg/dL Final   Calcium 30/86/5784 9.9  8.9 - 10.3 mg/dL Final   Total Protein 69/62/9528 6.5  6.5 - 8.1 g/dL Final   Albumin 41/32/4401 3.9  3.5 - 5.0 g/dL Final   AST 02/72/5366 63 (H)  15 - 41 U/L Final   ALT 07/14/2022 85 (H)  0 - 44 U/L Final   Alkaline Phosphatase 07/14/2022 73  38 - 126 U/L Final   Total Bilirubin 07/14/2022 0.9  0.3 - 1.2 mg/dL Final   GFR, Estimated 07/14/2022 >60  >60 mL/min Final   Comment: (NOTE) Calculated using the CKD-EPI Creatinine Equation (2021)    Anion gap 07/14/2022 11  5 - 15 Final   Performed at [facility], [address] [place], [place] [address]   Hgb A1c MFr Bld 07/14/2022 5.4  4.8 - 5.6 % Final   Comment: (NOTE)  Pre diabetes:          5.7%-6.4%  Diabetes:              >6.4%  Glycemic control for   <7.0% adults with diabetes    Mean Plasma Glucose 07/14/2022 108.28  mg/dL Final   Performed at [facility], [address] [place], [place] [address]   Magnesium 07/14/2022 2.3  1.7 - 2.4 mg/dL Final   Performed at [facility], [address], [place], [place] [address]   Alcohol, Ethyl (B) 07/14/2022 <10  <10 mg/dL Final   Comment: (NOTE) Lowest detectable limit for serum alcohol is 10 mg/dL.  For medical purposes only. Performed at [facility], [address] [place], [place] [address]    Cholesterol 07/14/2022 199  0 - 200 mg/dL Final   Triglycerides 96/22/2979 180 (H)  <150 mg/dL Final   HDL 89/21/1941 40 (L)  >40 mg/dL Final   Total CHOL/HDL Ratio 07/14/2022 5.0  RATIO Final   VLDL 07/14/2022 36  0 - 40 mg/dL Final   LDL Cholesterol 07/14/2022 123 (H)  0 - 99 mg/dL Final   Comment:        Total Cholesterol/HDL:CHD Risk Coronary Heart Disease Risk Table                     Men   Women  1/2 Average Risk   3.4   3.3  Average Risk        5.0   4.4  2 X Average Risk   9.6   7.1  3 X Average Risk  23.4   11.0        Use the calculated Patient Ratio above and the CHD Risk Table to determine the patient's CHD Risk.        ATP III CLASSIFICATION (LDL):  <100     mg/dL   Optimal  740-814  mg/dL   Near or Above                    Optimal  130-159  mg/dL   Borderline  481-856  mg/dL   High  >314     mg/dL   Very High Performed at [facility], [address] [place], [place] [address]    TSH 07/14/2022 1.785  0.350 - 4.500 uIU/mL Final   Comment: Performed by a 3rd Generation assay with a functional sensitivity of <=0.01 uIU/mL. Performed at [facility], [address], [place], [place] [address]    POC Amphetamine UR 07/14/2022 None Detected  NONE DETECTED (Cut Off Level 1000 ng/mL) Final   POC Secobarbital (BAR) 07/14/2022 Positive (A)  NONE DETECTED (Cut Off Level 300 ng/mL) Final   POC Buprenorphine (BUP) 07/14/2022 Positive (A)  NONE DETECTED (Cut Off Level 10 ng/mL) Final   POC Oxazepam (BZO) 07/14/2022 Positive (A)  NONE DETECTED (Cut Off Level 300 ng/mL) Final   POC Cocaine UR 07/14/2022 None Detected  NONE DETECTED (Cut Off Level 300 ng/mL) Final   POC Methamphetamine UR 07/14/2022 None Detected  NONE DETECTED (Cut Off Level 1000 ng/mL) Final   POC Morphine 07/14/2022 None Detected  NONE DETECTED (Cut Off Level 300 ng/mL) Final   POC Methadone UR 07/14/2022 None Detected  NONE DETECTED (Cut Off Level 300 ng/mL) Final   POC Oxycodone UR 07/14/2022 None Detected  NONE DETECTED (Cut Off Level 100 ng/mL) Final   POC Marijuana UR 07/14/2022 None Detected  NONE DETECTED (Cut Off Level 50 ng/mL) Final   SARS Coronavirus 2 Ag 07/14/2022 Negative  Negative Final  No results displayed because visit has over 200 results.     Blood Alcohol level:  Lab Results  Component Value Date   ETH <10 07/14/2022   ETH <10 07/06/2022   Metabolic Disorder Labs: Lab Results  Component Value Date   HGBA1C 5.4  07/14/2022   MPG 108.28 07/14/2022   MPG 103 06/17/2021   No results found for: "PROLACTIN" Lab Results  Component Value Date   CHOL 199 07/14/2022   TRIG 180 (H) 07/14/2022   HDL 40 (L) 07/14/2022   CHOLHDL 5.0 07/14/2022   VLDL 36 07/14/2022   LDLCALC 123 (H) 07/14/2022   LDLCALC  06/17/2021     Comment:     . LDL cholesterol not calculated. Triglyceride levels greater than 400 mg/dL invalidate calculated LDL results. . Reference range: <100 . Desirable range <100 mg/dL for primary prevention;   <70 mg/dL for patients with CHD or diabetic patients  with > or = 2 CHD risk factors. Marland Kitchen. LDL-C is now calculated using the Martin-Hopkins  calculation, which is a validated novel method providing  better accuracy than the Friedewald equation in the  estimation of LDL-C.  Horald PollenMartin SS et al. Lenox AhrJAMA. 9147;829(562013;310(19): 2061-2068  (http://education.[employer].com/faq/FAQ164)    Therapeutic Lab Levels: No results found for: "LITHIUM" No results found for: "VALPROATE" No results found for: "CBMZ" Physical Findings   GAD-7    Flowsheet Row [facility]  Total GAD-7 Score 14      PHQ2-9    Flowsheet Row [facility] [place]  PHQ-2 Total Score 2 2  PHQ-9 Total Score 8 --      Flowsheet Row [facility] (Discharged) from 07[facility]06/2022 in [facility] ED from 05/22/2021 in [facility] EM[facility] DEPARTMENT  C-SSRS RISK CATEGORY No Risk No Risk No Risk       Musculoskeletal  Strength & Muscle Tone: within normal limits Gait & Station: normal Patient leans: N/A   Psychiatric Specialty Exam   Presentation  General Appearance:Appropriate for Environment, Casual, Fairly  Groomed Eye Contact:Good Speech:Clear and Coherent, Normal Rate Volume:Normal Handedness:Right  Mood and Affect  Mood:Anxious, Depressed Affect:Appropriate, Congruent, Constricted  Thought Process  Thought Process:Coherent, Goal Directed Descriptions of Associations:Circumstantial  Thought Content Suicidal Thoughts:Suicidal Thoughts: No (Contracted to safety) Homicidal Thoughts:Homicidal Thoughts: No Hallucinations:Hallucinations: None (Denied AVH) Ideas of Reference:None Thought Content:Rumination  Sensorium  Memory:Immediate Good Judgment:Fair Insight:Fair  Executive Functions  Orientation:Full (Time, Place and Person) Language:Good Concentration:Good Attention:Good Recall:Good Fund of Knowledge:Good  Psychomotor Activity  Psychomotor Activity:Psychomotor Activity: Normal  Assets  Assets:Communication Skills, Desire for Improvement, Housing, Intimacy, Resilience, Social Support  Sleep  Quality:Fair  Physical Exam  BP (!) 138/95   Pulse (!) 106   Temp 97.7 F (36.5 C) (Oral)   Resp 19   SpO2 100%  Physical Exam Vitals and nursing note reviewed.  Constitutional:      General: He is not in acute distress.    Appearance: He is not ill-appearing, toxic-appearing or diaphoretic.  HENT:     Head: Normocephalic.  Pulmonary:     Effort: Pulmonary effort is normal. No respiratory distress.  Neurological:     Mental  Status: He is alert and oriented to person, place, and time.      Assessment / Plan  Total Time spent with patient: 20 minutes Treatment Plan Summary: Daily contact with patient to assess and evaluate symptoms and progress in treatment and Medication management  Principal Problem:   Alcohol use disorder Active Problems:   Polysubstance abuse (HCC)   RODRIC PUNCH is a 41 y.o. male with PMH of AUD (h/o DT, no sz), MDD, OUD, HTN, who presented voluntary to [facility] (07/14/2022) with spouse then admitted to [facility] as a referral from [facility]  due to patient being on phenobarbitol taper and assistance with maintaining sobriety before admission to [facility]. Total duration of encounter: 2 days  AUD, severe Action stage. Last drink was prior to hospital admission for medical detox in the ICU from 07/06/2022-07/12/2022.  Completed phenobarbitol taper on 11/21 BAL neg. UDS + secobarbitol, buprenorphine, oxazepam. AST/ALT 63/85, uptrending.  Reported tremors, that he reported as present before detox. Anxiety and craving has improved some with gabapentin, however since it is prohibited at [facility], will taper patient off per below.  No longer tremulous. CIWA 4,2,0 Disulfiram was discussed with patient due to high risk time prior to residential, patient is still thinking about it. CIWA with ativan PRN per protocol with thiamine & MV supplement Downtapering gabapentin 300 mg TID > 300 mg BID (11/23) > 300 mg once (11/24) Follow-up CMP, if AST/ALT if down trending, plan to start naltrexone 50 mg 11/23 PM Increased trazodone from 50 mg to 100 mg nightly, now scheduled - required 2 doses of 50 mg for sleep 11/22 PM  MDD Continued effexor 37.5 mg daily  OUD Action stage. Declined new rx for narcan, reported already having one at home. Encouraged continued cessation Comfort PRNs  HTN, improving Systolic (24hrs), Avg:139 , Min:130 , Max:149  Diastolic (24hrs), Avg:94, Min:92, Max:95 Suspect 2/2 withdrawal. Downtrending. Reported that in the past, pcp also had him on benicar and hctz.  Continued home amlodipine 5 mg daily  Recommended f/u with PCP for med management  Considerations for follow-up: Recommend high risk screening every 6-12 months with HIV, RPR, hepatitis panel Recommend monitoring LFT every 6-12 months if on naltrexone  DISPO: Tentative date: 11/24 Location: Home with family [facility] - accepted for 11/27   Signed: Princess Bruins, DO Psychiatry Resident, PGY-2 07/16/2022, 12:38 PM   [facility] [address] [place], [place] [address] Dept: [phone] Dept Fax: [phone]

## 2022-07-16 NOTE — ED Notes (Signed)
Pt is in the dayroom watching TV.  Respirations are even and unlabored. No acute distress noted. Will continue to monitor for safety. 

## 2022-07-16 NOTE — ED Notes (Signed)
Patient sitting in dayroom watching TV. Respirations equal and unlabored, skin warm and dry, NAD. No change in assessment or acuity. Q 15 minute safety checks remain in place.   

## 2022-07-16 NOTE — ED Notes (Signed)
Pt is in the bed sleeping. Respirations are even and unlabored. No acute distress noted. Will continue to monitor for safety. 

## 2022-07-16 NOTE — ED Notes (Signed)
Patient A&Ox4. Patient denies SI/HI and AVH.Patient denies any physical complaints when asked. No acute distress noted. Support and encouragement provided. Routine safety checks conducted according to facility protocol. Encouraged patient to notify staff if thoughts of harm toward self or others arise. Patient verbalize understanding and agreement. Q 15 min safety checks remain in place.Will continue to monitor for safety.

## 2022-07-17 ENCOUNTER — Encounter (HOSPITAL_COMMUNITY): Payer: Self-pay | Admitting: Psychiatry

## 2022-07-17 DIAGNOSIS — R251 Tremor, unspecified: Secondary | ICD-10-CM | POA: Diagnosis not present

## 2022-07-17 DIAGNOSIS — Z1152 Encounter for screening for COVID-19: Secondary | ICD-10-CM | POA: Diagnosis not present

## 2022-07-17 DIAGNOSIS — F10931 Alcohol use, unspecified with withdrawal delirium: Secondary | ICD-10-CM | POA: Diagnosis not present

## 2022-07-17 DIAGNOSIS — F1090 Alcohol use, unspecified, uncomplicated: Secondary | ICD-10-CM | POA: Diagnosis not present

## 2022-07-17 MED ORDER — NICOTINE POLACRILEX 4 MG MT GUM: 4.0000 mg | CHEWING_GUM | OROMUCOSAL | 0 refills | Status: DC | PRN

## 2022-07-17 MED ORDER — NICOTINE 21 MG/24HR TD PT24: 21.0000 mg | MEDICATED_PATCH | Freq: Every day | TRANSDERMAL | 0 refills | Status: AC

## 2022-07-17 MED ORDER — NALTREXONE HCL 50 MG PO TABS: 50.0000 mg | ORAL_TABLET | Freq: Every day | ORAL | 1 refills | Status: AC

## 2022-07-17 MED ORDER — HYDROXYZINE HCL 25 MG PO TABS
25.0000 mg | ORAL_TABLET | Freq: Three times a day (TID) | ORAL | Status: DC | PRN
  Filled 2022-07-17: qty 15

## 2022-07-17 MED ORDER — AMLODIPINE BESYLATE 5 MG PO TABS: 5.0000 mg | ORAL_TABLET | Freq: Every day | ORAL | 1 refills | Status: DC

## 2022-07-17 MED ORDER — DISULFIRAM 250 MG PO TABS
500.0000 mg | ORAL_TABLET | Freq: Three times a day (TID) | ORAL | Status: DC
  Filled 2022-07-17: qty 18

## 2022-07-17 MED ORDER — DISULFIRAM 500 MG PO TABS: 500.0000 mg | ORAL_TABLET | Freq: Every day | ORAL | 0 refills | Status: AC

## 2022-07-17 MED ORDER — MELATONIN 3 MG PO TABS: 3.0000 mg | ORAL_TABLET | Freq: Every day | ORAL | 0 refills | Status: AC

## 2022-07-17 MED ORDER — NALTREXONE HCL 50 MG PO TABS
50.0000 mg | ORAL_TABLET | Freq: Every day | ORAL | Status: DC
  Administered 2022-07-17: 50 mg via ORAL
  Filled 2022-07-17: qty 14
  Filled 2022-07-17: qty 1

## 2022-07-17 MED ORDER — DISULFIRAM 500 MG PO TABS: 500.0000 mg | ORAL_TABLET | Freq: Every day | ORAL | 0 refills | Status: DC

## 2022-07-17 MED ORDER — ADULT MULTIVITAMIN W/MINERALS CH: 1.0000 | ORAL_TABLET | Freq: Every day | ORAL | 1 refills | Status: AC

## 2022-07-17 MED ORDER — MELATONIN 3 MG PO TABS
3.0000 mg | ORAL_TABLET | Freq: Every day | ORAL | Status: DC
  Filled 2022-07-17 (×2): qty 14

## 2022-07-17 MED ORDER — FOLIC ACID 1 MG PO TABS: 1.0000 mg | ORAL_TABLET | Freq: Every day | ORAL | 1 refills | Status: AC

## 2022-07-17 MED ORDER — VITAMIN B-1 100 MG PO TABS: 100.0000 mg | ORAL_TABLET | Freq: Every day | ORAL | 1 refills | Status: AC

## 2022-07-17 MED ORDER — HYDROXYZINE HCL 25 MG PO TABS: 25.0000 mg | ORAL_TABLET | Freq: Three times a day (TID) | ORAL | 0 refills | Status: AC | PRN

## 2022-07-17 MED ORDER — VENLAFAXINE HCL ER 37.5 MG PO CP24: 37.5000 mg | ORAL_CAPSULE | Freq: Every day | ORAL | 1 refills | Status: DC

## 2022-07-17 MED ORDER — TRAZODONE HCL 100 MG PO TABS: 100.0000 mg | ORAL_TABLET | Freq: Every day | ORAL | 1 refills | Status: DC

## 2022-07-17 NOTE — ED Provider Notes (Signed)
Brief Note  CC: Notified by RN that patient wanted to speak about tremors.   S: "30 mins after talking to Dr. Gasper Sells, I started to have tremors, and I just wanted to make sure it was not something I didn't need to stay for". Patient had eaten lunch without issues. Only new medication was naltrexone, that he took about 3 hrs ago.   O: BP 130/88 (BP Location: Right Arm)   Pulse (!) 111   Temp 98.5 F (36.9 C) (Oral)   Resp 20   SpO2 97%   Mental Status: Speech is clear, coherent, spontaneous  Comprehension intact and able to follow commands (simple & midline, multistep & crossing midline)  Cranial Nerves: II: Pupils are equal, round, and reactive to light.   III,IV, VI: EOMI without ptosis or diploplia.  V: Facial sensation is symmetric to light touch. VII: Facial movement is symmetric resting and smiling. No facial droop. XI: Shoulder shrug symmetric XII: Tongue protrudes midline without atrophy Motor Tone is normal. Bulk is normal. 5/5 strength was present in all four extremities and pronator drift was absent.  Bilateral fine tremor at rest in upper extremity. Sensory: Sensation is symmetric to light touch in the arms and legs bilaterally   A/P: Tremor at rest Suspect 2/2 anxiety and not EtOH withdrawal as patient's last dose of phenobarbital taper was on 11/21. Patient only had tremor when he first arrived on the unit and when he's about to be dc'd.  Gave reassurance   Signed: Princess Bruins, DO Psychiatry Resident, PGY-2 [facility] 07/17/2022, 3:56 PM

## 2022-07-17 NOTE — ED Notes (Signed)
Pt had another patient come to the nurses desk to inform this nurse that the patient was not feeling well, shaking and was pale in color. This nurse questioned patient how was he feeling. Pt stated that he felt nervous and would like to see the doctor prior to leaving the facility. This nurse informed the provider of the request. VS 141/102 98.5 110 98% RA. Pt currently with the providers. Safety maintained.

## 2022-07-17 NOTE — ED Provider Notes (Signed)
FBC/OBS ASAP Discharge Summary  Date and Time: 07/17/2022, 11:11 AM  Name: Philip Cuevas  Age: 41 y.o.  DOB: 11/24/1980  MRN:  [id number]   Discharge Diagnoses:  Final diagnoses:  Alcohol use disorder  Opioid use disorder  Hypertension, unspecified type  GAD  Tobacco use disorder  MDD (major depressive disorder), severe (HCC)    HPI:  Per H&P: " Philip Cuevas 41 y.o., male patient presented to [facility] as a walk in accompanied by his spouse with complaints of, "I went to [facility] this morning and they rejected me due to taking my last pill of phenobarbital".   Philip Cuevas, 41 y.o., male patient seen face to face by this provider, consulted with Dr. Gasper Sells; and chart reviewed on 07/14/22.  Per chart review patient previously presented to [facility] requesting alcohol detox on 07/06/2022 and was transferred to the [facility] emergency department for medical clearance.  He was medically admitted from 07/06/2022-07/12/2022 and was admitted to ICU during that time.  There is no documentation of any alcohol withdrawal seizures.  However there is documentation of delirium.  Patient was discharged home with his spouse where he has been supervised by her since his hospital discharge.  He was discharged on a phenobarbital taper that ended this a.m.  He denies any substance or alcohol use, states his last use was before his admission to the hospital.    Philip Cuevas reports after he was discharged from [facility] emergency department he went home with his spouse where she has supervised him 24/7.  He is adamant that he has not taken any substances.  He is denying any alcohol withdrawal symptoms.  He does have a small fine hand tremor which he states is normal for him.  He appears to be motivated to maintain his sobriety.  However he does not feel that he can maintain his sobriety on his own.  He states, "at night I think a lot about drinking alcohol, I do not want to but it is what  I think about and I am afraid I am going to drink again".    During evaluation Philip Cuevas is observed sitting in the assessment room in no acute distress.  He is alert/oriented x 4, calm, cooperative, and attentive.  He is casually dressed and makes good eye contact.  He has normal speech and behavior.  He is denying any depressive symptoms and has a euthymic affect.  He denies SI/HI/AVH.  He does not appear to be responding to internal/external stimuli.  Patient remained calm and answered questions appropriately.     Discussed facility base crisis admission with patient.  He is in agreement.  Explained to patient that if he were to experience any alcohol withdrawal symptoms including confusion or hallucinations to notify staff immediately.  He verbalizes understanding.  CIWA protocol will be ordered as a precautionary measure.   Case was consulted with Dr. Gasper Sells and due to patient already completing detox in the hospital he is medically stable at this time to be admitted to the facility base crisis unit while awaiting bed availability at [facility] residential. "  Subjective:  "Yesterday was a shitty day because everyone was on edge". He suspects that another patient on the unit was using substances, where she was in the bathroom for a long time, then appeared altered. On top of that he believes that another patient left yesterday because of another patient's bizarre behavior.  Stated this affected his behavior,  he currently feels upset, although improved since yesterday.  Also reported he feels slightly anxious, although much much improved. His craving for EtOH currently is 5/10, 10/10 being the worst.  Denied side effects to medications. His diarrhea has improved to loose stools. His sleep is still improving. He was amenable to antabuse because he is leaving today at 3pm. Stated that is when his wife will pick up around 3pm, after she leave work. His son will be in town from 3pm to 6pm today  which is why he wanted to leave. [facility] from his [place] on [place] 11/27.  WG:NFAOZHYQ Thoughts: No (Contracted to safety) MV:HQIONGEXB Thoughts: No MWU:XLKGMWNUUVOZDG: None (Denied AVH) Ideas of UYQ:IHKV   Mood: Dysphoric, Anxious Sleep:Fair Appetite: Good  Review of Systems  Respiratory:  Negative for shortness of breath.   Cardiovascular:  Negative for chest pain.  Gastrointestinal:  Positive for abdominal pain. Negative for diarrhea, nausea and vomiting.  Neurological:  Negative for dizziness, tremors and headaches.    Stay Summary:  Philip Cuevas is a 41 y.o. male  with PMH of AUD (h/o DT, no sz), MDD, OUD, HTN, who presented voluntary to [facility] (07/14/2022) with spouse then admitted to [facility] as a referral from [facility] due to patient being on phenobarbitol taper and assistance with maintaining sobriety before admission to [facility]. Total duration of encounter: 3 days   Patient was engaged and cooperative in treatment and group therapy while at [facility].  No behavioral concerns, no agitation PRN's required.  He completed his alcohol/phenobarbital detox with minimal withdrawal symptoms.  AUD, severe Action stage. Last drink was prior to hospital admission for medical detox in the ICU from 07/06/2022-07/12/2022.  Completed phenobarbitol taper on 11/21 BAL neg. UDS + secobarbitol, buprenorphine, oxazepam. AST/ALT 63/85, uptrending.  Reported tremors, that he reported as present before detox. Anxiety and craving has improved some with gabapentin, however since it is prohibited at [facility], will taper patient off per below.  No longer tremulous. CIWA 4,2,0,0.  CIWA with ativan PRN per protocol with thiamine & MV supplement Completed taper of gabapentin 300 mg TID > 300 mg BID (11/23) > 300 mg once (11/24) Naltrexone 50mg  daily Disufiram 500mg  daily x3 Continued trazodone100 mg nightly   MDD Continued effexor 37.5 mg daily   OUD Action stage. Declined new rx for narcan,  reported already having one at home. Encouraged continued cessation Comfort PRNs   HTN, stable Suspect 2/2 withdrawal. Downtrending. Reported that in the past, pcp also had him on benicar and hctz.  Continued home amlodipine 5 mg daily  Recommended f/u with PCP for med management   Considerations for follow-up: Recommend high risk screening every 6-12 months with HIV, RPR, hepatitis panel Recommend monitoring LFT every 6-12 months if on naltrexone Outpatient psychiatry and therapy    DISPO: Tentative date: 11/24 Location: Home with family [facility] - accepted for 11/27     NEW medications . Disulfiram 500 MG Tabs; Commonly known as: Antabuse; Take 1 tablet (500  mg total) by mouth daily for 3 days. . hydrOXYzine 25 MG tablet; Commonly known as: ATARAX; Take 1 tablet (25  mg total) by mouth every 8 (eight) hours as needed (anxiety/agitation or  CIWA < or = 10). . melatonin 3 MG Tabs tablet; Take 1 tablet (3 mg total) by mouth at  bedtime. . naltrexone 50 MG tablet; Commonly known as: DEPADE; Take 1 tablet (50 mg  total) by mouth daily. . nicotine 21 mg/24hr patch; Commonly known  as: NICODERM CQ - dosed in  mg/24 hours; Place 1 patch (21 mg total) onto the skin daily.; Start  taking on: July 18, 2022 . nicotine polacrilex 4 MG gum; Commonly known as: NICORETTE; Take 1 each  (4 mg total) by mouth every 4 (four) hours as needed for smoking  cessation. . traZODone 100 MG tablet; Commonly known as: DESYREL; Take 1 tablet (100  mg total) by mouth at bedtime. Marland Kitchen venlafaxine XR 37.5 MG 24 hr capsule; Commonly known as: EFFEXOR-XR;  Take 1 capsule (37.5 mg total) by mouth daily with breakfast.; Start  taking on: July 18, 2022   CONTINUE taking these home medications   . amLODipine 5 MG tablet; Commonly known as: NORVASC; Take 1 tablet (5 mg  total) by mouth daily. . folic acid 1 MG tablet; Commonly known as: FOLVITE; Take 1 tablet (1 mg  total) by mouth daily. .  multivitamin with minerals Tabs tablet; Take 1 tablet by mouth daily.;  Start taking on: July 18, 2022 . thiamine 100 MG tablet; Commonly known as: Vitamin B-1; Take 1 tablet  (100 mg total) by mouth daily.   STOP taking these medications   . PHENobarbital 32.4 MG tablet; Commonly known as: LUMINAL   Clinical Course as of 07/17/22 1111  Wed Jul 15, 2022  0818 POC Secobarbital [facility])(!): Positive [JN]  0818 POC Buprenorphine (BUP)(!): Positive [JN]  0818 POC Oxazepam (BZO)(!): Positive [JN]  0818 Alcohol, Ethyl (B): <10 [JN]  0820 Triglycerides(!): 180 [JN]  0820 HDL Cholesterol(!): 40 [JN]  0820 LDL (calc)(!): 123 [JN]  0820 TSH: 1.785 [JN]  0820 AST(!): 63 [JN]  0820 ALT(!): 85 [JN]  0821 Alkaline Phosphatase: 73 [JN]  0821 Hemoglobin A1C: 5.4 [JN]  0821 Hemoglobin: 14.9 [JN]  0821 Platelets: 356 [JN]  0825 EKG Sinus tach 101. QTc 446 [JN]    Clinical Course User Index [JN] Princess Bruins, DO    While future psychiatric events cannot be accurately predicted, the patient does not currently require acute inpatient psychiatric care and does not currently meet [facility] involuntary commitment criteria.  Past Psychiatric History: Per H&P Past Medical History:  Past Medical History:  Diagnosis Date  . Anxiety   . Asthma    mild, intermittent  . Depression   . Hypertension   . Knee injury   . Tobacco use disorder 06/04/2006   Qualifier: Diagnosis of   By: Thomos Lemons        Past Surgical History:  Procedure Laterality Date  . DENTAL SURGERY    . PILONIDAL CYST EXCISION N/A 04/12/2015   Procedure: CYST EXCISION PILONIDAL SIMPLE;  Surgeon: Franky Macho, MD;  Location: [facility];  Service: General;  Laterality: N/A;  . TONSILECTOMY, ADENOIDECTOMY, BILATERAL MYRINGOTOMY AND TUBES     Family History:  Family History  Problem Relation Age of Onset  . Hypertension Mother   . Hyperlipidemia Mother   . Depression Father        committed suicide   Family  Psychiatric History: Per H&P Social History:  Social History   Substance and Sexual Activity  Alcohol Use Yes   Comment: 2 drinks a day     Social History   Substance and Sexual Activity  Drug Use Yes   Comment: snorts heroin    Social History   Socioeconomic History  . Marital status: Married    Spouse name: Not on file  . Number of children: Not on file  . Years of education: Not on file  .  Highest education level: Not on file  Occupational History  . Not on file  Tobacco Use  . Smoking status: Some Days    Packs/day: 0.30    Years: 4.00    Total pack years: 1.20    Types: Cigarettes    Last attempt to quit: 10/22/2005    Years since quitting: 16.7  . Smokeless tobacco: Never  Substance and Sexual Activity  . Alcohol use: Yes    Comment: 2 drinks a day  . Drug use: Yes    Comment: snorts heroin  . 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 Not on file  Social History Narrative  . Not on file   Social Determinants of Health   Financial Resource Strain: Not on file  Food Insecurity: Not on file  Transportation Needs: Not on file  Physical Activity: Not on file  Stress: Not on file  Social Connections: Not on file   SDOH:  SDOH Screenings   Depression (PHQ2-9): Medium Risk (07/16/2022)  Tobacco Use: High Risk (07/17/2022)   Tobacco Cessation:  A prescription for an FDA-approved tobacco cessation medication provided at discharge  Current Medications:  Current Facility-Administered Medications  Medication Dose Route Frequency Provider Last Rate Last Admin  . acetaminophen (TYLENOL) tablet 650 mg  650 mg Oral Q6H PRN Ardis Hughsoleman, Carolyn H, NP   650 mg at 07/14/22 2340  . albuterol (VENTOLIN HFA) 108 (90 Base) MCG/ACT inhaler 2 puff  2 puff Inhalation Q6H PRN Ardis Hughsoleman, Carolyn H, NP      . alum & mag hydroxide-simeth (MAALOX/MYLANTA) 200-200-20 MG/5ML  suspension 30 mL  30 mL Oral Q4H PRN Ardis Hughsoleman, Carolyn H, NP      . amLODipine (NORVASC) tablet 5 mg  5 mg Oral Daily Ardis Hughsoleman, Carolyn H, NP   5 mg at 07/17/22 0937  . folic acid (FOLVITE) tablet 1 mg  1 mg Oral Daily Ardis Hughsoleman, Carolyn H, NP   1 mg at 07/17/22 [id number]  . magnesium hydroxide (MILK OF MAGNESIA) suspension 30 mL  30 mL Oral Daily PRN Ardis Hughsoleman, Carolyn H, NP      . melatonin tablet 3 mg  3 mg Oral QHS Princess BruinsNguyen,  Grosser, DO      . multivitamin with minerals tablet 1 tablet  1 tablet Oral Daily Ardis Hughsoleman, Carolyn H, NP   1 tablet at 07/17/22 [id number]  . naltrexone (DEPADE) tablet 50 mg  50 mg Oral Daily Princess BruinsNguyen, Susanne Baumgarner, DO   50 mg at 07/17/22 1050  . nicotine (NICODERM CQ - dosed in mg/24 hours) patch 21 mg  21 mg Transdermal Daily Vernard Gamblesoleman, Carolyn H, NP   21 mg at 07/17/22 0937  . nicotine polacrilex (NICORETTE) gum 4 mg  4 mg Oral Q4H PRN Princess BruinsNguyen, Antonae Zbikowski, DO   4 mg at 07/17/22 0916  . thiamine (VITAMIN B1) tablet 100 mg  100 mg Oral Daily Ardis Hughsoleman, Carolyn H, NP   100 mg at 07/17/22 [id number]  . traZODone (DESYREL) tablet 100 mg  100 mg Oral QHS Princess BruinsNguyen, Leigh Kaeding, DO   100 mg at 07/16/22 2121  . venlafaxine XR (EFFEXOR-XR) 24 hr capsule 37.5 mg  37.5 mg Oral Q breakfast Princess Bruinsguyen, Skyllar Notarianni, DO   37.5 mg at 07/17/22 [id number]   Current Outpatient Medications  Medication Sig Dispense Refill  . Disulfiram (ANTABUSE) 500 MG TABS Take 1 tablet (500 mg total) by mouth daily for 3 days. 3  tablet 0  . amLODipine (NORVASC) 5 MG tablet Take 1 tablet (5 mg total) by mouth daily. 30 tablet 1  . folic acid (FOLVITE) 1 MG tablet Take 1 tablet (1 mg total) by mouth daily. 30 tablet 1  . hydrOXYzine (ATARAX) 25 MG tablet Take 1 tablet (25 mg total) by mouth every 8 (eight) hours as needed (anxiety/agitation or CIWA < or = 10). 30 tablet 0  . melatonin 3 MG TABS tablet Take 1 tablet (3 mg total) by mouth at bedtime. 60 tablet 0  . [START ON 07/18/2022] Multiple Vitamin (MULTIVITAMIN WITH MINERALS) TABS tablet Take 1 tablet by mouth daily.  30 tablet 1  . naltrexone (DEPADE) 50 MG tablet Take 1 tablet (50 mg total) by mouth daily. 30 tablet 1  . [START ON 07/18/2022] nicotine (NICODERM CQ - DOSED IN MG/24 HOURS) 21 mg/24hr patch Place 1 patch (21 mg total) onto the skin daily. 60 patch 0  . nicotine polacrilex (NICORETTE) 4 MG gum Take 1 each (4 mg total) by mouth every 4 (four) hours as needed for smoking cessation. 100 tablet 0  . thiamine (VITAMIN B-1) 100 MG tablet Take 1 tablet (100 mg total) by mouth daily. 30 tablet 1  . traZODone (DESYREL) 100 MG tablet Take 1 tablet (100 mg total) by mouth at bedtime. 30 tablet 1  . [START ON 07/18/2022] venlafaxine XR (EFFEXOR-XR) 37.5 MG 24 hr capsule Take 1 capsule (37.5 mg total) by mouth daily with breakfast. 30 capsule 1    PTA Medications: (Not in a hospital admission)     07/16/2022   11:05 AM 07/14/2022   11:05 AM 06/17/2021    5:09 PM  Depression screen PHQ 2/9  Decreased Interest 1 2 1   Down, Depressed, Hopeless 1 2 1   PHQ - 2 Score 2 4 2   Altered sleeping 3 1 2   Tired, decreased energy 2 2 2   Change in appetite 0 3 1  Feeling bad or failure about yourself  2 2 1   Trouble concentrating 1 2 0  Moving slowly or fidgety/restless 0 0 0  Suicidal thoughts 0 0 0  PHQ-9 Score 10 14 8   Difficult doing work/chores Somewhat difficult Somewhat difficult Somewhat difficult    Flowsheet Row ED from 07/14/2022 in [facility] ED to Hosp-Admission (Discharged) from 07/06/2022 in [facility] ED from 05/22/2021 in [facility] EMERGENCY DEPARTMENT  C-SSRS RISK CATEGORY No Risk No Risk No Risk       Musculoskeletal  Strength & Muscle Tone: within normal limits Gait & Station: normal Patient leans: N/A   Psychiatric Specialty Exam   Presentation  General Appearance:Appropriate for Environment, Casual, Fairly Groomed Eye Contact:Good Speech:Clear and Coherent, Normal Rate Volume:Normal Handedness:Right  Mood  and Affect  Mood:Dysphoric, Anxious Affect:Appropriate, Congruent, Constricted  Thought Process  Thought Process:Coherent, Goal Directed Descriptions of Associations:Circumstantial  Thought Content Suicidal Thoughts:Suicidal Thoughts: No (Contracted to safety) Homicidal Thoughts:Homicidal Thoughts: No Hallucinations:Hallucinations: None (Denied AVH) Ideas of Reference:None Thought Content:Rumination, Scattered  Sensorium  Memory:Immediate Good Judgment:Fair Insight:Fair  Executive Functions  Orientation:Full (Time, Place and Person) Language:Good Concentration:Good Attention:Good Recall:Good Fund of Knowledge:Good  Psychomotor Activity  Psychomotor Activity:Psychomotor Activity: Normal  Assets  Assets:Communication Skills, Desire for Improvement, Housing, Intimacy, Social Support, Resilience  Sleep  Quality:Fair  Physical Exam  BP 130/88 (BP Location: Right Arm)   Pulse (!) 111   Temp 98.5 F (36.9 C) (Oral)   Resp 20   SpO2 97%  Physical Exam Vitals and nursing note reviewed.  Constitutional:      General: He is not in acute distress.    Appearance: He is not ill-appearing, toxic-appearing or diaphoretic.  HENT:     Head: Normocephalic.  Pulmonary:     Effort: Pulmonary effort is normal. No respiratory distress.  Neurological:     Mental Status: He is alert.    Demographic Factors:  Male and Caucasian  Loss Factors: NA  Historical Factors: Impulsivity  Risk Reduction Factors:   Sense of responsibility to family, Living with another person, especially a relative, and Positive social support  Continued Clinical Symptoms:  Depression:   Comorbid alcohol abuse/dependence Alcohol/Substance Abuse/Dependencies Previous Psychiatric Diagnoses and Treatments  Cognitive Features That Contribute To Risk:  None    Suicide Risk:  Mild:  Suicidal ideation of limited frequency, intensity, duration, and specificity.  There are no identifiable plans, no  associated intent, mild dysphoria and related symptoms, good self-control (both objective and subjective assessment), few other risk factors, and identifiable protective factors, including available and accessible social support.  Plan Of Care/Follow-up recommendations:  Activity and diet at tolerated.  Please: Take all medications as prescribed by your mental healthcare provider. Report any adverse effects and or reactions from the medicines to your outpatient provider promptly. Do not engage in alcohol and or illegal drug use while on prescription medicines.  Disposition: Home with family. Will be going to [facility] from   Total Time spent with patient: 20 minutes  Signed: Princess Bruins, DO Psychiatry Resident, PGY-2 [facility] 07/17/2022, 11:11 AM

## 2022-07-17 NOTE — ED Notes (Signed)
Patient awake and alert this morning.  Patient ate breakfast and is now speaking with M.D.  Patient is calm and cooperative with care.  He is social with peers and appears motivated for treatment.  He makes needs known to staff and is appropriate in his interaction.  Will monitor and provide a safe environment.

## 2022-07-17 NOTE — ED Notes (Signed)
Pt is in the bed sleeping. Respirations are even and unlabored. No acute distress noted. Will continue to monitor for safety. 

## 2022-07-17 NOTE — Discharge Planning (Signed)
LCSW spoke with patient on today regarding his plan to discharge on today. Patient reported that he would like to discharge on today because his oldest son is coming into town for a few hours and it has been a while since he has seen him. Patient reports he would like to spend some time with his family, go to an NA meeting tomorrow, and then attend church on Sunday with a plan to admit into [facility] at 7:00am on Monday morning. Patient reports if he is denied from [facility] for whatever reason, then his plan is to seek intensive outpatient treatment here at the [facility]. Patient reports feelings of being motivated to change. 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 Patient appeared to express great hope and zeal about embarking on that journey again. LCSW was able to provide brief supportive counseling to the patient, and the patient was receptive to the feedback provided. Patient reports awareness of AA and NA resources, and reports his plan is to seek residential placement at this time. No other needs were reported at this time.   LCSW to sign off. Please inform if further SW needs arise prior to discharge.   Fernande Boyden, LCSW Clinical Social Worker [place] [facility] Ph: [phone]

## 2022-07-17 NOTE — Discharge Instructions (Signed)
Dear Philip Cuevas,  Most effective treatment for your mental health disease involves BOTH a psychiatrist AND a therapist Psychiatrist to manage medications Therapist to help identify personal goals, barriers from those goals, and plan to achieve those goals by understanding emotions Please make regular appointments with an outpatient psychiatrist and other doctors once you leave the hospital (if any, otherwise, please see below for resources to make an appointment).  For therapy outside the hospital, please ask for these specific types of therapy: DBT ________________________________________________________  SAFETY CRISIS  Dial 988 for National Suicide & Crisis Lifeline    Text [phone] for Crisis Text Line:     [facility] U[facility]ENT [facility]:  [address] FIRST FLOOR.  [place], [place] [address].  [phone]  Mobile Crisis Response Teams Listed by counties in vicinity of [facility] [place] providers [facility] [phone] [facility] [phone] [facility] [phone] [facility] [phone] [place]                * [facility] [phone]                * [facility] [phone] [facility] [phone] [facility]  [phone] * [facility] [phone] ________________________________________________________  To see which pharmacy near you is the CHEAPEST for certain medications, please use GoodRx. It is free website and has a free phone app.    Also consider looking at [facility] $4.00 or [facility]'s $7.00 prescription list. Both are free to view if googled "[facility] $4 prescription" and "public's $7 prescription". These are set prices, no insurance required. [facility] low cost medications: $4-$15 for 30days  prescriptions or $10-$38 for 90days prescriptions  ________________________________________________________  Difficulties with sleep?   Can also use this free app for insomnia called CBT-I. Let your doctors and therapists know so they can help with extra tips and tricks or for guidance and accountability. NO ADDS on the app.     ________________________________________________________  Non-Emergent / Urgent  [facility] [address], SECOND FLOOR [place], [place] [address] [phone] OUTPATIENT Walk-in information: Please note, all walk-ins are first come & first serve, with limited number of availability.  Please note that to be eligible for services you must bring: ID or a piece of mail with your name [facility] address  Therapist for therapy:  Monday & Wednesdays: Please ARRIVE at 7:15 AM for registration Will START at 8:00 AM Every 1st & 2nd Friday of the month: Please ARRIVE at 10:15 AM for registration Will START at 1 PM - 5 PM  Psychiatrist for medication management: Monday - Friday:  Please ARRIVE at 7:15 AM for registration Will START at 8:00 AM  Regretfully, due to limited availability, please be aware that you may not been seen on the same day as walk-in. Please consider making an appoint or try again. Thank you for your patience and understanding.

## 2022-07-17 NOTE — Discharge Planning (Signed)
LCSW received update on Wednesday that patient can present to [facility] on 11/27 if MD can provide note that patient is no longer on taper. MD provided update and reports she will write not and fax to facility when able. No other needs to report at this time.Patient to provided update.   Fernande Boyden, LCSW Clinical Social Worker [facility] Ph: [phone]

## 2022-08-10 ENCOUNTER — Inpatient Hospital Stay: Payer: Medicaid Other | Admitting: Sports Medicine

## 2022-10-08 ENCOUNTER — Encounter (HOSPITAL_COMMUNITY): Payer: Self-pay | Admitting: Psychiatry

## 2022-10-08 ENCOUNTER — Ambulatory Visit (INDEPENDENT_AMBULATORY_CARE_PROVIDER_SITE_OTHER): Payer: Medicaid Other | Admitting: Psychiatry

## 2022-10-08 VITALS — BP 131/81 | Temp 98.6°F | Ht 71.0 in | Wt 230.6 lb

## 2022-10-08 DIAGNOSIS — Z0000 Encounter for general adult medical examination without abnormal findings: Secondary | ICD-10-CM

## 2022-10-08 DIAGNOSIS — F1011 Alcohol abuse, in remission: Secondary | ICD-10-CM

## 2022-10-08 DIAGNOSIS — F32A Depression, unspecified: Secondary | ICD-10-CM

## 2022-10-08 DIAGNOSIS — F1111 Opioid abuse, in remission: Secondary | ICD-10-CM

## 2022-10-08 DIAGNOSIS — F411 Generalized anxiety disorder: Secondary | ICD-10-CM | POA: Diagnosis not present

## 2022-10-08 DIAGNOSIS — F17200 Nicotine dependence, unspecified, uncomplicated: Secondary | ICD-10-CM

## 2022-10-08 MED ORDER — VENLAFAXINE HCL ER 75 MG PO CP24: 75.0000 mg | ORAL_CAPSULE | Freq: Every day | ORAL | 3 refills | Status: DC

## 2022-10-08 MED ORDER — NALTREXONE HCL 50 MG PO TABS: 50.0000 mg | ORAL_TABLET | Freq: Every day | ORAL | 3 refills | Status: DC

## 2022-10-08 MED ORDER — NICOTINE 21 MG/24HR TD PT24: 21.0000 mg | MEDICATED_PATCH | Freq: Every day | TRANSDERMAL | 3 refills | Status: DC

## 2022-10-08 MED ORDER — PROPRANOLOL HCL 10 MG PO TABS: 10.0000 mg | ORAL_TABLET | Freq: Three times a day (TID) | ORAL | 3 refills | Status: DC

## 2022-10-08 MED ORDER — TRAZODONE HCL 100 MG PO TABS: 100.0000 mg | ORAL_TABLET | Freq: Every day | ORAL | 3 refills | Status: DC

## 2022-10-08 MED ORDER — NICOTINE POLACRILEX 4 MG MT GUM: 4.0000 mg | CHEWING_GUM | OROMUCOSAL | 3 refills | Status: DC | PRN

## 2022-10-08 MED ORDER — HYDROXYZINE HCL 25 MG PO TABS: 25.0000 mg | ORAL_TABLET | Freq: Three times a day (TID) | ORAL | 3 refills | Status: DC | PRN

## 2022-10-08 MED ORDER — MELATONIN 3 MG PO TABS: 3.0000 mg | ORAL_TABLET | Freq: Every day | ORAL | 3 refills | Status: DC
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
## 2022-12-17 ENCOUNTER — Encounter: Payer: Self-pay | Admitting: Sports Medicine

## 2022-12-17 ENCOUNTER — Ambulatory Visit: Payer: Managed Care, Other (non HMO)

## 2022-12-17 ENCOUNTER — Ambulatory Visit: Payer: Managed Care, Other (non HMO) | Admitting: Sports Medicine

## 2022-12-17 VITALS — BP 130/72 | HR 97

## 2022-12-17 DIAGNOSIS — E291 Testicular hypofunction: Secondary | ICD-10-CM

## 2022-12-17 DIAGNOSIS — F411 Generalized anxiety disorder: Secondary | ICD-10-CM

## 2022-12-17 DIAGNOSIS — G8929 Other chronic pain: Secondary | ICD-10-CM

## 2022-12-17 DIAGNOSIS — M87052 Idiopathic aseptic necrosis of left femur: Secondary | ICD-10-CM | POA: Insufficient documentation

## 2022-12-17 DIAGNOSIS — F419 Anxiety disorder, unspecified: Secondary | ICD-10-CM | POA: Diagnosis not present

## 2022-12-17 DIAGNOSIS — R200 Anesthesia of skin: Secondary | ICD-10-CM

## 2022-12-17 DIAGNOSIS — Z0000 Encounter for general adult medical examination without abnormal findings: Secondary | ICD-10-CM | POA: Diagnosis not present

## 2022-12-17 DIAGNOSIS — M25552 Pain in left hip: Secondary | ICD-10-CM | POA: Diagnosis not present

## 2022-12-17 DIAGNOSIS — F5101 Primary insomnia: Secondary | ICD-10-CM

## 2022-12-17 DIAGNOSIS — S92901S Unspecified fracture of right foot, sequela: Secondary | ICD-10-CM

## 2022-12-17 DIAGNOSIS — R202 Paresthesia of skin: Secondary | ICD-10-CM

## 2022-12-17 DIAGNOSIS — I10 Essential (primary) hypertension: Secondary | ICD-10-CM

## 2022-12-17 DIAGNOSIS — M25562 Pain in left knee: Secondary | ICD-10-CM

## 2022-12-17 MED ORDER — AMLODIPINE BESYLATE 5 MG PO TABS: 5.0000 mg | ORAL_TABLET | Freq: Every day | ORAL | 3 refills | Status: DC

## 2022-12-17 MED ORDER — MELOXICAM 15 MG PO TABS: ORAL_TABLET | ORAL | 3 refills | Status: DC

## 2022-12-17 MED ORDER — "SYRINGE 18G X 1-1/2"" 3 ML MISC": 11 refills | Status: DC

## 2022-12-17 MED ORDER — VENLAFAXINE HCL ER 37.5 MG PO CP24: 37.5000 mg | ORAL_CAPSULE | Freq: Every day | ORAL | 3 refills | Status: DC

## 2022-12-17 MED ORDER — QUETIAPINE FUMARATE 25 MG PO TABS: ORAL_TABLET | ORAL | 3 refills | Status: DC

## 2022-12-17 MED ORDER — TESTOSTERONE CYPIONATE 200 MG/ML IM SOLN: 200.0000 mg | INTRAMUSCULAR | 0 refills | Status: DC

## 2022-12-17 MED ORDER — "NEEDLE (DISP) 22G X 1-1/2"" MISC": 11 refills | Status: DC

## 2022-12-17 NOTE — Assessment & Plan Note (Signed)
Restarting testosterone, will do this for 3 shots followed by CBC, PSA, testosterone levels.

## 2022-12-17 NOTE — Assessment & Plan Note (Signed)
Patient will discuss this with his orthopedist.

## 2022-12-17 NOTE — Assessment & Plan Note (Signed)
History of right foot fracture, ORIF, he will discuss complications with Dr. Susa Simmonds.

## 2022-12-17 NOTE — Progress Notes (Addendum)
    Procedures performed today:    None.  Independent interpretation of notes and tests performed by another provider:   None.  Brief History, Exam, Impression, and Recommendations:    Annual physical exam Philip Cuevas wanted to discuss multiple problems today so we were unable to accomplish any screening measures, he will return for an annual physical.  Avascular necrosis of hip, left (HCC) Pain localized in the groin with mechanical symptoms, gelling, he was a heavy drinker, differential includes labral tear, AVN, arthritis, adding meloxicam, x-rays, home conditioning, will do this for 4 to 6 weeks before considering injection/MR arthrogram.  Update: X-rays confirm AVN, adding Fosamax to delay collapse of the femoral head.  GAD Anxiety and depression, he is doing well, he would like to taper down off of his Effexor, dropping to 37.5 mg, will do this for 4 to 6 weeks for considering discontinuance.   HTN (hypertension) Currently well-controlled on amlodipine 5, refilling.  Male hypogonadism Restarting testosterone, will do this for 3 shots followed by CBC, PSA, testosterone levels.  Numbness and tingling of right leg Numbness and tingling right leg predominantly plantar foot and middle toes present for several years now. Unclear etiology, proceed with nerve conduction and EMG.  Left knee pain with intra-articular loose body Patient will discuss this with his orthopedist.  Fracture, foot, right, sequela History of right foot fracture, ORIF, he will discuss complications with Dr. Susa Simmonds.  I spent 40 minutes of total time managing this patient today, this includes chart review, face to face, and non-face to face time.  ____________________________________________ Philip Cuevas. Philip Cuevas, M.D., ABFM., CAQSM., AME. Primary Care and Sports Medicine  [facility]  Adjunct Professor of Family Medicine  [employer]  [place]

## 2022-12-17 NOTE — Assessment & Plan Note (Signed)
Avoiding all controlled substances, adding Seroquel 25, may titrate to 50 mg if no better in a week.

## 2022-12-17 NOTE — Assessment & Plan Note (Signed)
Currently well-controlled on amlodipine 5, refilling.

## 2022-12-17 NOTE — Assessment & Plan Note (Signed)
Numbness and tingling right leg predominantly plantar foot and middle toes present for several years now. Unclear etiology, proceed with nerve conduction and EMG.

## 2022-12-17 NOTE — Assessment & Plan Note (Signed)
Anxiety and depression, he is doing well, he would like to taper down off of his Effexor, dropping to 37.5 mg, will do this for 4 to 6 weeks for considering discontinuance.

## 2022-12-17 NOTE — Assessment & Plan Note (Signed)
Philip Cuevas wanted to discuss multiple problems today so we were unable to accomplish any screening measures, he will return for an annual physical.

## 2022-12-17 NOTE — Assessment & Plan Note (Addendum)
Pain localized in the groin with mechanical symptoms, gelling, he was a heavy drinker, differential includes labral tear, AVN, arthritis, adding meloxicam, x-rays, home conditioning, will do this for 4 to 6 weeks before considering injection/MR arthrogram.  Update: X-rays confirm AVN, adding Fosamax to delay collapse of the femoral head.

## 2022-12-18 ENCOUNTER — Encounter: Payer: Managed Care, Other (non HMO) | Admitting: Physician Assistant

## 2022-12-18 ENCOUNTER — Telehealth: Payer: Managed Care, Other (non HMO) | Admitting: Family Medicine

## 2022-12-18 DIAGNOSIS — B9689 Other specified bacterial agents as the cause of diseases classified elsewhere: Secondary | ICD-10-CM | POA: Diagnosis not present

## 2022-12-18 DIAGNOSIS — J019 Acute sinusitis, unspecified: Secondary | ICD-10-CM | POA: Diagnosis not present

## 2022-12-18 DIAGNOSIS — J301 Allergic rhinitis due to pollen: Secondary | ICD-10-CM

## 2022-12-18 MED ORDER — PREDNISONE 20 MG PO TABS: 20.0000 mg | ORAL_TABLET | Freq: Two times a day (BID) | ORAL | 0 refills | Status: AC

## 2022-12-18 MED ORDER — AMOXICILLIN-POT CLAVULANATE 875-125 MG PO TABS: 1.0000 | ORAL_TABLET | Freq: Two times a day (BID) | ORAL | 0 refills | Status: DC

## 2022-12-18 NOTE — Patient Instructions (Signed)
Allergic Rhinitis, Adult  Allergic rhinitis is an allergic reaction that affects the mucous membrane inside the nose. The mucous membrane is the tissue that produces mucus. There are two types of allergic rhinitis: Seasonal. This type is also called hay fever and happens only during certain seasons. Perennial. This type can happen at any time of the year. Allergic rhinitis cannot be spread from person to person. This condition can be mild, bad, or very bad. It can develop at any age and may be outgrown. What are the causes? This condition is caused by allergens. These are things that can cause an allergic reaction. Allergens may differ for seasonal allergic rhinitis and perennial allergic rhinitis. Seasonal allergic rhinitis is caused by pollen. Pollen can come from grasses, trees, and weeds. Perennial allergic rhinitis may be caused by: Dust mites. Proteins in a pet's pee (urine), saliva, or dander. Dander is dead skin cells from a pet. Smoke, mold, or car fumes. Remains of or waste from insects such as cockroaches. What increases the risk? You are more likely to develop this condition if you have a family history of allergies or other conditions related to allergies, including: Allergic conjunctivitis. This is irritation and swelling of parts of the eyes and eyelids. Asthma. This condition affects the lungs and makes it hard to breathe. Atopic dermatitis or eczema. This is long term (chronic) irritation and swelling of the skin. Food allergies. What are the signs or symptoms? Symptoms of this condition include: Sneezing or coughing. A stuffy nose (nasal congestion), itchy nose, or nasal discharge. Itchy eyes and tearing of the eyes. A feeling of mucus dripping down the back of your throat (postnasal drip). This may cause a sore throat. Trouble sleeping. Tiredness. Headache. How is this diagnosed? This condition may be diagnosed with your symptoms, your medical history, and a physical  exam. Your health care provider may check for related conditions, such as: Asthma. Pink eye. This is eye swelling and irritation caused by infection (conjunctivitis). Ear infection. Upper respiratory infection. This is an infection in the nose, throat, or upper airways. You may also have tests to find out which allergens cause your symptoms. These may include skin tests or blood tests. How is this treated? There is no cure for this condition, but treatment can help control symptoms. Treatment may include: Taking medicines that block allergy symptoms, such as corticosteroids (anti-inflammatories) and antihistamines. Medicine may be given as a shot, nasal spray, or pill. Avoiding any allergens. Being exposed again and again to tiny amounts of allergens to help you build a defense against allergens (allergenimmunotherapy). This is done if other treatments have not helped. It may include: Allergy shots. These are injected medicines that have small amounts of an allergen in them. Sublingual immunotherapy. This involves taking small doses of a medicine with an allergen in it under your tongue. If these treatments do not work, your provider may prescribe newer, stronger medicines. Follow these instructions at home: Avoiding allergens Find out what you are allergic to and avoid those allergens. These are some things you can do to help avoid allergens: If you have perennial allergies: Replace carpet with wood, tile, or vinyl flooring. Carpet can trap dander and dust. Do not smoke. Do not allow smoking in your home Change your heating and air conditioning filters at least once a month. If you have seasonal allergies, take these steps during allergy season: Keep windows closed as much as possible. Plan outdoor activities when pollen counts are lowest. Check pollen counts before   you plan outdoor activities When coming indoors, change clothing and shower before sitting on furniture or bedding. If you  have a pet in the house that produces allergens: Keep the pet out of the bedroom. Vacuum, sweep, and dust regularly. General instructions Take over-the-counter and prescription medicines only as told by your provider. Drink enough fluid to keep your pee pale yellow. Where to find more information American Academy of Allergy, Asthma & Immunology: aaaai.org Contact a health care provider if: You have a fever. You develop a cough that does not go away. You make high-pitched whistling sounds when you breathe, most often when you breathe out (wheeze). Your symptoms slow you down or stop you from doing your normal activities each day. Get help right away if: You have shortness of breath. This symptom may be an emergency. Get help right away. Call 911. Do not wait to see if the symptoms will go away. Do not drive yourself to the hospital. This information is not intended to replace advice given to you by your health care provider. Make sure you discuss any questions you have with your health care provider. Document Revised: 04/20/2022 Document Reviewed: 04/20/2022 Elsevier Patient Education  2023 [employer] Sinus Infection, Adult A sinus infection, also called sinusitis, is inflammation of your sinuses. Sinuses are hollow spaces in the bones around your face. Your sinuses are located: Around your eyes. In the middle of your forehead. Behind your nose. In your cheekbones. Mucus normally drains out of your sinuses. When your nasal tissues become inflamed or swollen, mucus can become trapped or blocked. This allows bacteria, viruses, and fungi to grow, which leads to infection. Most infections of the sinuses are caused by a virus. A sinus infection can develop quickly. It can last for up to 4 weeks (acute) or for more than 12 weeks (chronic). A sinus infection often develops after a cold. What are the causes? This condition is caused by anything that creates swelling in the sinuses or stops  mucus from draining. This includes: Allergies. Asthma. Infection from bacteria or viruses. Deformities or blockages in your nose or sinuses. Abnormal growths in the nose (nasal polyps). Pollutants, such as chemicals or irritants in the air. Infection from fungi. This is rare. What increases the risk? You are more likely to develop this condition if you: Have a weak body defense system (immune system). Do a lot of swimming or diving. Overuse nasal sprays. Smoke. What are the signs or symptoms? The main symptoms of this condition are pain and a feeling of pressure around the affected sinuses. Other symptoms include: Stuffy nose or congestion that makes it difficult to breathe through your nose. Thick yellow or greenish drainage from your nose. Tenderness, swelling, and warmth over the affected sinuses. A cough that may get worse at night. Decreased sense of smell and taste. Extra mucus that collects in the throat or the back of the nose (postnasal drip) causing a sore throat or bad breath. Tiredness (fatigue). Fever. How is this diagnosed? This condition is diagnosed based on: Your symptoms. Your medical history. A physical exam. Tests to find out if your condition is acute or chronic. This may include: Checking your nose for nasal polyps. Viewing your sinuses using a device that has a light (endoscope). Testing for allergies or bacteria. Imaging tests, such as an MRI or CT scan. In rare cases, a bone biopsy may be done to rule out more serious types of fungal sinus disease. How is this treated? Treatment for a   sinus infection depends on the cause and whether your condition is chronic or acute. If caused by a virus, your symptoms should go away on their own within 10 days. You may be given medicines to relieve symptoms. They include: Medicines that shrink swollen nasal passages (decongestants). A spray that eases inflammation of the nostrils (topical intranasal  corticosteroids). Rinses that help get rid of thick mucus in your nose (nasal saline washes). Medicines that treat allergies (antihistamines). Over-the-counter pain relievers. If caused by bacteria, your health care provider may recommend waiting to see if your symptoms improve. Most bacterial infections will get better without antibiotic medicine. You may be given antibiotics if you have: A severe infection. A weak immune system. If caused by narrow nasal passages or nasal polyps, surgery may be needed. Follow these instructions at home: Medicines Take, use, or apply over-the-counter and prescription medicines only as told by your health care provider. These may include nasal sprays. If you were prescribed an antibiotic medicine, take it as told by your health care provider. Do not stop taking the antibiotic even if you start to feel better. Hydrate and humidify  Drink enough fluid to keep your urine pale yellow. Staying hydrated will help to thin your mucus. Use a cool mist humidifier to keep the humidity level in your home above 50%. Inhale steam for 10-15 minutes, 3-4 times a day, or as told by your health care provider. You can do this in the bathroom while a hot shower is running. Limit your exposure to cool or dry air. Rest Rest as much as possible. Sleep with your head raised (elevated). Make sure you get enough sleep each night. General instructions  Apply a warm, moist washcloth to your face 3-4 times a day or as told by your health care provider. This will help with discomfort. Use nasal saline washes as often as told by your health care provider. Wash your hands often with soap and water to reduce your exposure to germs. If soap and water are not available, use hand sanitizer. Do not smoke. Avoid being around people who are smoking (secondhand smoke). Keep all follow-up visits. This is important. Contact a health care provider if: You have a fever. Your symptoms get  worse. Your symptoms do not improve within 10 days. Get help right away if: You have a severe headache. You have persistent vomiting. You have severe pain or swelling around your face or eyes. You have vision problems. You develop confusion. Your neck is stiff. You have trouble breathing. These symptoms may be an emergency. Get help right away. Call 911. Do not wait to see if the symptoms will go away. Do not drive yourself to the hospital. Summary A sinus infection is soreness and inflammation of your sinuses. Sinuses are hollow spaces in the bones around your face. This condition is caused by nasal tissues that become inflamed or swollen. The swelling traps or blocks the flow of mucus. This allows bacteria, viruses, and fungi to grow, which leads to infection. If you were prescribed an antibiotic medicine, take it as told by your health care provider. Do not stop taking the antibiotic even if you start to feel better. Keep all follow-up visits. This is important. This information is not intended to replace advice given to you by your health care provider. Make sure you discuss any questions you have with your health care provider. Document Revised: 07/15/2021 Document Reviewed: 07/15/2021 Elsevier Patient Education  2023 Else[employer] Inc.  

## 2022-12-18 NOTE — Telephone Encounter (Signed)
This encounter was created in error - please disregard.

## 2022-12-18 NOTE — Progress Notes (Signed)
Virtual Visit Consent   Philip Cuevas, you are scheduled for a virtual visit with a [facility] provider today. Just as with appointments in the office, your consent must be obtained to participate. Your consent will be active for this visit and any virtual visit you may have with one of our providers in the next 365 days. If you have a MyChart account, a copy of this consent can be sent to you electronically.  As this is a virtual visit, video technology does not allow for your provider to perform a traditional examination. This may limit your provider's ability to fully assess your condition. If your provider identifies any concerns that need to be evaluated in person or the need to arrange testing (such as labs, EKG, etc.), we will make arrangements to do so. Although advances in technology are sophisticated, we cannot ensure that it will always work on either your end or our end. If the connection with a video visit is poor, the visit may have to be switched to a telephone visit. With either a video or telephone visit, we are not always able to ensure that we have a secure connection.  By engaging in this virtual visit, you consent to the provision of healthcare and authorize for your insurance to be billed (if applicable) for the services provided during this visit. Depending on your insurance coverage, you may receive a charge related to this service.  I need to obtain your verbal consent now. Are you willing to proceed with your visit today? Philip Cuevas has provided verbal consent on 12/18/2022 for a virtual visit (video or telephone). Philip Curio, FNP  Date: 12/18/2022 6:07 PM  Virtual Visit via Video Note   I, Philip Cuevas, connected with  Philip Cuevas  ([id number], [birth year]) on 12/18/22 at  6:00 PM EDT by a video-enabled telemedicine application and verified that I am speaking with the correct person using two identifiers.  Location: Patient: Home Provider: Home office    I discussed the limitations of evaluation and management by telemedicine and the availability of in person appointments. The patient expressed understanding and agreed to proceed.    History of Present Illness: Philip Cuevas is a 42 y.o. who identifies as a male who was assigned male at birth, and is being seen today for severe runny nose, irritation, cough, doing in home covid test during visit, fever, fatigued and achy. Exposed to covid at work this week.   HPI: HPI  Problems:  Patient Active Problem List   Diagnosis Date Noted   Annual physical exam 12/17/2022   Chronic left hip pain 12/17/2022   Numbness and tingling of right leg 12/17/2022   Fracture, foot, right, sequela 12/17/2022   Alcohol use disorder 07/14/2022   Narcotic withdrawal (HCC) 07/12/2022   Tachycardia 07/11/2022   Polysubstance abuse (HCC) 07/10/2022   Alcohol withdrawal delirium (HCC) 07/07/2022   Alcohol withdrawal (HCC) 07/06/2022   Throat pain 07/06/2022   Poor social situation 07/06/2022   Opioid use disorder 07/06/2022   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   History of migraine headaches 08/29/2018   Venous stasis dermatitis of both lower extremities 12/22/2017   Morbid obesity (HCC) 04/30/2017   Obstructive sleep apnea 04/30/2017   Hypertriglyceridemia 03/03/2017   Chronic epigastric pain 03/02/2017   Left knee pain with intra-articular loose body 05/02/2012   HTN (hypertension) 05/07/2008   Erectile dysfunction 07/06/2007   MDD (major depressive disorder), severe (HCC) 11/24/2006   ASTHMA, EXERCISE INDUCED 11/24/2006   Insomnia  06/04/2006   Tobacco use disorder 06/04/2006   GAD 06/01/2006    Allergies: No Known Allergies Medications:  Current Outpatient Medications:    amLODipine (NORVASC) 5 MG tablet, Take 1 tablet (5 mg total) by mouth daily., Disp: 90 tablet, Rfl: 3   meloxicam (MOBIC) 15 MG tablet, One tab PO every 24 hours with a meal for 2 weeks, then once every 24 hours prn pain., Disp:  30 tablet, Rfl: 3   NEEDLE, DISP, 22 G 22G X 1-1/2" MISC, Use every 2 weeks for testosterone injections, Disp: 50 each, Rfl: 11   propranolol (INDERAL) 10 MG tablet, Take 1 tablet (10 mg total) by mouth daily as needed., Disp: , Rfl:    QUEtiapine (SEROQUEL) 25 MG tablet, 1 tablet p.o. nightly, may increase to 2 tablets p.o. nightly after a week if insufficient efficacy, Disp: 30 tablet, Rfl: 3   Syringe/Needle, Disp, (SYRINGE 3CC/18GX1-1/2") 18G X 1-1/2" 3 ML MISC, Use every 2 weeks for testosterone injection, Disp: 50 each, Rfl: 11   testosterone cypionate (DEPOTESTOSTERONE CYPIONATE) 200 MG/ML injection, Inject 1 mL (200 mg total) into the muscle every 14 (fourteen) days., Disp: 10 mL, Rfl: 0   venlafaxine XR (EFFEXOR XR) 37.5 MG 24 hr capsule, Take 1 capsule (37.5 mg total) by mouth daily with breakfast., Disp: 30 capsule, Rfl: 3  Observations/Objective: Patient is well-developed, well-nourished in no acute distress.  Resting comfortably  at home.  Head is normocephalic, atraumatic.  No labored breathing.  Speech is clear and coherent with logical content.  Patient is alert and oriented at baseline.    Assessment and Plan: 1. Allergic rhinitis due to pollen, unspecified seasonality  Start flonase and allegra otc, UC if sx worsen.   Follow Up Instructions: I discussed the assessment and treatment plan with the patient. The patient was provided an opportunity to ask questions and all were answered. The patient agreed with the plan and demonstrated an understanding of the instructions.  A copy of instructions were sent to the patient via MyChart unless otherwise noted below.     The patient was advised to call back or seek an in-person evaluation if the symptoms worsen or if the condition fails to improve as anticipated.  Time:  I spent 8 minutes with the patient via telehealth technology discussing the above problems/concerns.    Philip Curio, FNP

## 2022-12-18 NOTE — Progress Notes (Signed)
Duplicate/ Erroneous

## 2022-12-22 MED ORDER — ALENDRONATE SODIUM 70 MG PO TABS: 70.0000 mg | ORAL_TABLET | ORAL | 11 refills | Status: DC

## 2022-12-22 NOTE — Addendum Note (Signed)
Addended by: Monica Becton on: 12/22/2022 02:15 PM   Modules accepted: Orders

## 2022-12-30 ENCOUNTER — Telehealth (INDEPENDENT_AMBULATORY_CARE_PROVIDER_SITE_OTHER): Payer: Medicaid Other | Admitting: Psychiatry

## 2022-12-30 ENCOUNTER — Encounter (HOSPITAL_COMMUNITY): Payer: Self-pay | Admitting: Psychiatry

## 2022-12-30 DIAGNOSIS — F411 Generalized anxiety disorder: Secondary | ICD-10-CM | POA: Diagnosis not present

## 2022-12-30 DIAGNOSIS — F5101 Primary insomnia: Secondary | ICD-10-CM | POA: Diagnosis not present

## 2022-12-30 DIAGNOSIS — F419 Anxiety disorder, unspecified: Secondary | ICD-10-CM

## 2022-12-30 MED ORDER — VENLAFAXINE HCL ER 37.5 MG PO CP24: 37.5000 mg | ORAL_CAPSULE | Freq: Every day | ORAL | 3 refills | Status: DC

## 2022-12-30 MED ORDER — QUETIAPINE FUMARATE 25 MG PO TABS: ORAL_TABLET | ORAL | 3 refills | Status: DC

## 2022-12-30 MED ORDER — PROPRANOLOL HCL 10 MG PO TABS: 10.0000 mg | ORAL_TABLET | Freq: Every day | ORAL | 3 refills | Status: DC | PRN

## 2022-12-30 NOTE — Progress Notes (Signed)
BH MD/PA/NP OP Progress Note Virtual Visit via Video Note  I connected with Philip Cuevas on 12/30/22 at 10:00 AM EDT by a video enabled telemedicine application and verified that I am speaking with the correct person using two identifiers.  Location: Patient: Work Provider: Clinic   I discussed the limitations of evaluation and management by telemedicine and the availability of in person appointments. The patient expressed understanding and agreed to proceed.  I provided 30 minutes of non-face-to-face time during this encounter.   12/30/2022 10:27 AM JIBRAN LEHL  MRN:  [id number]  Chief Complaint: "I am doing really good"  History of Present Illness: 42 year old male seen today for follow-up psychiatric evaluation. He has a psychiatric history of poly substance use (alcohol, heroin, cocaine, marijuana and methamphetamines), anxiety and depression.  He is currently being managed on propranolol 10 mg 3 times daily, hydroxyzine 25 mg 3 times daily as needed, Effexor 75 mg daily, naltrexone 50 mg daily, NicoDerm CQ 21 mg patches daily, Nicorette 4 mg gum as needed, and trazodone 100 and mg nightly as needed.  Patient informed writer that he is no longer taking hydroxyzine, naltrexone, Nicorette gum, NicoDerm patches, or trazodone.  He informed Clinical research associate that these medications were adjusted by his PCP.  He is now managed on Seroquel 25 mg nightly, Effexor 37.5 mg daily, and propranolol 10 mg daily as needed.  He reports his medications are effective in managing his psychiatric condition.   Today he is well-groomed, pleasant, cooperative, and engaged in conversation.  He informed Clinical research associate that since his last visit he has been doing really good.  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  Patient informed Clinical research associate that he continues to be sober and notes that he has maintained his sobriety for 7 months.  He reports that he has been off of naltrexone for 3 months.  He  informed Clinical research associate that he attends AA and NA meetings daily.  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  Patient informed writer that his mood is stable and reports that he has minimal anxiety and depression.  Today provider conducted a GAD-7 and patient scored a 4, at his last visit he scored a 7.  Provider also conducted PHQ-9 patient scored a 2, at his last visit he scored a 7.  He endorses adequate sleep and appetite.  Today he denies SI/HI/VAH, mania, paranoia.    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  He also notes that he has right foot pain and pain in both knees.  He informed Clinical research associate that he is managed with Fosamax to help manage this pain.    No medication changes made today.  Patient agreeable to continue medications as prescribed.  He informed Clinical research associate that his primary care doctor will fill his psychiatric medications.  At this time patient reports that he is no longer in need of behavioral health services.  He will follow-up with outpatient counseling for therapy.  No other concerns at this time.    Visit Diagnosis:    ICD-10-CM   1. Anxiety  F41.9 venlafaxine XR (EFFEXOR XR) 37.5 MG 24 hr capsule    2. Primary insomnia  F51.01 QUEtiapine (SEROQUEL) 25 MG tablet    3. Generalized anxiety disorder  F41.1  propranolol (INDERAL) 10 MG tablet      Past Psychiatric History:  poly substance use (alcohol, heroin, cocaine, marijuana and methamphetamines), anxiety and depression.   Past Medical History:  Past Medical History:  Diagnosis Date   Anxiety    Asthma    mild, intermittent   Depression    Hypertension    Knee injury    Tobacco use disorder 06/04/2006   Qualifier: Diagnosis of   By: Thomos Lemons        Past Surgical History:  Procedure Laterality Date   DENTAL SURGERY     PILONIDAL CYST EXCISION N/A  04/12/2015   Procedure: CYST EXCISION PILONIDAL SIMPLE;  Surgeon: Franky Macho, MD;  Location: [facility];  Service: General;  Laterality: N/A;   TONSILECTOMY, ADENOIDECTOMY, BILATERAL MYRINGOTOMY AND TUBES      Family Psychiatric History: Father substance use, mother alcohol use, maternal grand mother alcohol use, maternal aunt alcohol use, father schizophrenia, paternal grand father schizophrenia, paternal aunt schizophrenia, daughter anxiety and depression   Family History:  Family History  Problem Relation Age of Onset   Hypertension Mother    Hyperlipidemia Mother    Depression Father        committed suicide    Social History:  Social History   Socioeconomic History   Marital status: Married    Spouse name: Not on file   Number of children: Not on file   Years of education: Not on file   Highest education level: Not on file  Occupational History   Not on file  Tobacco Use   Smoking status: Some Days    Packs/day: 0.30    Years: 4.00    Additional pack years: 0.00    Total pack years: 1.20    Types: Cigarettes    Last attempt to quit: 10/22/2005    Years since quitting: 17.2   Smokeless tobacco: Never  Substance and Sexual Activity   Alcohol use: Yes    Comment: 2 drinks a day   Drug use: Yes    Comment: snorts heroin   Sexual activity: Not on file    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, 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, 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  Other Topics Concern   Not on file  Social History Narrative   Not on file   Social Determinants of Health   Financial Resource Strain: Not on file  Food Insecurity: Not on file  Transportation Needs: Not on file  Physical Activity: Not on file  Stress: Not on file  Social Connections: Not on file    Allergies: No Known Allergies  Metabolic Disorder Labs: Lab Results  Component Value Date   HGBA1C 5.4 07/14/2022   MPG 108.28 07/14/2022   MPG 103 06/17/2021   No results found for: "PROLACTIN" Lab Results   Component Value Date   CHOL 199 07/14/2022   TRIG 180 (H) 07/14/2022   HDL 40 (L) 07/14/2022   CHOLHDL 5.0 07/14/2022   VLDL 36 07/14/2022   LDLCALC 123 (H) 07/14/2022   LDLCALC  06/17/2021     Comment:     . LDL cholesterol not calculated. Triglyceride levels greater than 400 mg/dL invalidate calculated LDL results. . Reference range: <100 . Desirable range <100 mg/dL for primary prevention;   <70 mg/dL for patients with CHD or diabetic patients  with > or = 2 CHD risk factors. Marland Kitchen LDL-C is now calculated using the Martin-Hopkins  calculation, which is a validated novel method providing  better  accuracy than the Friedewald equation in the  estimation of LDL-C.  Horald Pollen et al. Lenox Ahr. 9604;540(98): 2061-2068  (http://education.[facility].com/faq/FAQ164)    Lab Results  Component Value Date   TSH 1.785 07/14/2022   TSH 1.89 06/17/2021    Therapeutic Level Labs: No results found for: "LITHIUM" No results found for: "VALPROATE" No results found for: "CBMZ"  Current Medications: Current Outpatient Medications  Medication Sig Dispense Refill   alendronate (FOSAMAX) 70 MG tablet Take 1 tablet (70 mg total) by mouth every 7 (seven) days. 4 tablet 11   amLODipine (NORVASC) 5 MG tablet Take 1 tablet (5 mg total) by mouth daily. 90 tablet 3   amoxicillin-clavulanate (AUGMENTIN) 875-125 MG tablet Take 1 tablet by mouth 2 (two) times daily. 20 tablet 0   meloxicam (MOBIC) 15 MG tablet One tab PO every 24 hours with a meal for 2 weeks, then once every 24 hours prn pain. 30 tablet 3   NEEDLE, DISP, 22 G 22G X 1-1/2" MISC Use every 2 weeks for testosterone injections 50 each 11   propranolol (INDERAL) 10 MG tablet Take 1 tablet (10 mg total) by mouth daily as needed. 30 tablet 3   QUEtiapine (SEROQUEL) 25 MG tablet 1 tablet p.o. nightly, may increase to 2 tablets p.o. nightly after a week if insufficient efficacy 30 tablet 3   Syringe/Needle, Disp, (SYRINGE 3CC/18GX1-1/2") 18G  X 1-1/2" 3 ML MISC Use every 2 weeks for testosterone injection 50 each 11   testosterone cypionate (DEPOTESTOSTERONE CYPIONATE) 200 MG/ML injection Inject 1 mL (200 mg total) into the muscle every 14 (fourteen) days. 10 mL 0   venlafaxine XR (EFFEXOR XR) 37.5 MG 24 hr capsule Take 1 capsule (37.5 mg total) by mouth daily with breakfast. 30 capsule 3   No current facility-administered medications for this visit.     Musculoskeletal: Strength & Muscle Tone: within normal limits Gait & Station: normal Patient leans: N/A  Psychiatric Specialty Exam: Review of Systems  There were no vitals taken for this visit.There is no height or weight on file to calculate BMI.  General Appearance: Well Groomed  Eye Contact:  Good  Speech:  Clear and Coherent and Pressured  Volume:  Normal  Mood:  Euthymic  Affect:  Appropriate and Congruent  Thought Process:  Coherent, Goal Directed, and Linear  Orientation:  Full (Time, Place, and Person)  Thought Content: WDL and Logical   Suicidal Thoughts:  No  Homicidal Thoughts:  No  Memory:  Immediate;   Good Recent;   Good Remote;   Good  Judgement:  Good  Insight:  Good  Psychomotor Activity:  Normal  Concentration:  Concentration: Good and Attention Span: Good  Recall:  Good  Fund of Knowledge: Good  Language: Good  Akathisia:  No  Handed:  Right  AIMS (if indicated): not done  Assets:  Communication Skills Desire for Improvement Financial Resources/Insurance Housing Intimacy Physical Health Social Support Talents/Skills Transportation  ADL's:  Intact  Cognition: WNL  Sleep:  Good   Screenings: AUDIT    Flowsheet Row Office Visit from 10/08/2022 in [facility]  Alcohol Use Disorder Identification Test Final Score (AUDIT) 20      GAD-7    Flowsheet Row Video Visit from 12/30/2022 in [facility] Office Visit from 10/08/2022 in [facility]  Visit from 06/17/2021 in [facility] Primary Care & Sports Medicine at [facility]  Total GAD-7 Score 4 7 14  [id number]    Flowsheet Row Video Visit from 12/30/2022 in [facility] Office Visit from 10/08/2022 in [facility] ED from 07/14/2022 in [facility] Office Visit from 06/17/2021 in [facility] Primary Care & Sports Medicine at [facility] Office Visit from 04/30/2017 in [facility] Primary Care & Sports Medicine at [facility] Total Score 0 3 3 2 2   PHQ-9 Total Score 2 7 8 8  --      Flowsheet Row ED from 07/14/2022 in [facility] ED to Hosp-Admission (Discharged) from 07/06/2022 in [facility] ED from 05/22/2021 in [facility] Emergency Department at [facility]  C-SSRS RISK CATEGORY No Risk No Risk No Risk        Assessment and Plan: Patient's medications were recently adjusted and he notes that he is doing well.No medication changes made today.  Patient agreeable to continue medications as prescribed.  He informed Clinical research associate that his primary care doctor will fill his psychiatric medications.  At this time patient reports that he is no longer in need of behavioral health services.  He will follow-up with outpatient counseling for therapy.    1. Anxiety  Continue- venlafaxine XR (EFFEXOR XR) 37.5 MG 24 hr capsule; Take 1 capsule (37.5 mg total) by mouth daily with breakfast.  Dispense: 30 capsule; Refill: 3  2. Primary insomnia  Continue- QUEtiapine (SEROQUEL) 25 MG tablet; 1 tablet p.o. nightly, may increase to 2 tablets p.o. nightly after a week if insufficient efficacy  Dispense: 30 tablet; Refill: 3  3. Generalized anxiety disorder  Continue- propranolol (INDERAL) 10 MG tablet; Take 1 tablet (10 mg total) by mouth daily as needed.  Dispense: 30 tablet; Refill: 3   Collaboration of Care:  Collaboration of Care: Other provider involved in patient's care AEB PCP and counselor  Patient/Guardian was advised Release of Information must be obtained prior to any record release in order to collaborate their care with an outside provider. Patient/Guardian was advised if they have not already done so to contact the registration department to sign all necessary forms in order for [place] to release information regarding their care.   Consent: Patient/Guardian gives verbal consent for treatment and assignment of benefits for services provided during this visit. Patient/Guardian expressed understanding and agreed to proceed.   Follow-up as needed   Shanna Cisco, NP 12/30/2022, 10:27 AM

## 2022-12-31 ENCOUNTER — Encounter: Payer: Self-pay | Admitting: Sports Medicine

## 2023-01-01 ENCOUNTER — Telehealth: Payer: Self-pay

## 2023-01-01 NOTE — Telephone Encounter (Signed)
Patient came into office to drop off DMV Handicap Placard forms to be completed by PCP, forms placed in Dr.T's box, thanks.

## 2023-01-01 NOTE — Telephone Encounter (Signed)
It has been completed and is in my box

## 2023-01-25 ENCOUNTER — Encounter (INDEPENDENT_AMBULATORY_CARE_PROVIDER_SITE_OTHER): Payer: Managed Care, Other (non HMO) | Admitting: Sports Medicine

## 2023-01-25 DIAGNOSIS — N529 Male erectile dysfunction, unspecified: Secondary | ICD-10-CM | POA: Diagnosis not present

## 2023-01-25 MED ORDER — TADALAFIL 20 MG PO TABS: 20.0000 mg | ORAL_TABLET | ORAL | 11 refills | Status: DC | PRN

## 2023-01-25 NOTE — Telephone Encounter (Signed)
I spent 5 total minutes of online digital evaluation and management services in this patient-initiated request for online care. 

## 2023-01-28 ENCOUNTER — Encounter: Payer: Managed Care, Other (non HMO) | Admitting: Sports Medicine

## 2023-02-04 ENCOUNTER — Encounter: Payer: Self-pay | Admitting: Sports Medicine

## 2023-02-04 ENCOUNTER — Ambulatory Visit (INDEPENDENT_AMBULATORY_CARE_PROVIDER_SITE_OTHER): Payer: Managed Care, Other (non HMO) | Admitting: Sports Medicine

## 2023-02-04 VITALS — BP 133/80 | HR 87 | Ht 71.0 in | Wt 250.0 lb

## 2023-02-04 DIAGNOSIS — D229 Melanocytic nevi, unspecified: Secondary | ICD-10-CM

## 2023-02-04 DIAGNOSIS — M25562 Pain in left knee: Secondary | ICD-10-CM

## 2023-02-04 DIAGNOSIS — G8929 Other chronic pain: Secondary | ICD-10-CM

## 2023-02-04 DIAGNOSIS — E291 Testicular hypofunction: Secondary | ICD-10-CM

## 2023-02-04 DIAGNOSIS — Z0000 Encounter for general adult medical examination without abnormal findings: Secondary | ICD-10-CM | POA: Diagnosis not present

## 2023-02-04 DIAGNOSIS — M87052 Idiopathic aseptic necrosis of left femur: Secondary | ICD-10-CM | POA: Diagnosis not present

## 2023-02-04 NOTE — Assessment & Plan Note (Signed)
Persistent left knee pain, he does have intra-articular loose bodies, he has had conservative treatment, injections, I have advised him to pursue arthroscopic intervention multiple times over the past several years, he is agreeable now, we will have Dr. Luiz Blare take a look.

## 2023-02-04 NOTE — Assessment & Plan Note (Signed)
Suspicious nevus anterior abdomen, patient returned in a 15-minute slot for a punch biopsy.

## 2023-02-04 NOTE — Progress Notes (Signed)
Subjective:    CC: Annual Physical Exam  HPI:  This patient is here for their annual physical  I reviewed the past medical history, family history, social history, surgical history, and allergies today and no changes were needed.  Please see the problem list section below in epic for further details.  Past Medical History: Past Medical History:  Diagnosis Date   Anxiety    Asthma    mild, intermittent   Depression    Hypertension    Knee injury    Tobacco use disorder 06/04/2006   Qualifier: Diagnosis of   By: Thomos Lemons       Past Surgical History: Past Surgical History:  Procedure Laterality Date   DENTAL SURGERY     PILONIDAL CYST EXCISION N/A 04/12/2015   Procedure: CYST EXCISION PILONIDAL SIMPLE;  Surgeon: Franky Macho, MD;  Location: [facility];  Service: General;  Laterality: N/A;   TONSILECTOMY, ADENOIDECTOMY, BILATERAL MYRINGOTOMY AND TUBES     Social History: Social History   Socioeconomic History   Marital status: Married    Spouse name: Not on file   Number of children: Not on file   Years of education: Not on file   Highest education level: Not on file  Occupational History   Not on file  Tobacco Use   Smoking status: Some Days    Packs/day: 0.30    Years: 4.00    Additional pack years: 0.00    Total pack years: 1.20    Types: Cigarettes    Last attempt to quit: 10/22/2005    Years since quitting: 17.2   Smokeless tobacco: Never  Substance and Sexual Activity   Alcohol use: Yes    Comment: 2 drinks a day   Drug use: Yes    Comment: snorts heroin   Sexual activity: Not on file    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  Review of Systems: No headache, visual changes, nausea, vomiting, diarrhea, constipation, dizziness, abdominal pain, skin rash, fevers, chills, night sweats, swollen lymph nodes, weight loss, chest pain, body aches, joint swelling, muscle aches, shortness of breath, mood changes, visual or auditory hallucinations.  Objective:    General: Well Developed, well nourished, and in no acute distress.  Neuro: Alert and oriented x3, extra-ocular muscles intact, sensation grossly intact. Cranial nerves II through XII are intact, motor, sensory, and coordinative functions are all intact. HEENT: Normocephalic, atraumatic, pupils equal round reactive to light, neck supple, no masses, no lymphadenopathy, thyroid nonpalpable. Oropharynx, nasopharynx, external ear canals are unremarkable. Skin: Warm and dry, no rashes noted.  There is a 1 to 2 cm variegated hyperpigmented skin lesion anterior abdomen. Cardiac: Regular rate and rhythm, no murmurs rubs or gallops.  Respiratory: Clear to auscultation bilaterally. Not using accessory muscles, speaking in full sentences.  Abdominal: Soft, nontender, nondistended,  positive bowel sounds, no masses, no organomegaly.  Musculoskeletal: Shoulder, elbow, wrist, hip, knee, ankle stable, and with full range of motion.  Impression and Recommendations:    The patient was counselled, risk factors were discussed, anticipatory guidance given.  Annual physical exam Annual physical as above, up-to-date on screening measures, except for Tdap, given today. Return to see me in a year.  Left knee pain with intra-articular loose body Persistent left knee pain, he does have intra-articular loose bodies, he has had conservative  treatment, injections, I have advised him to pursue arthroscopic intervention multiple times over the past several years, he is agreeable now, we will have Dr. Luiz Blare take a look.  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 Luiz Blare to weigh in.  Male hypogonadism Continue testosterone, he is now approximate 1 week after his last injection so we will recheck testosterone, PSA, CBC.  Goal testosterone is some between 600 and 800.  Suspicious nevus Suspicious nevus anterior abdomen, patient returned in a 15-minute slot for a punch biopsy.   ____________________________________________ Philip Cuevas. Philip Cuevas, M.D., ABFM., CAQSM., AME. Primary Care and Sports Medicine [facility]  Adjunct Professor of Family Medicine  [employer]  Restaurant manager, fast food

## 2023-02-04 NOTE — Assessment & Plan Note (Signed)
Annual physical as above, up-to-date on screening measures, except for Tdap, given today. Return to see me in a year.

## 2023-02-04 NOTE — Assessment & Plan Note (Signed)
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 Luiz Blare to weigh in.

## 2023-02-04 NOTE — Assessment & Plan Note (Signed)
Continue testosterone, he is now approximate 1 week after his last injection so we will recheck testosterone, PSA, CBC.  Goal testosterone is some between 600 and 800.

## 2023-02-05 ENCOUNTER — Encounter: Payer: Self-pay | Admitting: Sports Medicine

## 2023-02-05 DIAGNOSIS — Z23 Encounter for immunization: Secondary | ICD-10-CM | POA: Diagnosis not present

## 2023-02-05 NOTE — Addendum Note (Signed)
Addended by: Carren Rang A on: 02/05/2023 09:39 AM   Modules accepted: Orders

## 2023-02-08 ENCOUNTER — Other Ambulatory Visit: Payer: Self-pay | Admitting: Sports Medicine

## 2023-02-08 DIAGNOSIS — E291 Testicular hypofunction: Secondary | ICD-10-CM

## 2023-02-08 LAB — CBC
HCT: 45.7 % (ref 38.5–50.0)
Hemoglobin: 16 g/dL (ref 13.2–17.1)
MCH: 30.9 pg (ref 27.0–33.0)
MCHC: 35 g/dL (ref 32.0–36.0)
MCV: 88.2 fL (ref 80.0–100.0)
MPV: 9.3 fL (ref 7.5–12.5)
Platelets: 247 10*3/uL (ref 140–400)
RBC: 5.18 10*6/uL (ref 4.20–5.80)
RDW: 13.5 % (ref 11.0–15.0)
WBC: 6.1 10*3/uL (ref 3.8–10.8)

## 2023-02-08 LAB — PSA, TOTAL AND FREE
PSA, % Free: 50 % (calc) (ref >25)
PSA, Free: 0.2 ng/mL
PSA, Total: 0.4 ng/mL (ref <=4.0)

## 2023-02-08 LAB — TESTOSTERONE, FREE & TOTAL
Free Testosterone: 220.4 pg/mL — ABNORMAL HIGH (ref 35.0–155.0)
Testosterone, Total, LC-MS-MS: 856 ng/dL (ref 250–1100)

## 2023-02-09 MED ORDER — TESTOSTERONE CYPIONATE 200 MG/ML IM SOLN
200.0000 mg | INTRAMUSCULAR | 0 refills | Status: DC
Start: 2023-02-09 — End: 2023-04-27

## 2023-02-18 ENCOUNTER — Ambulatory Visit: Payer: Managed Care, Other (non HMO) | Admitting: Sports Medicine

## 2023-03-01 ENCOUNTER — Encounter: Payer: Self-pay | Admitting: Sports Medicine

## 2023-03-02 NOTE — Telephone Encounter (Signed)
To Weston Brass: would you please call [facility] and leave a message that this patient is clear for surgery and that we can't just fax it for some reason.  To Dr. Juluis Rainier: This patient is optimized from a: Cardiac standpoint and is low risk and cleared for intermediate risk noncardiac surgery.Marland Kitchen

## 2023-04-15 ENCOUNTER — Other Ambulatory Visit: Payer: Self-pay | Admitting: Sports Medicine

## 2023-04-15 DIAGNOSIS — F5101 Primary insomnia: Secondary | ICD-10-CM

## 2023-04-21 ENCOUNTER — Ambulatory Visit: Payer: Managed Care, Other (non HMO) | Admitting: Sports Medicine

## 2023-04-23 ENCOUNTER — Other Ambulatory Visit: Payer: Self-pay | Admitting: Sports Medicine

## 2023-04-23 DIAGNOSIS — E291 Testicular hypofunction: Secondary | ICD-10-CM

## 2023-04-29 ENCOUNTER — Encounter: Payer: Self-pay | Admitting: Sports Medicine

## 2023-04-29 ENCOUNTER — Ambulatory Visit: Payer: Managed Care, Other (non HMO) | Admitting: Sports Medicine

## 2023-05-14 ENCOUNTER — Telehealth: Payer: Self-pay | Admitting: Sports Medicine

## 2023-05-14 DIAGNOSIS — F419 Anxiety disorder, unspecified: Secondary | ICD-10-CM

## 2023-05-14 MED ORDER — VENLAFAXINE HCL ER 37.5 MG PO CP24
37.5000 mg | ORAL_CAPSULE | Freq: Every day | ORAL | 0 refills | Status: DC
Start: 2023-05-14 — End: 2023-06-17

## 2023-05-14 NOTE — Telephone Encounter (Signed)
He has had 15 days to find a new primary care provider, but since he is within the 30 days emergency care window I will refill his venlafaxine, testosterone will not be filled.

## 2023-05-14 NOTE — Telephone Encounter (Signed)
Pt called. He has been discharged from the practice.  He is requesting a refill of his antidepressant med and testosterone med.

## 2023-05-18 ENCOUNTER — Ambulatory Visit (INDEPENDENT_AMBULATORY_CARE_PROVIDER_SITE_OTHER): Payer: Managed Care, Other (non HMO) | Admitting: Internal Medicine

## 2023-05-18 ENCOUNTER — Encounter: Payer: Self-pay | Admitting: Internal Medicine

## 2023-05-18 VITALS — BP 108/68 | HR 77 | Temp 98.2°F | Ht 71.0 in | Wt 250.6 lb

## 2023-05-18 DIAGNOSIS — F909 Attention-deficit hyperactivity disorder, unspecified type: Secondary | ICD-10-CM | POA: Diagnosis not present

## 2023-05-18 DIAGNOSIS — E669 Obesity, unspecified: Secondary | ICD-10-CM | POA: Diagnosis not present

## 2023-05-18 DIAGNOSIS — E291 Testicular hypofunction: Secondary | ICD-10-CM

## 2023-05-18 DIAGNOSIS — T8131XA Disruption of external operation (surgical) wound, not elsewhere classified, initial encounter: Secondary | ICD-10-CM

## 2023-05-18 DIAGNOSIS — I10 Essential (primary) hypertension: Secondary | ICD-10-CM | POA: Diagnosis not present

## 2023-05-18 DIAGNOSIS — F411 Generalized anxiety disorder: Secondary | ICD-10-CM

## 2023-05-18 DIAGNOSIS — M87052 Idiopathic aseptic necrosis of left femur: Secondary | ICD-10-CM

## 2023-05-18 DIAGNOSIS — F410 Panic disorder [episodic paroxysmal anxiety] without agoraphobia: Secondary | ICD-10-CM

## 2023-05-18 MED ORDER — AMLODIPINE BESYLATE 2.5 MG PO TABS
2.5000 mg | ORAL_TABLET | Freq: Every day | ORAL | 2 refills | Status: DC
Start: 2023-05-18 — End: 2024-02-14

## 2023-05-18 MED ORDER — BD DISP NEEDLES 22G X 1-1/2" MISC
11 refills | Status: AC
Start: 2023-05-18 — End: ?

## 2023-05-18 MED ORDER — AMPHETAMINE-DEXTROAMPHETAMINE 10 MG PO TABS
10.0000 mg | ORAL_TABLET | Freq: Two times a day (BID) | ORAL | 0 refills | Status: DC
Start: 2023-05-18 — End: 2023-06-17

## 2023-05-18 MED ORDER — TIRZEPATIDE-WEIGHT MANAGEMENT 2.5 MG/0.5ML ~~LOC~~ SOLN
2.5000 mg | SUBCUTANEOUS | 0 refills | Status: DC
Start: 2023-05-18 — End: 2023-06-21

## 2023-05-18 MED ORDER — BUSPIRONE HCL 15 MG PO TABS
15.0000 mg | ORAL_TABLET | Freq: Two times a day (BID) | ORAL | 2 refills | Status: DC | PRN
Start: 2023-05-18 — End: 2023-06-29

## 2023-05-18 MED ORDER — TESTOSTERONE CYPIONATE 200 MG/ML IM SOLN: 200.0000 mg | INTRAMUSCULAR | 0 refills | Status: DC

## 2023-05-18 NOTE — Patient Instructions (Addendum)
SURGICAL SITE:  Keep clean and dry  Talk with ortho about discontinuing fosamax  BLOOD PRESSURE:  Take amlodipine 2.5mg  once daily  Keep log of BP , if less than 120/80, will consider stopping medication.   TESTOSTERONE:  Re-start injections    ANXIETY:  Take Venlafaxine 37.5mg  every other day for 1 month, then 1 tablet every 2 days for 2 weeks, then stop.  Buspar as needed for panic attacks - take as prescribed   WEIGHT:  Start Zepbound 2.5mg  for 4 weeks. Increase to 5mg  in 1 month.

## 2023-05-18 NOTE — Progress Notes (Signed)
[facility] [address] [place] [address] Dept: [phone] Dept Fax: [phone]  New Patient Office Visit  Subjective:   Philip Cuevas 1981-07-15 05/18/2023  Chief Complaint  Patient presents with   Establish Care    Medication refill  Weight management  ADHD    HPI: Philip Cuevas presents today to establish care at [facility] [employer]. Introduced to Publishing rights manager role and practice setting.  All questions answered.  Concerns: See below   Philip Cuevas is a 42 yo M with hx of polysubstance abuse of opiates and alcohol (clean for 1 year), HTN, anxiety, insomnia, male hypogonadism, and recent left total hip replacement due to avascular necrosis, who presents to discuss several concerns.   HYPERTENSION: PETRUS HOSHAW presents for the medical management of hypertension.  Patient's current hypertension medication regimen is: Amlodipine 5mg  Patient is  currently taking prescribed medications for HTN.  Patient is not regularly keeping a check on BP at home. Reports when he has checked it, BP has remained low 110's systolic or lower.  Denies headache, dizziness, CP, SHOB, vision changes.   BP Readings from Last 3 Encounters:  05/18/23 108/68  02/04/23 133/80  12/17/22 130/72    WEIGHT MANAGEMENT: Philip Cuevas presents for weight management.  Adhering to healthy diet: tries to Regular exercise: not currently due to recent surgery. However he has joined a gym and plans on getting back into exercise routine.   Medications tried in the past: none. Would like to try Zepbound. Goal weight is 190lbs.  Wt Readings from Last 3 Encounters:  05/18/23 250 lb 9.6 oz (113.7 kg)  02/04/23 250 lb (113.4 kg)  10/08/22 230 lb 9.6 oz (104.6 kg)   ADD/ADHD EVALUATION Philip Cuevas presents for the evaluation of ADD/ADHD.  Work/school performance:  average Difficulty sustaining  attention/completing tasks: yes Distracted by extraneous stimuli: yes Fidgets with hands or feet: yes  Previous psychiatry evaluation: yes - dx with ADHD. Previous medications: no  No hx of amphetamine abuse.   HYPOGONADISM:  Hx of low testosterone. Takes Testosterone injections 200mg  q14 days.  He does need refill.   ANXIETY: Philip Cuevas presents for the medical management of anxiety.  Current medication regimen: propranolol (just took primarily for tremors related to ETOH withdrawal, which he is no longer experiencing), Effexor XR 37.5mg  (recently decreased dose from 75mg  to 37.5mg  3 months ago, doing very well, would like to try to come off medication as he feels anxiety was related to drug and alcohol abuse), seroquel 25mg  for sleep  Counseling: yes. He does NA meetings, and leads his own NA meetings.  Well controlled: yes Denies SI/HI.     05/18/2023    9:11 AM 12/30/2022   10:09 AM 10/08/2022   10:06 AM 06/17/2021    5:09 PM  GAD 7 : Generalized Anxiety Score  Nervous, Anxious, on Edge 1 1 1 2   Control/stop worrying 0 1 2 2   Worry too much - different things 1 1 1 2   Trouble relaxing 1 1 1 2   Restless 0 0 0 2  Easily annoyed or irritable 1 0 1 2  Afraid - awful might happen 0 0 1 2  Total GAD 7 Score 4 4 7 14   Anxiety Difficulty Somewhat difficult Not difficult at all Not difficult at all Somewhat difficult      05/18/2023    9:11 AM 02/04/2023    3:28 PM 12/30/2022  10:06 AM  PHQ9 SCORE ONLY  PHQ-9 Total Score 0 0 2     SURGICAL SITE of LEFT HIP:  Patient underwent total left hip replacement for avascular necrosis 1-2 months ago. Is doing well and is starting PT. He does have wound to surgical incision he is concerned about. He discussed this with his ortho surgery team, who said to keep area dry and open to air. He would like PCP opinion. Denies active drainage or redness from site.    The following portions of the patient's history were reviewed and updated  as appropriate: past medical history, past surgical history, family history, social history, allergies, medications, and problem list.   Patient Active Problem List   Diagnosis Date Noted   Obesity (BMI 30-39.9) 05/18/2023   Suspicious nevus 02/04/2023   Avascular necrosis of hip, 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   Numbness and tingling of right leg 12/17/2022   Fracture, foot, right, sequela 12/17/2022   Alcohol use disorder 07/14/2022   Narcotic withdrawal (HCC) 07/12/2022   Tachycardia 07/11/2022   Polysubstance abuse (HCC) 07/10/2022   Alcohol withdrawal delirium (HCC) 07/07/2022   Alcohol withdrawal (HCC) 07/06/2022   Poor social situation 07/06/2022   Opioid use disorder 07/06/2022   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   History of migraine headaches 08/29/2018   Venous stasis dermatitis of both lower extremities 12/22/2017   Morbid obesity (HCC) 04/30/2017   Obstructive sleep apnea 04/30/2017   Hypertriglyceridemia 03/03/2017   Chronic epigastric pain 03/02/2017   Left knee pain with intra-articular loose body 05/02/2012   Essential (primary) hypertension 05/07/2008   Erectile dysfunction 07/06/2007   MDD (major depressive disorder), severe (HCC) 11/24/2006   ASTHMA, EXERCISE INDUCED 11/24/2006   Insomnia 06/04/2006   Tobacco use disorder 06/04/2006   GAD 06/01/2006   Past Medical History:  Diagnosis Date   Anxiety    Asthma    mild, intermittent   Depression    Hypertension    Knee injury    Tobacco use disorder 06/04/2006   Qualifier: Diagnosis of   By: Thomos Lemons       Past Surgical History:  Procedure Laterality Date   DENTAL SURGERY     PILONIDAL CYST EXCISION N/A 04/12/2015   Procedure: CYST EXCISION PILONIDAL SIMPLE;  Surgeon: Franky Macho, MD;  Location: [facility];  Service: General;  Laterality: N/A;   TONSILECTOMY, ADENOIDECTOMY, BILATERAL MYRINGOTOMY AND TUBES     Family History  Problem Relation Age of Onset   Hypertension Mother    Hyperlipidemia Mother     Depression Father        committed suicide    Current Outpatient Medications:    alendronate (FOSAMAX) 70 MG tablet, Take 1 tablet (70 mg total) by mouth every 7 (seven) days., Disp: 4 tablet, Rfl: 11   amLODipine (NORVASC) 2.5 MG tablet, Take 1 tablet (2.5 mg total) by mouth daily., Disp: 30 tablet, Rfl: 2   amphetamine-dextroamphetamine (ADDERALL) 10 MG tablet, Take 1 tablet (10 mg total) by mouth 2 (two) times daily., Disp: 60 tablet, Rfl: 0   busPIRone (BUSPAR) 15 MG tablet, Take 1 tablet (15 mg total) by mouth 2 (two) times daily as needed (panic attacks)., Disp: 60 tablet, Rfl: 2   ibuprofen (ADVIL) 800 MG tablet, Take 800 mg by mouth 3 (three) times daily as needed., Disp: , Rfl:    QUEtiapine (SEROQUEL) 25 MG tablet, TAKE 1 TABLET BY MOUTH EVERY NIGHT. MAY INCREASE TO 2 TABLETS BY MOUTH EVERY NIGHT AFTER A WEEK IF INSUFFICIENT EFFICACY,  Disp: 30 tablet, Rfl: 3   SYRINGE-NEEDLE, DISP, 3 ML (B-D 3CC LUER-LOK SYR 18GX1-1/2) 18G X 1-1/2" 3 ML MISC, USE EVERY 2 WEEKS FOR TESTOSTERONE INJECTION, Disp: 50 each, Rfl: 11   tadalafil (CIALIS) 20 MG tablet, Take 1 tablet (20 mg total) by mouth every three (3) days as needed., Disp: 6 tablet, Rfl: 11   tirzepatide (ZEPBOUND) 2.5 MG/0.5ML injection vial, Inject 2.5 mg into the skin once a week., Disp: 2 mL, Rfl: 0   venlafaxine XR (EFFEXOR XR) 37.5 MG 24 hr capsule, Take 1 capsule (37.5 mg total) by mouth daily with breakfast., Disp: 30 capsule, Rfl: 0   meloxicam (MOBIC) 15 MG tablet, One tab PO every 24 hours with a meal for 2 weeks, then once every 24 hours prn pain. (Patient not taking: Reported on 05/18/2023), Disp: 30 tablet, Rfl: 3   NEEDLE, DISP, 22 G (BD DISP NEEDLES) 22G X 1-1/2" MISC, USE EVERY 2 WEEKS FOR TESTOSTERONE INJECTIONS, Disp: 50 each, Rfl: 11   testosterone cypionate (DEPOTESTOSTERONE CYPIONATE) 200 MG/ML injection, Inject 1 mL (200 mg total) into the muscle every 14 (fourteen) days., Disp: 10 mL, Rfl: 0 No Known Allergies  ROS: A  complete ROS was performed with pertinent positives/negatives noted in the HPI. The remainder of the ROS are negative.   Objective:   Today's Vitals   05/18/23 0907  BP: 108/68  Pulse: 77  Temp: 98.2 F (36.8 C)  TempSrc: Temporal  SpO2: 98%  Weight: 250 lb 9.6 oz (113.7 kg)  Height: 5\' 11"  (1.803 m)    GENERAL: Well-appearing, in NAD. Well nourished.  SKIN: Pink, warm and dry. 1cm partial thickness wound to left hip at surgical incision with granulation tissue present NECK: Trachea midline. Full ROM w/o pain or tenderness. No lymphadenopathy.  RESPIRATORY: Chest wall symmetrical. Respirations even and non-labored. Breath sounds clear to auscultation bilaterally.  CARDIAC: S1, S2 present, regular rate and rhythm. Peripheral pulses 2+ bilaterally.  MSK: Muscle tone and strength appropriate for age.  EXTREMITIES: Without clubbing, cyanosis, or edema.  NEUROLOGIC:  Steady, even gait.  PSYCH/MENTAL STATUS: Alert, oriented x 3. Cooperative, appropriate mood and affect. Normal rate and non-pressured speech.Thought process is coherent. Makes eye contact.       Component Ref Range & Units 3 mo ago 1 yr ago  Testosterone, Total, LC-MS-MS 250 - 1,100 ng/dL 638 75 Low  CM  Comment: . For additional information, please refer to https://education.questdiagnostics.com/faq/FAQ165 (This link is being provided for informational/educational purposes only.) (Note) . This test was developed and its analytical performance characteristics have been determined by [facility]. It has not been cleared or approved by the FDA. This assay has been validated pursuant to the CLIA regulations and is used for clinical purposes. . Tandy Gaw Testosterone 35.0 - 155.0 pg/mL 220.4 High  13.3 Low  CM  Comment: (Note) This test was developed and its analytical performance characteristics have been determined by [facility]. It has not been cleared or approved by the FDA. This assay has been validated pursuant  to the CLIA regulations and is used for clinical purposes. . [facility] [address] [place] [address] [phone] Junita Push L. Thompson Caul, MD, PhD  Resulting Agency [facility]         Resulting Agency's Comment  Performing Organization Information:     Site ID[facility] Caprice (CLIA: [id number])     Name: [facility]     Address: [address] [place], [place] [address]  Director: Eliezer Lofts MD    Specimen Collected: 02/04/23 15:46 Last Resulted: 02/08/23 14:58      There are no preventive care reminders to display for this patient.  No results found for any visits on 05/18/23.  Assessment & Plan:  1. Essential (primary) hypertension - amLODipine (NORVASC) 2.5 MG tablet; Take 1 tablet (2.5 mg total) by mouth daily.  Dispense: 30 tablet; Refill: 2 - patient will continue to check BP at home, if remains < 120/80 on 2.5mg  of amlodipine, will then d/c at next visit.   2. Obesity (BMI 30-39.9) - discussed healthy diet and regular exercise  - tirzepatide (ZEPBOUND) 2.5 MG/0.5ML injection vial; Inject 2.5 mg into the skin once a week.  Dispense: 2 mL; Refill: 0  3. Attention deficit hyperactivity disorder (ADHD), unspecified ADHD type - Urine drugs of abuse scrn w alc, routine (Ref Lab) - amphetamine-dextroamphetamine (ADDERALL) 10 MG tablet; Take 1 tablet (10 mg total) by mouth 2 (two) times daily.  Dispense: 60 tablet; Refill: 0 - controlled substance contract signed   4. Male hypogonadism - testosterone cypionate (DEPOTESTOSTERONE CYPIONATE) 200 MG/ML injection; Inject 1 mL (200 mg total) into the muscle every 14 (fourteen) days.  Dispense: 10 mL; Refill: 0 - NEEDLE, DISP, 22 G (BD DISP NEEDLES) 22G X 1-1/2" MISC; USE EVERY 2 WEEKS FOR TESTOSTERONE INJECTIONS  Dispense: 50 each; Refill: 11 - will check testosterone level at next follow up appointment  5. Generalized anxiety disorder with panic attacks - busPIRone (BUSPAR) 15  MG tablet; Take 1 tablet (15 mg total) by mouth 2 (two) times daily as needed (panic attacks).  Dispense: 60 tablet; Refill: 2 - Patient desires to stop effexor. Decrease effexor to 37.5mg  every other day for 1 month, then can do every 2 days for 2 weeks then stop. We can re-start medication if patient desires.   6. Avascular necrosis of hip, left (HCC) - continue follow up with ortho and PT   7. Postoperative dehiscence of skin wound, initial encounter - healing wound with good granulation tissue present  - agree with ortho, keep wound open to air and dry    Orders Placed This Encounter  Procedures   Urine drugs of abuse scrn w alc, routine (Ref Lab)     Current Outpatient Medications:    alendronate (FOSAMAX) 70 MG tablet, Take 1 tablet (70 mg total) by mouth every 7 (seven) days., Disp: 4 tablet, Rfl: 11   ibuprofen (ADVIL) 800 MG tablet, Take 800 mg by mouth 3 (three) times daily as needed., Disp: , Rfl:    NEEDLE, DISP, 22 G (BD DISP NEEDLES) 22G X 1-1/2" MISC, USE EVERY 2 WEEKS FOR TESTOSTERONE INJECTIONS, Disp: 50 each, Rfl: 11   propranolol (INDERAL) 10 MG tablet, Take 1 tablet (10 mg total) by mouth daily as needed., Disp: 30 tablet, Rfl: 3   QUEtiapine (SEROQUEL) 25 MG tablet, TAKE 1 TABLET BY MOUTH EVERY NIGHT. MAY INCREASE TO 2 TABLETS BY MOUTH EVERY NIGHT AFTER A WEEK IF INSUFFICIENT EFFICACY, Disp: 30 tablet, Rfl: 3   SYRINGE-NEEDLE, DISP, 3 ML (B-D 3CC LUER-LOK SYR 18GX1-1/2) 18G X 1-1/2" 3 ML MISC, USE EVERY 2 WEEKS FOR TESTOSTERONE INJECTION, Disp: 50 each, Rfl: 11   tadalafil (CIALIS) 20 MG tablet, Take 1 tablet (20 mg total) by mouth every three (3) days as needed., Disp: 6 tablet, Rfl: 11   testosterone cypionate (DEPOTESTOSTERONE CYPIONATE) 200 MG/ML injection, ADMINISTER 1 ML(200 MG) IN THE MUSCLE EVERY 14 DAYS, Disp: 10 mL, Rfl: 3  venlafaxine XR (EFFEXOR XR) 37.5 MG 24 hr capsule, Take 1 capsule (37.5 mg total) by mouth daily with breakfast., Disp: 30 capsule, Rfl: 0    amLODipine (NORVASC) 5 MG tablet, Take 1 tablet (5 mg total) by mouth daily., Disp: 90 tablet, Rfl: 3   meloxicam (MOBIC) 15 MG tablet, One tab PO every 24 hours with a meal for 2 weeks, then once every 24 hours prn pain. (Patient not taking: Reported on 05/18/2023), Disp: 30 tablet, Rfl: 3   Meds ordered this encounter  Medications   tirzepatide (ZEPBOUND) 2.5 MG/0.5ML injection vial    Sig: Inject 2.5 mg into the skin once a week.    Dispense:  2 mL    Refill:  0    Order Specific Question:   Supervising Provider    Answer:   Garnette Gunner [[id number]]   amLODipine (NORVASC) 2.5 MG tablet    Sig: Take 1 tablet (2.5 mg total) by mouth daily.    Dispense:  30 tablet    Refill:  2    Order Specific Question:   Supervising Provider    Answer:   Garnette Gunner [[id number]]   testosterone cypionate (DEPOTESTOSTERONE CYPIONATE) 200 MG/ML injection    Sig: Inject 1 mL (200 mg total) into the muscle every 14 (fourteen) days.    Dispense:  10 mL    Refill:  0    Order Specific Question:   Supervising Provider    Answer:   Garnette Gunner [[id number]]   NEEDLE, DISP, 22 G (BD DISP NEEDLES) 22G X 1-1/2" MISC    Sig: USE EVERY 2 WEEKS FOR TESTOSTERONE INJECTIONS    Dispense:  50 each    Refill:  11    Order Specific Question:   Supervising Provider    Answer:   Garnette Gunner [[id number]]   amphetamine-dextroamphetamine (ADDERALL) 10 MG tablet    Sig: Take 1 tablet (10 mg total) by mouth 2 (two) times daily.    Dispense:  60 tablet    Refill:  0    Order Specific Question:   Supervising Provider    Answer:   Garnette Gunner [[id number]]   busPIRone (BUSPAR) 15 MG tablet    Sig: Take 1 tablet (15 mg total) by mouth 2 (two) times daily as needed (panic attacks).    Dispense:  60 tablet    Refill:  2    Order Specific Question:   Supervising Provider    Answer:   Garnette Gunner [[id number]]    Return in about 6 weeks (around 06/29/2023) for ADHD, anxiety, hip, testosterone, and BP -  fasting labs at visit.   Salvatore Decent, FNP

## 2023-05-20 ENCOUNTER — Telehealth: Payer: Self-pay

## 2023-05-20 LAB — URINE DRUGS OF ABUSE SCREEN W ALC, ROUTINE (REF LAB)
Amphetamines, Urine: NEGATIVE ng/mL
Barbiturate Quant, Ur: NEGATIVE ng/mL
Benzodiazepine Quant, Ur: NEGATIVE ng/mL
Cannabinoid Quant, Ur: NEGATIVE ng/mL
Cocaine (Metab.): NEGATIVE ng/mL
Ethanol, Urine: NEGATIVE %
Methadone Screen, Urine: NEGATIVE ng/mL
Opiate Quant, Ur: NEGATIVE ng/mL
PCP Quant, Ur: NEGATIVE ng/mL
Propoxyphene: NEGATIVE ng/mL

## 2023-05-20 NOTE — Telephone Encounter (Signed)
Pharmacy requesting PA tirzepatide (ZEPBOUND) 2.5 MG/0.5ML injection vial

## 2023-05-21 ENCOUNTER — Telehealth: Payer: Self-pay

## 2023-05-21 ENCOUNTER — Encounter: Payer: Self-pay | Admitting: Internal Medicine

## 2023-05-21 ENCOUNTER — Other Ambulatory Visit (HOSPITAL_COMMUNITY): Payer: Self-pay

## 2023-05-21 NOTE — Telephone Encounter (Signed)
Pharmacy Patient Advocate Encounter   Received notification from Pt Calls Messages that prior authorization for Zepbound 2.5mg /0.73ml is required/requested.   Insurance verification completed.   The patient is insured through [facility] .   Per test claim: PA required; PA submitted to [facility] via CoverMyMeds Key/confirmation #/EOC [facility] Status is pending

## 2023-05-24 ENCOUNTER — Other Ambulatory Visit (HOSPITAL_COMMUNITY): Payer: Self-pay

## 2023-05-24 NOTE — Telephone Encounter (Signed)
Pharmacy Patient Advocate Encounter  Received notification from [facility] that Prior Authorization for Zepbound 2.5mg /0.17ml has been APPROVED from 05/21/23 to 01/16/24   PA #/Case ID/Reference #:  [id number]  Approval letter indexed to media tab  Left a voicemail with [facility] to notify of the approval

## 2023-06-03 ENCOUNTER — Other Ambulatory Visit: Payer: Self-pay

## 2023-06-03 ENCOUNTER — Encounter: Payer: Self-pay | Admitting: Internal Medicine

## 2023-06-03 DIAGNOSIS — E291 Testicular hypofunction: Secondary | ICD-10-CM

## 2023-06-03 NOTE — Telephone Encounter (Signed)
Rx request sent to another provider

## 2023-06-03 NOTE — Telephone Encounter (Signed)
Patient requesting Rx refill  Last Ov 05/18/23 Filled 05/18/23

## 2023-06-07 ENCOUNTER — Telehealth: Payer: Managed Care, Other (non HMO)

## 2023-06-07 NOTE — Telephone Encounter (Signed)
Pt is wanting a cb, he feels a mistake has been made with his dosage and him being able to get a refill. He has been out for three weeks and needs a refill of this. Please advise pt at [phone]  testosterone cypionate (DEPOTESTOSTERONE CYPIONATE) 200 MG/ML injection [[id number]]

## 2023-06-08 ENCOUNTER — Encounter: Payer: Self-pay | Admitting: Family Medicine

## 2023-06-08 ENCOUNTER — Ambulatory Visit (INDEPENDENT_AMBULATORY_CARE_PROVIDER_SITE_OTHER): Payer: Managed Care, Other (non HMO) | Admitting: Family Medicine

## 2023-06-08 VITALS — BP 128/84 | HR 76 | Temp 97.6°F | Wt 237.4 lb

## 2023-06-08 DIAGNOSIS — F322 Major depressive disorder, single episode, severe without psychotic features: Secondary | ICD-10-CM

## 2023-06-08 DIAGNOSIS — F524 Premature ejaculation: Secondary | ICD-10-CM

## 2023-06-08 DIAGNOSIS — E291 Testicular hypofunction: Secondary | ICD-10-CM | POA: Diagnosis not present

## 2023-06-08 DIAGNOSIS — F1910 Other psychoactive substance abuse, uncomplicated: Secondary | ICD-10-CM

## 2023-06-08 DIAGNOSIS — F419 Anxiety disorder, unspecified: Secondary | ICD-10-CM

## 2023-06-08 DIAGNOSIS — G8929 Other chronic pain: Secondary | ICD-10-CM

## 2023-06-08 DIAGNOSIS — M25552 Pain in left hip: Secondary | ICD-10-CM

## 2023-06-08 MED ORDER — EASY GLIDE LUER LOCK SYRINGE 1 ML MISC: 1.0000 | 0 refills | Status: AC | PRN

## 2023-06-08 MED ORDER — TESTOSTERONE CYPIONATE 100 MG/ML IM SOLN
100.0000 mg | INTRAMUSCULAR | 0 refills | Status: DC
Start: 2023-06-08 — End: 2023-06-16

## 2023-06-08 NOTE — Progress Notes (Unsigned)
Assessment/Plan:   Problem List Items Addressed This Visit   None   There are no discontinued medications.  No follow-ups on file.    Subjective:   Encounter date: 06/08/2023  Philip Cuevas is a 42 y.o. male who has GAD; Erectile dysfunction; MDD (major depressive disorder), severe (HCC); Essential (primary) hypertension; ASTHMA, EXERCISE INDUCED; Insomnia; Tobacco use disorder; Left knee pain with intra-articular loose body; Chronic epigastric pain; Hypertriglyceridemia; Morbid obesity (HCC); Obstructive sleep apnea; Venous stasis dermatitis of both lower extremities; History of migraine headaches; Male hypogonadism; Alcohol withdrawal (HCC); Poor social situation; Opioid use disorder; Alcohol withdrawal delirium (HCC); Polysubstance abuse (HCC); Tachycardia; Narcotic withdrawal (HCC); Alcohol use disorder; Avascular necrosis of hip, left (HCC); Numbness and tingling of right leg; Fracture, foot, right, sequela; Suspicious nevus; and Obesity (BMI 30-39.9) on their problem list..   He  has a past medical history of Anxiety, Asthma, Depression, Hypertension, Knee injury, and Tobacco use disorder (06/04/2006).Marland Kitchen   He presents with chief complaint of Medical Management of Chronic Issues (needs new testosterone script to say 25ml-1.5ml. Was unable to pick up rx from pharmacy. Rx refill meloxicam) .   HPI:   ROS  Past Surgical History:  Procedure Laterality Date   DENTAL SURGERY     PILONIDAL CYST EXCISION N/A 04/12/2015   Procedure: CYST EXCISION PILONIDAL SIMPLE;  Surgeon: Franky Macho, MD;  Location: [facility];  Service: General;  Laterality: N/A;   TONSILECTOMY, ADENOIDECTOMY, BILATERAL MYRINGOTOMY AND TUBES      Outpatient Medications Prior to Visit  Medication Sig Dispense Refill   amLODipine (NORVASC) 2.5 MG tablet Take 1 tablet (2.5 mg total) by mouth daily. 30 tablet 2   amphetamine-dextroamphetamine (ADDERALL) 10 MG tablet Take 1 tablet (10 mg total) by mouth 2 (two)  times daily. 60 tablet 0   busPIRone (BUSPAR) 15 MG tablet Take 1 tablet (15 mg total) by mouth 2 (two) times daily as needed (panic attacks). 60 tablet 2   meloxicam (MOBIC) 15 MG tablet One tab PO every 24 hours with a meal for 2 weeks, then once every 24 hours prn pain. 30 tablet 3   NEEDLE, DISP, 22 G (BD DISP NEEDLES) 22G X 1-1/2" MISC USE EVERY 2 WEEKS FOR TESTOSTERONE INJECTIONS 50 each 11   QUEtiapine (SEROQUEL) 25 MG tablet TAKE 1 TABLET BY MOUTH EVERY NIGHT. MAY INCREASE TO 2 TABLETS BY MOUTH EVERY NIGHT AFTER A WEEK IF INSUFFICIENT EFFICACY 30 tablet 3   SYRINGE-NEEDLE, DISP, 3 ML (B-D 3CC LUER-LOK SYR 18GX1-1/2) 18G X 1-1/2" 3 ML MISC USE EVERY 2 WEEKS FOR TESTOSTERONE INJECTION 50 each 11   tadalafil (CIALIS) 20 MG tablet Take 1 tablet (20 mg total) by mouth every three (3) days as needed. 6 tablet 11   testosterone cypionate (DEPOTESTOSTERONE CYPIONATE) 200 MG/ML injection Inject 1 mL (200 mg total) into the muscle every 14 (fourteen) days. 10 mL 0   tirzepatide (ZEPBOUND) 2.5 MG/0.5ML injection vial Inject 2.5 mg into the skin once a week. 2 mL 0   venlafaxine XR (EFFEXOR XR) 37.5 MG 24 hr capsule Take 1 capsule (37.5 mg total) by mouth daily with breakfast. 30 capsule 0   alendronate (FOSAMAX) 70 MG tablet Take 1 tablet (70 mg total) by mouth every 7 (seven) days. (Patient not taking: Reported on 06/08/2023) 4 tablet 11   ibuprofen (ADVIL) 800 MG tablet Take 800 mg by mouth 3 (three) times daily as needed. (Patient not taking: Reported on 06/08/2023)     No facility-administered medications  prior to visit.    Family History  Problem Relation Age of Onset   Hypertension Mother    Hyperlipidemia Mother    Depression Father        committed suicide    Social History   Socioeconomic History   Marital status: Married    Spouse name: Not on file   Number of children: Not on file   Years of education: Not on file   Highest education level: Not on file  Occupational History    Not on file  Tobacco Use   Smoking status: Former    Current packs/day: 0.00    Average packs/day: 0.3 packs/day for 4.0 years (1.2 ttl pk-yrs)    Types: E-cigarettes, Cigarettes    Start date: 10/22/2001    Quit date: 10/22/2005    Years since quitting: 17.6   Smokeless tobacco: Never  Substance and Sexual Activity   Alcohol use: Yes    Comment: 2 drinks a day   Drug use: Yes    Comment: snorts heroin   Sexual activity: Not on file    Comment: 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, 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, 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  Other Topics Concern   Not on file  Social History Narrative   Not on file   Social Determinants of Health   Financial Resource Strain: Not on file  Food Insecurity: Not on file  Transportation Needs: Not on file  Physical Activity: Not on file  Stress: Not on file  Social Connections: Not on file  Intimate Partner Violence: Not on file                                                                                                  Objective:  Physical Exam: BP 128/84 (BP Location: Left Arm, Patient Position: Sitting, Cuff Size: Large)   Pulse 76   Temp 97.6 F (36.4 C) (Temporal)   Wt 237 lb 6.4 oz (107.7 kg)   SpO2 98%   BMI 33.11 kg/m     Physical Exam  No results found.  Recent Results (from the past 2160 hour(s))  Urine drugs of abuse scrn w alc, routine (Ref Lab)     Status: None   Collection Time: 05/18/23 10:13 AM  Result Value Ref Range   Amphetamines, Urine Negative Cutoff=1000 ng/mL    Comment: Amphetamine test includes Amphetamine and Methamphetamine.   Barbiturate Quant, Ur Negative Cutoff=300 ng/mL   Benzodiazepine Quant, Ur Negative Cutoff=300 ng/mL   Cannabinoid Quant, Ur Negative Cutoff=50 ng/mL   Cocaine (Metab.) Negative Cutoff=300 ng/mL   Opiate Quant, Ur Negative Cutoff=300 ng/mL    Comment: Opiate test includes Codeine and Morphine only.   PCP Quant, Ur Negative Cutoff=25 ng/mL   Methadone Screen, Urine  Negative Cutoff=300 ng/mL   Propoxyphene Negative Cutoff=300 ng/mL   Ethanol, Urine Negative Cutoff=0.020 %        Garner Nash, MD, MS

## 2023-06-09 ENCOUNTER — Telehealth: Payer: Self-pay | Admitting: Internal Medicine

## 2023-06-09 MED ORDER — MELOXICAM 15 MG PO TABS
ORAL_TABLET | ORAL | 3 refills | Status: DC
Start: 2023-06-09 — End: 2023-09-29

## 2023-06-09 NOTE — Assessment & Plan Note (Signed)
Complex interaction between mental health, hypogonadism, polysubstance abuse history and behavioral health Recommend follow-up with primary care to discuss further Consider referral to urology for further assessment

## 2023-06-09 NOTE — Assessment & Plan Note (Signed)
The patient's serum testosterone levels were checked in June and found to be mildly elevated. The current dose of 200 mg/mL every 2 weeks. Concern for supratherapeutic dosing and potential adverse side effects including hypercoagulability cardiac disease.  Possibly due to patient inadvertently over drawling.  Amounts greater than 1 mL using a syringe is 3 mL. This may have led to the prescription.  Running out before scheduled follow-up. Discussed this with patient. Decreased dose to 100 mg (1 mL) with testosterone cypionate 100 mg/mL week  Prescribed 1 mL syringe to prevent over drawing A re-check of serum testosterone levels is planned in 1 month with patient's PCP

## 2023-06-09 NOTE — Telephone Encounter (Signed)
[facility] pharmacy called about the patient testosterone  prescription . Please give them a call @ [phone]

## 2023-06-09 NOTE — Assessment & Plan Note (Signed)
Patient with complex mental health history from  [place] discussed consistent PRN use of Buspirone  Recommend following up with PCP versus psychiatry to discuss medication adjustment and forward.

## 2023-06-10 ENCOUNTER — Encounter: Payer: Self-pay | Admitting: Family Medicine

## 2023-06-16 ENCOUNTER — Other Ambulatory Visit: Payer: Self-pay | Admitting: Family

## 2023-06-16 DIAGNOSIS — E291 Testicular hypofunction: Secondary | ICD-10-CM

## 2023-06-16 MED ORDER — TESTOSTERONE CYPIONATE 100 MG/ML IM SOLN
100.0000 mg | INTRAMUSCULAR | 0 refills | Status: DC
Start: 2023-06-16 — End: 2023-07-01

## 2023-06-16 NOTE — Telephone Encounter (Signed)
Spoke to pharmacy, since he declined to pick it up they closed out the RX.  So we need to resend the 100 mg Testosterone to the pharmacy.   Please review and advise.  Thanks. Dm/cma

## 2023-06-17 ENCOUNTER — Other Ambulatory Visit: Payer: Self-pay | Admitting: Internal Medicine

## 2023-06-17 DIAGNOSIS — F909 Attention-deficit hyperactivity disorder, unspecified type: Secondary | ICD-10-CM

## 2023-06-17 DIAGNOSIS — F419 Anxiety disorder, unspecified: Secondary | ICD-10-CM

## 2023-06-17 MED ORDER — AMPHETAMINE-DEXTROAMPHETAMINE 10 MG PO TABS
10.0000 mg | ORAL_TABLET | Freq: Two times a day (BID) | ORAL | 0 refills | Status: DC
Start: 2023-06-17 — End: 2023-06-29

## 2023-06-17 MED ORDER — VENLAFAXINE HCL ER 37.5 MG PO CP24
37.5000 mg | ORAL_CAPSULE | Freq: Every day | ORAL | 0 refills | Status: DC
Start: 2023-06-17 — End: 2023-07-20

## 2023-06-21 ENCOUNTER — Encounter: Payer: Self-pay | Admitting: Internal Medicine

## 2023-06-21 ENCOUNTER — Other Ambulatory Visit: Payer: Self-pay | Admitting: Orthopaedic Surgery

## 2023-06-21 ENCOUNTER — Other Ambulatory Visit: Payer: Self-pay | Admitting: Internal Medicine

## 2023-06-21 DIAGNOSIS — E669 Obesity, unspecified: Secondary | ICD-10-CM

## 2023-06-21 DIAGNOSIS — M545 Low back pain, unspecified: Secondary | ICD-10-CM

## 2023-06-21 MED ORDER — TIRZEPATIDE 5 MG/0.5ML ~~LOC~~ SOAJ: 5.0000 mg | SUBCUTANEOUS | 0 refills | Status: DC

## 2023-06-21 NOTE — Telephone Encounter (Signed)
Rx sent to pharmacy   

## 2023-06-22 ENCOUNTER — Other Ambulatory Visit (HOSPITAL_COMMUNITY): Payer: Self-pay

## 2023-06-22 ENCOUNTER — Other Ambulatory Visit: Payer: Self-pay | Admitting: Internal Medicine

## 2023-06-22 ENCOUNTER — Telehealth: Payer: Self-pay

## 2023-06-22 DIAGNOSIS — E669 Obesity, unspecified: Secondary | ICD-10-CM

## 2023-06-22 MED ORDER — TIRZEPATIDE-WEIGHT MANAGEMENT 5 MG/0.5ML ~~LOC~~ SOAJ: 5.0000 mg | SUBCUTANEOUS | 0 refills | Status: DC

## 2023-06-22 NOTE — Telephone Encounter (Signed)
Error

## 2023-06-28 NOTE — Progress Notes (Unsigned)
[facility] [address] [place] [address] Dept: [phone] Dept Fax: [phone]    Subjective:   Philip Cuevas [place] [birth year] 06/29/2023  No chief complaint on file.   HPI: Discussed the use of AI scribe software for clinical note transcription with the patient, who gave verbal consent to proceed.  History of Present Illness            The following portions of the patient's history were reviewed and updated as appropriate: past medical history, past surgical history, family history, social history, allergies, medications, and problem list.   Patient Active Problem List   Diagnosis Date Noted   Premature ejaculation 06/08/2023   Obesity (BMI 30-39.9) 05/18/2023   Suspicious nevus 02/04/2023   Avascular necrosis of hip, 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   Numbness and tingling of right leg 12/17/2022   Fracture, foot, right, sequela 12/17/2022   Alcohol use disorder 07/14/2022   Narcotic withdrawal (HCC) 07/12/2022   Tachycardia 07/11/2022   Polysubstance abuse (HCC) 07/10/2022   Alcohol withdrawal delirium (HCC) 07/07/2022   Alcohol withdrawal (HCC) 07/06/2022   Poor social situation 07/06/2022   Opioid use disorder 07/06/2022   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   History of migraine headaches 08/29/2018   Venous stasis dermatitis of both lower extremities 12/22/2017   Morbid obesity (HCC) 04/30/2017   Obstructive sleep apnea 04/30/2017   Hypertriglyceridemia 03/03/2017   Chronic epigastric pain 03/02/2017   Left knee pain with intra-articular loose body 05/02/2012   Essential (primary) hypertension 05/07/2008   Erectile dysfunction 07/06/2007   MDD (major depressive disorder), severe (HCC) 11/24/2006   ASTHMA, EXERCISE INDUCED 11/24/2006   Insomnia 06/04/2006   Tobacco use disorder 06/04/2006   GAD 06/01/2006   Past Medical History:  Diagnosis Date   Anxiety    Asthma    mild, intermittent   Depression     Hypertension    Knee injury    Tobacco use disorder 06/04/2006   Qualifier: Diagnosis of   By: Thomos Lemons       Past Surgical History:  Procedure Laterality Date   DENTAL SURGERY     PILONIDAL CYST EXCISION N/A 04/12/2015   Procedure: CYST EXCISION PILONIDAL SIMPLE;  Surgeon: Franky Macho, MD;  Location: [facility];  Service: General;  Laterality: N/A;   TONSILECTOMY, ADENOIDECTOMY, BILATERAL MYRINGOTOMY AND TUBES     Family History  Problem Relation Age of Onset   Hypertension Mother    Hyperlipidemia Mother    Depression Father        committed suicide    Current Outpatient Medications:    alendronate (FOSAMAX) 70 MG tablet, Take 1 tablet (70 mg total) by mouth every 7 (seven) days. (Patient not taking: Reported on 06/08/2023), Disp: 4 tablet, Rfl: 11   amLODipine (NORVASC) 2.5 MG tablet, Take 1 tablet (2.5 mg total) by mouth daily., Disp: 30 tablet, Rfl: 2   amphetamine-dextroamphetamine (ADDERALL) 10 MG tablet, Take 1 tablet (10 mg total) by mouth 2 (two) times daily., Disp: 60 tablet, Rfl: 0   busPIRone (BUSPAR) 15 MG tablet, Take 1 tablet (15 mg total) by mouth 2 (two) times daily as needed (panic attacks)., Disp: 60 tablet, Rfl: 2   meloxicam (MOBIC) 15 MG tablet, One tab PO every 24 hours with a meal for 2 weeks, then once every 24 hours prn pain., Disp: 30 tablet, Rfl: 3   NEEDLE, DISP, 22 G (BD DISP NEEDLES) 22G X 1-1/2" MISC, USE EVERY 2 WEEKS FOR TESTOSTERONE INJECTIONS, Disp:  50 each, Rfl: 11   QUEtiapine (SEROQUEL) 25 MG tablet, TAKE 1 TABLET BY MOUTH EVERY NIGHT. MAY INCREASE TO 2 TABLETS BY MOUTH EVERY NIGHT AFTER A WEEK IF INSUFFICIENT EFFICACY, Disp: 30 tablet, Rfl: 3   Syringe, Disposable, (EASY GLIDE LUER LOCK SYRINGE) 1 ML MISC, 1 each by Does not apply route as needed., Disp: 25 each, Rfl: 0   tadalafil (CIALIS) 20 MG tablet, Take 1 tablet (20 mg total) by mouth every three (3) days as needed., Disp: 6 tablet, Rfl: 11   testosterone cypionate (DEPOTESTOTERONE  CYPIONATE) 100 MG/ML injection, Inject 1 mL (100 mg total) into the muscle every 14 (fourteen) days. For IM use only, Disp: 10 mL, Rfl: 0   tirzepatide (ZEPBOUND) 5 MG/0.5ML Pen, Inject 5 mg into the skin once a week., Disp: 6 mL, Rfl: 0   venlafaxine XR (EFFEXOR XR) 37.5 MG 24 hr capsule, Take 1 capsule (37.5 mg total) by mouth daily with breakfast., Disp: 30 capsule, Rfl: 0 No Known Allergies   ROS: A complete ROS was performed with pertinent positives/negatives noted in the HPI. The remainder of the ROS are negative.    Objective:   There were no vitals filed for this visit.  GENERAL: Well-appearing, in NAD. Well nourished.  SKIN: Pink, warm and dry. No rash, lesion, ulceration, or ecchymoses.  NECK: Trachea midline. Full ROM w/o pain or tenderness. No lymphadenopathy.  RESPIRATORY: Chest wall symmetrical. Respirations even and non-labored. Breath sounds clear to auscultation bilaterally.  CARDIAC: S1, S2 present, regular rate and rhythm. Peripheral pulses 2+ bilaterally.  MSK: Muscle tone and strength appropriate for age. Joints w/o tenderness, redness, or swelling.  EXTREMITIES: Without clubbing, cyanosis, or edema.  NEUROLOGIC: No motor or sensory deficits. Steady, even gait.  PSYCH/MENTAL STATUS: Alert, oriented x 3. Cooperative, appropriate mood and affect.   Health Maintenance Due  Topic Date Due   COVID-19 Vaccine (1 - 2023-24 season) Never done    No results found for any visits on 06/29/23.  The 10-year ASCVD risk score (Arnett DK, et al., 2019) is: 2%     Assessment & Plan:  Assessment and Plan            There are no diagnoses linked to this encounter. No orders of the defined types were placed in this encounter.  No images are attached to the encounter or orders placed in the encounter. No orders of the defined types were placed in this encounter.   No follow-ups on file.   Salvatore Decent, FNP

## 2023-06-29 ENCOUNTER — Ambulatory Visit (INDEPENDENT_AMBULATORY_CARE_PROVIDER_SITE_OTHER): Payer: Managed Care, Other (non HMO) | Admitting: Internal Medicine

## 2023-06-29 ENCOUNTER — Encounter: Payer: Self-pay | Admitting: Internal Medicine

## 2023-06-29 VITALS — BP 122/76 | HR 74 | Temp 98.3°F | Ht 71.0 in | Wt 227.2 lb

## 2023-06-29 DIAGNOSIS — I10 Essential (primary) hypertension: Secondary | ICD-10-CM | POA: Diagnosis not present

## 2023-06-29 DIAGNOSIS — F909 Attention-deficit hyperactivity disorder, unspecified type: Secondary | ICD-10-CM

## 2023-06-29 DIAGNOSIS — E291 Testicular hypofunction: Secondary | ICD-10-CM

## 2023-06-29 DIAGNOSIS — F419 Anxiety disorder, unspecified: Secondary | ICD-10-CM | POA: Diagnosis not present

## 2023-06-29 DIAGNOSIS — T8131XD Disruption of external operation (surgical) wound, not elsewhere classified, subsequent encounter: Secondary | ICD-10-CM

## 2023-06-29 DIAGNOSIS — M87052 Idiopathic aseptic necrosis of left femur: Secondary | ICD-10-CM

## 2023-06-29 DIAGNOSIS — E669 Obesity, unspecified: Secondary | ICD-10-CM

## 2023-06-29 DIAGNOSIS — F17200 Nicotine dependence, unspecified, uncomplicated: Secondary | ICD-10-CM

## 2023-06-29 LAB — CBC WITH DIFFERENTIAL/PLATELET
Basophils Absolute: 0 10*3/uL (ref 0.0–0.1)
Basophils Relative: 0.3 % (ref 0.0–3.0)
Eosinophils Absolute: 0.1 10*3/uL (ref 0.0–0.7)
Eosinophils Relative: 3.7 % (ref 0.0–5.0)
HCT: 47.4 % (ref 39.0–52.0)
Hemoglobin: 16 g/dL (ref 13.0–17.0)
Lymphocytes Relative: 30 % (ref 12.0–46.0)
Lymphs Abs: 1.2 10*3/uL (ref 0.7–4.0)
MCHC: 33.7 g/dL (ref 30.0–36.0)
MCV: 87.7 fL (ref 78.0–100.0)
Monocytes Absolute: 0.4 10*3/uL (ref 0.1–1.0)
Monocytes Relative: 10.7 % (ref 3.0–12.0)
Neutro Abs: 2.2 10*3/uL (ref 1.4–7.7)
Neutrophils Relative %: 55.3 % (ref 43.0–77.0)
Platelets: 178 10*3/uL (ref 150.0–400.0)
RBC: 5.41 Mil/uL (ref 4.22–5.81)
RDW: 13.5 % (ref 11.5–15.5)
WBC: 4 10*3/uL (ref 4.0–10.5)

## 2023-06-29 LAB — COMPREHENSIVE METABOLIC PANEL
ALT: 15 U/L (ref 0–53)
AST: 15 U/L (ref 0–37)
Albumin: 4.6 g/dL (ref 3.5–5.2)
Alkaline Phosphatase: 74 U/L (ref 39–117)
BUN: 28 mg/dL — ABNORMAL HIGH (ref 6–23)
CO2: 29 meq/L (ref 19–32)
Calcium: 9.4 mg/dL (ref 8.4–10.5)
Chloride: 103 meq/L (ref 96–112)
Creatinine, Ser: 1.12 mg/dL (ref 0.40–1.50)
GFR: 81.55 mL/min (ref >60.00)
Glucose, Bld: 89 mg/dL (ref 70–99)
Potassium: 4.3 meq/L (ref 3.5–5.1)
Sodium: 139 meq/L (ref 135–145)
Total Bilirubin: 0.8 mg/dL (ref 0.2–1.2)
Total Protein: 6.7 g/dL (ref 6.0–8.3)

## 2023-06-29 LAB — LIPID PANEL
Cholesterol: 171 mg/dL (ref 0–200)
HDL: 26.8 mg/dL — ABNORMAL LOW (ref >39.00)
LDL Cholesterol: 122 mg/dL — ABNORMAL HIGH (ref 0–99)
NonHDL: 143.8
Total CHOL/HDL Ratio: 6
Triglycerides: 111 mg/dL (ref 0.0–149.0)
VLDL: 22.2 mg/dL (ref 0.0–40.0)

## 2023-06-29 LAB — TSH: TSH: 1.57 u[IU]/mL (ref 0.35–5.50)

## 2023-06-29 MED ORDER — ALPRAZOLAM 0.5 MG PO TABS: 0.5000 mg | ORAL_TABLET | Freq: Every day | ORAL | 0 refills | Status: DC | PRN

## 2023-06-29 MED ORDER — AMPHETAMINE-DEXTROAMPHETAMINE 10 MG PO TABS
20.0000 mg | ORAL_TABLET | Freq: Two times a day (BID) | ORAL | Status: DC
Start: 2023-06-29 — End: 2023-07-07

## 2023-07-01 ENCOUNTER — Other Ambulatory Visit: Payer: Self-pay | Admitting: Internal Medicine

## 2023-07-01 DIAGNOSIS — E291 Testicular hypofunction: Secondary | ICD-10-CM

## 2023-07-01 MED ORDER — TESTOSTERONE CYPIONATE 100 MG/ML IM SOLN: 200.0000 mg | INTRAMUSCULAR | Status: DC

## 2023-07-02 ENCOUNTER — Encounter: Payer: Self-pay | Admitting: Internal Medicine

## 2023-07-03 LAB — TESTOSTERONE,FREE AND TOTAL
Testosterone, Free: 5.9 pg/mL — ABNORMAL LOW (ref 6.8–21.5)
Testosterone: 360 ng/dL (ref 264–916)

## 2023-07-04 ENCOUNTER — Ambulatory Visit
Admission: RE | Admit: 2023-07-04 | Discharge: 2023-07-04 | Disposition: A | Discharge status: Home / Self Care | Payer: Managed Care, Other (non HMO) | Source: Ambulatory Visit | Attending: Orthopaedic Surgery | Admitting: Orthopaedic Surgery

## 2023-07-04 DIAGNOSIS — M545 Low back pain, unspecified: Secondary | ICD-10-CM

## 2023-07-07 ENCOUNTER — Encounter: Payer: Self-pay | Admitting: Internal Medicine

## 2023-07-07 DIAGNOSIS — F909 Attention-deficit hyperactivity disorder, unspecified type: Secondary | ICD-10-CM

## 2023-07-07 MED ORDER — AMPHETAMINE-DEXTROAMPHETAMINE 20 MG PO TABS: 20.0000 mg | ORAL_TABLET | Freq: Two times a day (BID) | ORAL | 0 refills | Status: DC

## 2023-07-09 ENCOUNTER — Encounter: Payer: Self-pay | Admitting: Internal Medicine

## 2023-07-12 ENCOUNTER — Other Ambulatory Visit: Payer: Self-pay

## 2023-07-12 DIAGNOSIS — F5101 Primary insomnia: Secondary | ICD-10-CM

## 2023-07-12 MED ORDER — QUETIAPINE FUMARATE 25 MG PO TABS: ORAL_TABLET | ORAL | 3 refills | Status: DC

## 2023-07-20 ENCOUNTER — Other Ambulatory Visit: Payer: Self-pay | Admitting: Family Medicine

## 2023-07-20 DIAGNOSIS — F419 Anxiety disorder, unspecified: Secondary | ICD-10-CM

## 2023-08-05 ENCOUNTER — Other Ambulatory Visit: Payer: Self-pay | Admitting: Internal Medicine

## 2023-08-05 DIAGNOSIS — F909 Attention-deficit hyperactivity disorder, unspecified type: Secondary | ICD-10-CM

## 2023-08-05 MED ORDER — AMPHETAMINE-DEXTROAMPHETAMINE 20 MG PO TABS
20.0000 mg | ORAL_TABLET | Freq: Two times a day (BID) | ORAL | 0 refills | Status: DC
Start: 2023-08-05 — End: 2023-09-02

## 2023-08-05 NOTE — Telephone Encounter (Signed)
Last Ov 06/29/23 Filled 07/07/23

## 2023-08-17 ENCOUNTER — Other Ambulatory Visit (HOSPITAL_COMMUNITY): Payer: Self-pay | Admitting: Psychiatry

## 2023-08-18 ENCOUNTER — Other Ambulatory Visit: Payer: Self-pay | Admitting: Internal Medicine

## 2023-08-18 DIAGNOSIS — F419 Anxiety disorder, unspecified: Secondary | ICD-10-CM

## 2023-08-19 NOTE — Telephone Encounter (Signed)
Last Ov 06/29/23 Filled 06/29/23

## 2023-09-02 ENCOUNTER — Other Ambulatory Visit: Payer: Self-pay | Admitting: Internal Medicine

## 2023-09-02 DIAGNOSIS — F909 Attention-deficit hyperactivity disorder, unspecified type: Secondary | ICD-10-CM

## 2023-09-02 MED ORDER — AMPHETAMINE-DEXTROAMPHETAMINE 20 MG PO TABS: 20.0000 mg | ORAL_TABLET | Freq: Two times a day (BID) | ORAL | 0 refills | Status: DC

## 2023-09-02 NOTE — Telephone Encounter (Signed)
 Last Ov 06/29/23 Filled 08/05/23

## 2023-09-05 ENCOUNTER — Other Ambulatory Visit: Payer: Self-pay | Admitting: Internal Medicine

## 2023-09-05 DIAGNOSIS — E669 Obesity, unspecified: Secondary | ICD-10-CM

## 2023-09-06 ENCOUNTER — Encounter: Payer: Self-pay | Admitting: Internal Medicine

## 2023-09-06 ENCOUNTER — Other Ambulatory Visit: Payer: Self-pay

## 2023-09-06 DIAGNOSIS — E669 Obesity, unspecified: Secondary | ICD-10-CM

## 2023-09-06 MED ORDER — ZEPBOUND 5 MG/0.5ML ~~LOC~~ SOAJ: 5.0000 mg | SUBCUTANEOUS | 0 refills | Status: DC

## 2023-09-16 ENCOUNTER — Other Ambulatory Visit: Payer: Self-pay | Admitting: Internal Medicine

## 2023-09-16 DIAGNOSIS — F419 Anxiety disorder, unspecified: Secondary | ICD-10-CM

## 2023-09-16 MED ORDER — ALPRAZOLAM 0.5 MG PO TABS: 0.5000 mg | ORAL_TABLET | Freq: Every day | ORAL | 0 refills | Status: DC | PRN

## 2023-09-16 NOTE — Telephone Encounter (Signed)
Last Ov 06/29/23 Filled 08/19/23

## 2023-09-21 ENCOUNTER — Other Ambulatory Visit: Payer: Self-pay | Admitting: Internal Medicine

## 2023-09-21 DIAGNOSIS — F5101 Primary insomnia: Secondary | ICD-10-CM

## 2023-09-22 ENCOUNTER — Encounter: Payer: Self-pay | Admitting: Internal Medicine

## 2023-09-29 ENCOUNTER — Encounter: Payer: Self-pay | Admitting: Internal Medicine

## 2023-09-29 ENCOUNTER — Telehealth: Payer: Self-pay | Admitting: Pharmacy Technician

## 2023-09-29 ENCOUNTER — Other Ambulatory Visit (HOSPITAL_COMMUNITY): Payer: Self-pay

## 2023-09-29 ENCOUNTER — Ambulatory Visit: Payer: Managed Care, Other (non HMO) | Admitting: Internal Medicine

## 2023-09-29 VITALS — BP 139/82 | HR 76 | Temp 98.7°F | Resp 18 | Wt 197.6 lb

## 2023-09-29 DIAGNOSIS — F419 Anxiety disorder, unspecified: Secondary | ICD-10-CM | POA: Diagnosis not present

## 2023-09-29 DIAGNOSIS — E291 Testicular hypofunction: Secondary | ICD-10-CM

## 2023-09-29 DIAGNOSIS — Z87898 Personal history of other specified conditions: Secondary | ICD-10-CM

## 2023-09-29 DIAGNOSIS — N529 Male erectile dysfunction, unspecified: Secondary | ICD-10-CM

## 2023-09-29 DIAGNOSIS — F909 Attention-deficit hyperactivity disorder, unspecified type: Secondary | ICD-10-CM | POA: Insufficient documentation

## 2023-09-29 DIAGNOSIS — E669 Obesity, unspecified: Secondary | ICD-10-CM

## 2023-09-29 DIAGNOSIS — I10 Essential (primary) hypertension: Secondary | ICD-10-CM | POA: Diagnosis not present

## 2023-09-29 DIAGNOSIS — F322 Major depressive disorder, single episode, severe without psychotic features: Secondary | ICD-10-CM

## 2023-09-29 DIAGNOSIS — M87052 Idiopathic aseptic necrosis of left femur: Secondary | ICD-10-CM

## 2023-09-29 LAB — CBC WITH DIFFERENTIAL/PLATELET
Basophils Absolute: 0 10*3/uL (ref 0.0–0.1)
Basophils Relative: 0.4 % (ref 0.0–3.0)
Eosinophils Absolute: 0.2 10*3/uL (ref 0.0–0.7)
Eosinophils Relative: 3.9 % (ref 0.0–5.0)
HCT: 46.6 % (ref 39.0–52.0)
Hemoglobin: 16.1 g/dL (ref 13.0–17.0)
Lymphocytes Relative: 23.8 % (ref 12.0–46.0)
Lymphs Abs: 1.2 10*3/uL (ref 0.7–4.0)
MCHC: 34.5 g/dL (ref 30.0–36.0)
MCV: 90.2 fL (ref 78.0–100.0)
Monocytes Absolute: 0.4 10*3/uL (ref 0.1–1.0)
Monocytes Relative: 8.2 % (ref 3.0–12.0)
Neutro Abs: 3.1 10*3/uL (ref 1.4–7.7)
Neutrophils Relative %: 63.7 % (ref 43.0–77.0)
Platelets: 210 10*3/uL (ref 150.0–400.0)
RBC: 5.16 Mil/uL (ref 4.22–5.81)
RDW: 13.8 % (ref 11.5–15.5)
WBC: 4.9 10*3/uL (ref 4.0–10.5)

## 2023-09-29 LAB — COMPREHENSIVE METABOLIC PANEL
ALT: 11 U/L (ref 0–53)
AST: 14 U/L (ref 0–37)
Albumin: 4.6 g/dL (ref 3.5–5.2)
Alkaline Phosphatase: 68 U/L (ref 39–117)
BUN: 28 mg/dL — ABNORMAL HIGH (ref 6–23)
CO2: 27 meq/L (ref 19–32)
Calcium: 9.5 mg/dL (ref 8.4–10.5)
Chloride: 102 meq/L (ref 96–112)
Creatinine, Ser: 1.07 mg/dL (ref 0.40–1.50)
GFR: 85.99 mL/min (ref >60.00)
Glucose, Bld: 84 mg/dL (ref 70–99)
Potassium: 3.9 meq/L (ref 3.5–5.1)
Sodium: 137 meq/L (ref 135–145)
Total Bilirubin: 0.6 mg/dL (ref 0.2–1.2)
Total Protein: 6.8 g/dL (ref 6.0–8.3)

## 2023-09-29 LAB — TESTOSTERONE: Testosterone: 535.54 ng/dL (ref 300.00–890.00)

## 2023-09-29 MED ORDER — TESTOSTERONE CYPIONATE 100 MG/ML IM SOLN: 100.0000 mg | INTRAMUSCULAR | Status: DC

## 2023-09-29 MED ORDER — TADALAFIL 5 MG PO TABS: 5.0000 mg | ORAL_TABLET | Freq: Every day | ORAL | 1 refills | Status: DC

## 2023-09-29 MED ORDER — AMPHETAMINE-DEXTROAMPHETAMINE 20 MG PO TABS: 20.0000 mg | ORAL_TABLET | Freq: Two times a day (BID) | ORAL | 0 refills | Status: DC

## 2023-09-29 NOTE — Telephone Encounter (Signed)
 Pharmacy Patient Advocate Encounter  Received notification from [facility] that Prior Authorization for Tadalafil  5MG  tablets has been APPROVED from 09/29/2023 to 09/28/2026. Ran test claim, Copay is $10.00. This test claim was processed through [facility]- copay amounts may vary at other pharmacies due to pharmacy/plan contracts, or as the patient moves through the different stages of their insurance plan.  KeyBETHA DICKER PA #/Case ID/Reference #: [id number]

## 2023-09-29 NOTE — Progress Notes (Signed)
 [facility] [address] [place] [address] Dept: [phone] Dept Fax: [phone]    Subjective:   Philip Cuevas [facility] Jan 10, 1981 09/29/2023  Chief Complaint  Patient presents with   Medical Managment of Chronic Issue     3 month follow up ADD/ADHD, GAD, HTN, and testosterone  levels. RX refills are needed for adderall 20mg      HPI: Philip Cuevas presents today for re-assessment and management of chronic medical conditions.  Discussed the use of AI scribe software for clinical note transcription with the patient, who gave verbal consent to proceed.  History of Present Illness   The patient, with a history of obesity, ADHD, hypertension, ED, male hypogonadism, and substance abuse, presents for a follow-up visit. The patient reports significant weight loss of 30 pounds since the last visit, attributing the success to a combination of Zepbound  5mg  daily and dietary changes, including eliminating carbs and sugar and increasing protein intake. The patient tolerates Zepbound  well, with occasional stomach upset a few days after the shot. The patient also reports good control of ADHD symptoms with Adderall 20mg  twice a day, although the initial noticeable effect has diminished. The patient has been tapering off venlafaxine , currently taking it every three days, and reports good mood overall with occasional irritability. Irritability and anxiety increased during the holiday season with family, he had to take his Alprazolam  at that time (he is prescribed only 5 tablets at a time that he uses very sparingly.) The patient has been sober for 16 months and is actively involved in Narcotics Anonymous meetings. He is now currently sponsoring others in the program. The patient has stopped taking amlodipine  for about three weeks, resulting in a slight increase in blood pressure. The patient has been on testosterone  therapy and is interested in  switching to a testosterone  pellet. The patient has also been taking Cialis  for erectile dysfunction and is interested in switching to a daily dose. The patient has stopped taking Mobic , a medication prescribed post-surgery.      BP Readings from Last 3 Encounters:  09/29/23 139/82  06/29/23 122/76  06/08/23 128/84   Wt Readings from Last 3 Encounters:  09/29/23 197 lb 9.6 oz (89.6 kg)  06/29/23 227 lb 3.2 oz (103.1 kg)  06/08/23 237 lb 6.4 oz (107.7 kg)     The following portions of the patient's history were reviewed and updated as appropriate: past medical history, past surgical history, family history, social history, allergies, medications, and problem list.   Patient Active Problem List   Diagnosis Date Noted   Attention deficit hyperactivity disorder (ADHD) 09/29/2023   Premature ejaculation 06/08/2023   Suspicious nevus 02/04/2023   Avascular necrosis of hip, 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   Tachycardia 07/11/2022   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   History of migraine headaches 08/29/2018   Venous stasis dermatitis of both lower extremities 12/22/2017   Obstructive sleep apnea 04/30/2017   Hypertriglyceridemia 03/03/2017   Left knee pain with intra-articular loose body 05/02/2012   Essential (primary) hypertension 05/07/2008   Erectile dysfunction 07/06/2007   MDD (major depressive disorder), severe (HCC) 11/24/2006   ASTHMA, EXERCISE INDUCED 11/24/2006   Insomnia 06/04/2006   Tobacco use disorder 06/04/2006   GAD 06/01/2006   Past Medical History:  Diagnosis Date   Alcohol use disorder 07/14/2022   H/o DT 06/2022     Alcohol withdrawal delirium (HCC) 07/07/2022   Anxiety    Asthma    mild, intermittent   Depression  Hypertension    Knee injury    Narcotic withdrawal (HCC) 07/12/2022   Opioid use disorder 07/06/2022   Polysubstance abuse (HCC) 07/10/2022   Tobacco use disorder 06/04/2006   Qualifier: Diagnosis of   By: Waylan ROSALEA Pao       Past Surgical History:   Procedure Laterality Date   DENTAL SURGERY     PILONIDAL CYST EXCISION N/A 04/12/2015   Procedure: CYST EXCISION PILONIDAL SIMPLE;  Surgeon: Oneil Budge, MD;  Location: [facility];  Service: General;  Laterality: N/A;   TONSILECTOMY, ADENOIDECTOMY, BILATERAL MYRINGOTOMY AND TUBES     Family History  Problem Relation Age of Onset   Hypertension Mother    Hyperlipidemia Mother    Colon cancer Mother 49   Depression Father        committed suicide    Current Outpatient Medications:    ALPRAZolam  (XANAX ) 0.5 MG tablet, Take 1 tablet (0.5 mg total) by mouth daily as needed for anxiety., Disp: 5 tablet, Rfl: 0   amLODipine  (NORVASC ) 2.5 MG tablet, Take 1 tablet (2.5 mg total) by mouth daily., Disp: 30 tablet, Rfl: 2   amoxicillin  (AMOXIL ) 500 MG tablet, SMARTSIG:4 Tablet(s) By Mouth, Disp: , Rfl:    B-D 3CC LUER-LOK SYR 18GX1-1/2 18G X 1-1/2 3 ML MISC, USE EVERY 2 WEEKS FOR TESTOSTERONE  INJECTION, Disp: , Rfl:    NEEDLE, DISP, 22 G (BD DISP NEEDLES) 22G X 1-1/2 MISC, USE EVERY 2 WEEKS FOR TESTOSTERONE  INJECTIONS, Disp: 50 each, Rfl: 11   QUEtiapine  (SEROQUEL ) 25 MG tablet, TAKE 1 TABLET BY MOUTH EVERY NIGHT. MAY INCREASE TO 2 TABLETS BY MOUTH EVERY NIGHT AFTER A WEEK IF INSUFFICIENT EFFICACY, Disp: 30 tablet, Rfl: 3   Syringe, Disposable, (EASY GLIDE LUER LOCK SYRINGE) 1 ML MISC, 1 each by Does not apply route as needed., Disp: 25 each, Rfl: 0   tadalafil  (CIALIS ) 5 MG tablet, Take 1 tablet (5 mg total) by mouth daily., Disp: 90 tablet, Rfl: 1   testosterone  cypionate (DEPOTESTOTERONE CYPIONATE) 100 MG/ML injection, Inject 2 mLs (200 mg total) into the muscle every 14 (fourteen) days. For IM use only, Disp: , Rfl:    tirzepatide  (ZEPBOUND ) 5 MG/0.5ML Pen, Inject 5 mg into the skin once a week. ADMINISTER 5 MG UNDER THE SKIN 1 TIME A WEEK, Disp: 6 mL, Rfl: 0   venlafaxine  XR (EFFEXOR -XR) 37.5 MG 24 hr capsule, TAKE 1 CAPSULE(37.5 MG) BY MOUTH DAILY WITH BREAKFAST, Disp: 30 capsule, Rfl: 0    amphetamine -dextroamphetamine  (ADDERALL) 20 MG tablet, Take 1 tablet (20 mg total) by mouth 2 (two) times daily., Disp: 60 tablet, Rfl: 0 No Known Allergies   ROS: A complete ROS was performed with pertinent positives/negatives noted in the HPI. The remainder of the ROS are negative.    Objective:   Today's Vitals   09/29/23 1038  BP: 139/82  Pulse: 76  Resp: 18  Temp: 98.7 F (37.1 C)  TempSrc: Temporal  SpO2: 99%  Weight: 197 lb 9.6 oz (89.6 kg)  PainSc: 0-No pain    GENERAL: Well-appearing, in NAD. Well nourished.  SKIN: Pink, warm and dry. No rash, lesion, ulceration, or ecchymoses.  NECK: Trachea midline. Full ROM w/o pain or tenderness. No lymphadenopathy.  RESPIRATORY: Chest wall symmetrical. Respirations even and non-labored. Breath sounds clear to auscultation bilaterally.  CARDIAC: S1, S2 present, regular rate and rhythm. Peripheral pulses 2+ bilaterally.  MSK: Muscle tone and strength appropriate for age.  EXTREMITIES: Without clubbing, cyanosis, or edema.  NEUROLOGIC: No motor or  sensory deficits. Steady, even gait.  PSYCH/MENTAL STATUS: Alert, oriented x 3. Cooperative, appropriate mood and affect.   There are no preventive care reminders to display for this patient.  No results found for any visits on 09/29/23.  The 10-year ASCVD risk score (Arnett DK, et al., 2019) is: 3%     Assessment & Plan:  Assessment and Plan    Obesity Significant weight loss (30 lbs) since last visit, now at 197 lbs. Patient tolerating Zepbound  5mg  daily well, with occasional GI upset managed with Imodium . Patient also made significant dietary changes, including low carb, high protein diet. -Continue Zepbound  5mg  daily. -Encourage continuation of dietary changes and regular exercise.  Hypertension Blood pressure slightly elevated at 139/82, patient has not been taking amlodipine  for the past three weeks. -Advise patient to monitor blood pressure at home. -Resume amlodipine  2.5mg   daily if blood pressure consistently above 120/80.  ADHD Patient reports Adderall 20mg  BID is effective in managing symptoms. -Continue Adderall 20mg  BID.  Depression/Anxiety Patient has been self-tapering venlafaxine , now taking every three days. Reports good mood but increased irritability. -Advise patient to continue current regimen or consider increasing to every other day if irritability persists. - Continue Xanax  very sparingly   Testosterone  Replacement Therapy Patient dosing to 100mg  weekly. -Check testosterone  level, prostate level, and blood counts today. - Advised patient to contact urology for testosterone  pellets  Erectile Dysfunction Patient previously on Cialis  5mg  daily, wishes to resume. -Resume Cialis  5mg  daily, monitor for potential blood pressure lowering effects.  Hip Surgery s/p Avascular Necrosis of Left Hip Patient reports occasional discomfort in extreme positions, otherwise doing well. -Discontinue daily Mobic  due to potential effects on kidney function. -Advise use of Tylenol  for pain as needed, occasional Aleve  acceptable.  Substance Use Disorder Patient reports 16 months of sobriety, attending Narcotics Anonymous meetings. -Continue current support and sobriety strategies.  Follow-up in 6 months.      Orders Placed This Encounter  Procedures   CBC with Differential/Platelet   Comprehensive metabolic panel   Testosterone    PSA, total and free   No images are attached to the encounter or orders placed in the encounter. Meds ordered this encounter  Medications   tadalafil  (CIALIS ) 5 MG tablet    Sig: Take 1 tablet (5 mg total) by mouth daily.    Dispense:  90 tablet    Refill:  1    Supervising Provider:   THOMPSON, AARON B [[id number]]   amphetamine -dextroamphetamine  (ADDERALL) 20 MG tablet    Sig: Take 1 tablet (20 mg total) by mouth 2 (two) times daily.    Dispense:  60 tablet    Refill:  0    Supervising Provider:   THOMPSON, AARON B  [[id number]]    Return in about 6 months (around 03/28/2024) for Chronic Condition follow up.   Rosina Senters, FNP

## 2023-09-30 ENCOUNTER — Other Ambulatory Visit (HOSPITAL_COMMUNITY): Payer: Self-pay

## 2023-09-30 LAB — PSA, TOTAL AND FREE
PSA, % Free: 40 % (ref >25)
PSA, Free: 0.2 ng/mL
PSA, Total: 0.5 ng/mL (ref <=4.0)

## 2023-10-06 ENCOUNTER — Encounter: Payer: Self-pay | Admitting: Internal Medicine

## 2023-10-13 ENCOUNTER — Other Ambulatory Visit: Payer: Self-pay | Admitting: Internal Medicine

## 2023-10-13 DIAGNOSIS — F419 Anxiety disorder, unspecified: Secondary | ICD-10-CM

## 2023-10-14 NOTE — Telephone Encounter (Signed)
Last Ov 09/29/23 Filled 09/16/23

## 2023-10-29 ENCOUNTER — Other Ambulatory Visit: Payer: Self-pay | Admitting: Internal Medicine

## 2023-10-29 DIAGNOSIS — F909 Attention-deficit hyperactivity disorder, unspecified type: Secondary | ICD-10-CM

## 2023-10-29 MED ORDER — AMPHETAMINE-DEXTROAMPHETAMINE 20 MG PO TABS: 20.0000 mg | ORAL_TABLET | Freq: Two times a day (BID) | ORAL | 0 refills | Status: DC

## 2023-10-29 NOTE — Telephone Encounter (Signed)
 Last Ov 09/29/23 Filled 09/29/23

## 2023-11-02 ENCOUNTER — Other Ambulatory Visit: Payer: Self-pay

## 2023-11-02 DIAGNOSIS — E669 Obesity, unspecified: Secondary | ICD-10-CM

## 2023-11-02 MED ORDER — ZEPBOUND 5 MG/0.5ML ~~LOC~~ SOAJ: 5.0000 mg | SUBCUTANEOUS | 0 refills | Status: DC

## 2023-11-03 ENCOUNTER — Other Ambulatory Visit: Payer: Self-pay | Admitting: Internal Medicine

## 2023-11-03 DIAGNOSIS — E291 Testicular hypofunction: Secondary | ICD-10-CM

## 2023-11-03 NOTE — Telephone Encounter (Signed)
 Last Ov 09/29/23 Filled 09/29/23

## 2023-11-04 MED ORDER — TESTOSTERONE CYPIONATE 100 MG/ML IM SOLN: 100.0000 mg | INTRAMUSCULAR | 2 refills | Status: DC

## 2023-11-08 ENCOUNTER — Other Ambulatory Visit (HOSPITAL_BASED_OUTPATIENT_CLINIC_OR_DEPARTMENT_OTHER): Payer: Self-pay

## 2023-11-08 ENCOUNTER — Other Ambulatory Visit: Payer: Self-pay | Admitting: Internal Medicine

## 2023-11-08 ENCOUNTER — Encounter: Payer: Self-pay | Admitting: Internal Medicine

## 2023-11-08 DIAGNOSIS — E291 Testicular hypofunction: Secondary | ICD-10-CM

## 2023-11-08 NOTE — Telephone Encounter (Unsigned)
 Copied from CRM [id number]. Topic: Clinical - Prescription Issue >> Nov 08, 2023  3:49 PM Chantha C wrote: Reason for CRM: Patient sent a message to provider via MyChart, and wants to speak with provider about testosterone cypionate (DEPOTESTOTERONE CYPIONATE) 100 MG/ML injection. Patient went to [facility] pharmacy, was advised to contact the provider office because the pharmacy does not have the prescription. Patient needs the refill, please advise and call back at [phone]. Patient would prefer to go to  [id number] - [place], [place] - [address] AT [facility] [address] [place] [address] [phone]:[phone]

## 2023-11-09 MED ORDER — TESTOSTERONE CYPIONATE 100 MG/ML IM SOLN
100.0000 mg | INTRAMUSCULAR | 1 refills | Status: DC
Start: 2023-11-09 — End: 2024-01-14

## 2023-11-22 ENCOUNTER — Telehealth: Payer: Self-pay

## 2023-11-22 ENCOUNTER — Other Ambulatory Visit (HOSPITAL_COMMUNITY): Payer: Self-pay

## 2023-11-22 NOTE — Telephone Encounter (Signed)
 Pharmacy Patient Advocate Encounter   Received notification from Onbase that prior authorization for Testosterone 100 ml is required/requested.   Insurance verification completed.   The patient is insured through [facility] ADVANT[employer]/RX ADVANCE .   Per test claim: PA required; PA submitted to above mentioned insurance via CoverMyMeds Key/confirmation #/EOC [id number] Status is pending

## 2023-11-23 ENCOUNTER — Other Ambulatory Visit: Payer: Self-pay | Admitting: Internal Medicine

## 2023-11-23 ENCOUNTER — Other Ambulatory Visit (HOSPITAL_COMMUNITY): Payer: Self-pay

## 2023-11-23 DIAGNOSIS — F419 Anxiety disorder, unspecified: Secondary | ICD-10-CM

## 2023-11-23 NOTE — Telephone Encounter (Signed)
 Pharmacy Patient Advocate Encounter  Received notification from [facility] that Prior Authorization for Testosterone Cypionate 100MG /ML intramuscular solution has been APPROVED from 11/23/23 to 05/24/24. Ran test claim, Copay is $10. This test claim was processed through [facility]- copay amounts may vary at other pharmacies due to pharmacy/plan contracts, or as the patient moves through the different stages of their insurance plan.   PA #/Case ID/Reference #: [id number]

## 2023-11-24 NOTE — Telephone Encounter (Signed)
 Last Ov 09/29/23 Filled 10/14/23

## 2023-11-27 ENCOUNTER — Other Ambulatory Visit: Payer: Self-pay | Admitting: Internal Medicine

## 2023-11-27 DIAGNOSIS — F909 Attention-deficit hyperactivity disorder, unspecified type: Secondary | ICD-10-CM

## 2023-11-28 ENCOUNTER — Other Ambulatory Visit: Payer: Self-pay | Admitting: Internal Medicine

## 2023-11-28 DIAGNOSIS — F5101 Primary insomnia: Secondary | ICD-10-CM

## 2023-11-29 MED ORDER — AMPHETAMINE-DEXTROAMPHETAMINE 20 MG PO TABS
20.0000 mg | ORAL_TABLET | Freq: Two times a day (BID) | ORAL | 0 refills | Status: DC
Start: 2023-11-29 — End: 2023-12-28

## 2023-11-29 NOTE — Telephone Encounter (Signed)
 Last Ov 09/29/23 Filled 10/29/23

## 2023-12-19 ENCOUNTER — Other Ambulatory Visit: Payer: Self-pay | Admitting: Internal Medicine

## 2023-12-19 DIAGNOSIS — F419 Anxiety disorder, unspecified: Secondary | ICD-10-CM

## 2023-12-20 NOTE — Telephone Encounter (Signed)
 Last Ov 09/29/23 Filled 11/25/23

## 2023-12-28 ENCOUNTER — Other Ambulatory Visit: Payer: Self-pay | Admitting: Internal Medicine

## 2023-12-28 DIAGNOSIS — F909 Attention-deficit hyperactivity disorder, unspecified type: Secondary | ICD-10-CM

## 2023-12-28 MED ORDER — AMPHETAMINE-DEXTROAMPHETAMINE 20 MG PO TABS: 20.0000 mg | ORAL_TABLET | Freq: Two times a day (BID) | ORAL | 0 refills | Status: DC

## 2023-12-28 NOTE — Telephone Encounter (Signed)
 Requesting: amphetamine -dextroamphetamine  (ADDERALL) 20 MG tablet  Last Visit: 09/29/2023 Next Visit: 03/28/2024 Last Refill: 11/29/2023  Please Advise

## 2023-12-29 ENCOUNTER — Telehealth: Admitting: Family Medicine

## 2023-12-29 DIAGNOSIS — J302 Other seasonal allergic rhinitis: Secondary | ICD-10-CM

## 2023-12-29 MED ORDER — AZELASTINE HCL 0.1 % NA SOLN: 1.0000 | Freq: Two times a day (BID) | NASAL | 0 refills | Status: DC

## 2023-12-29 MED ORDER — IPRATROPIUM BROMIDE 0.03 % NA SOLN: 2.0000 | Freq: Two times a day (BID) | NASAL | 0 refills | Status: DC

## 2023-12-29 NOTE — Addendum Note (Signed)
 Addended by: Angelia Kelp on: 12/29/2023 11:43 AM   Modules accepted: Orders

## 2023-12-29 NOTE — Progress Notes (Signed)
 E visit for Allergic Rhinitis We are sorry that you are not feeling well.  Here is how we plan to help!  Based on what you have shared with me it looks like you have Allergic Rhinitis.  Rhinitis is when a reaction occurs that causes nasal congestion, runny nose, sneezing, and itching.  Most types of rhinitis are caused by an inflammation and are associated with symptoms in the eyes ears or throat. There are several types of rhinitis.  The most common are acute rhinitis, which is usually caused by a viral illness, allergic or seasonal rhinitis, and nonallergic or year-round rhinitis.  Nasal allergies occur certain times of the year.  Allergic rhinitis is caused when allergens in the air trigger the release of histamine in the body.  Histamine causes itching, swelling, and fluid to build up in the fragile linings of the nasal passages, sinuses and eyelids.  An itchy nose and clear discharge are common.  I recommend the following over the counter treatments: You should take a daily dose of antihistamine, continue Zyrtec  I also would recommend a nasal spray: Continue Nasacort/Nasonex nasal spray  I am prescribing Azelastine nasal spray Use 1 spray in each nostril twice daily, use with the Nasacort/Nasonex nasal spray   HOME CARE:  You can use an over-the-counter saline nasal spray as needed Avoid areas where there is heavy dust, mites, or molds Stay indoors on windy days during the pollen season Keep windows closed in home, at least in bedroom; use air conditioner. Use high-efficiency house air filter Keep windows closed in car, turn AC on re-circulate Avoid playing out with dog during pollen season  GET HELP RIGHT AWAY IF:  If your symptoms do not improve within 10 days You become short of breath You develop yellow or green discharge from your nose for over 3 days You have coughing fits  MAKE SURE YOU:  Understand these instructions Will watch your condition Will get help right away  if you are not doing well or get worse  Thank you for choosing an e-visit. Your e-visit answers were reviewed by a board certified advanced clinical practitioner to complete your personal care plan. Depending upon the condition, your plan could have included both over the counter or prescription medications. Please review your pharmacy choice. Be sure that the pharmacy you have chosen is open so that you can pick up your prescription now.  If there is a problem you may message your provider in MyChart to have the prescription routed to another pharmacy. Your safety is important to us . If you have drug allergies check your prescription carefully.  For the next 24 hours, you can use MyChart to ask questions about today's visit, request a non-urgent call back, or ask for a work or school excuse from your e-visit provider. You will get an email in the next two days asking about your experience. I hope that your e-visit has been valuable and will speed your recovery.       I have spent 5 minutes in review of e-visit questionnaire, review and updating patient chart, medical decision making and response to patient.   Angelia Kelp, PA-C

## 2024-01-13 ENCOUNTER — Other Ambulatory Visit: Payer: Self-pay | Admitting: Internal Medicine

## 2024-01-13 DIAGNOSIS — E291 Testicular hypofunction: Secondary | ICD-10-CM

## 2024-01-14 ENCOUNTER — Other Ambulatory Visit: Payer: Self-pay | Admitting: Internal Medicine

## 2024-01-14 DIAGNOSIS — E291 Testicular hypofunction: Secondary | ICD-10-CM

## 2024-01-14 MED ORDER — TESTOSTERONE CYPIONATE 100 MG/ML IM SOLN: 100.0000 mg | INTRAMUSCULAR | 1 refills | Status: DC

## 2024-01-14 NOTE — Telephone Encounter (Signed)
 Last Ov 09/29/23 Filled 11/09/23

## 2024-01-18 ENCOUNTER — Telehealth: Payer: Self-pay

## 2024-01-18 ENCOUNTER — Other Ambulatory Visit (HOSPITAL_COMMUNITY): Payer: Self-pay

## 2024-01-18 NOTE — Telephone Encounter (Signed)
 Pharmacy Patient Advocate Encounter   Received notification from CoverMyMeds that prior authorization for Zepbound  2.5 is required/requested.   Insurance verification completed.   The patient is insured through [facility] .   Per test claim: PA required; PA submitted to above mentioned insurance via CoverMyMeds Key/confirmation #/EOC [id number] Status is pending

## 2024-01-19 ENCOUNTER — Other Ambulatory Visit (HOSPITAL_COMMUNITY): Payer: Self-pay

## 2024-01-19 NOTE — Telephone Encounter (Signed)
 Pharmacy Patient Advocate Encounter  Received notification from [facility] that Prior Authorization for Zepbound  5 has been APPROVED from 01/18/24 to 01/17/25. Unable to obtain price due to refill too soon rejection, last fill date 01/18/24 next available fill date7/29/25   PA #/Case ID/Reference #: [id number]

## 2024-01-24 ENCOUNTER — Telehealth: Payer: Self-pay | Admitting: Urology

## 2024-01-24 ENCOUNTER — Telehealth: Payer: Self-pay | Admitting: Internal Medicine

## 2024-01-24 DIAGNOSIS — E291 Testicular hypofunction: Secondary | ICD-10-CM

## 2024-01-24 NOTE — Telephone Encounter (Signed)
 Called [facility] at [facility] - spoke with scheduling. I wanted to know if patient was referred to our office. Also wanted to make sure this is the only office that was treating the patient for testosterone . Patient made an appointment with our office online and was requesting a referral. Scheduling sent a message to their office.

## 2024-01-24 NOTE — Telephone Encounter (Signed)
 Copied from CRM [id number]. Topic: Referral - Question >> Jan 24, 2024  3:22 PM Albertha Alosa wrote: Reason for CRM: [facility] called in regarding patient , stated patient has scheduled a appointment with the office online , and was wondering has been seeing NP Gavin Kast for these issues is so could she please put in a referral to the office     [phone]

## 2024-01-24 NOTE — Telephone Encounter (Signed)
 Please advise if appt is needed

## 2024-01-24 NOTE — Addendum Note (Signed)
 Addended by: Ulmer Degen on: 01/24/2024 04:36 PM   Modules accepted: Orders

## 2024-01-24 NOTE — Telephone Encounter (Signed)
 No appt needed. I have placed referral.

## 2024-01-26 ENCOUNTER — Ambulatory Visit: Payer: Self-pay | Admitting: Internal Medicine

## 2024-01-26 ENCOUNTER — Encounter: Payer: Self-pay | Admitting: Internal Medicine

## 2024-01-26 ENCOUNTER — Ambulatory Visit: Admitting: Internal Medicine

## 2024-01-26 VITALS — BP 124/72 | HR 74 | Temp 97.6°F | Ht 71.0 in | Wt 184.4 lb

## 2024-01-26 DIAGNOSIS — E669 Obesity, unspecified: Secondary | ICD-10-CM | POA: Diagnosis not present

## 2024-01-26 DIAGNOSIS — B351 Tinea unguium: Secondary | ICD-10-CM

## 2024-01-26 DIAGNOSIS — F909 Attention-deficit hyperactivity disorder, unspecified type: Secondary | ICD-10-CM

## 2024-01-26 DIAGNOSIS — E291 Testicular hypofunction: Secondary | ICD-10-CM | POA: Diagnosis not present

## 2024-01-26 DIAGNOSIS — F419 Anxiety disorder, unspecified: Secondary | ICD-10-CM | POA: Diagnosis not present

## 2024-01-26 DIAGNOSIS — F5101 Primary insomnia: Secondary | ICD-10-CM

## 2024-01-26 LAB — COMPREHENSIVE METABOLIC PANEL WITH GFR
ALT: 13 U/L (ref 0–53)
AST: 13 U/L (ref 0–37)
Albumin: 4.6 g/dL (ref 3.5–5.2)
Alkaline Phosphatase: 50 U/L (ref 39–117)
BUN: 25 mg/dL — ABNORMAL HIGH (ref 6–23)
CO2: 29 meq/L (ref 19–32)
Calcium: 9.7 mg/dL (ref 8.4–10.5)
Chloride: 102 meq/L (ref 96–112)
Creatinine, Ser: 1.06 mg/dL (ref 0.40–1.50)
GFR: 86.77 mL/min (ref >60.00)
Glucose, Bld: 92 mg/dL (ref 70–99)
Potassium: 4.1 meq/L (ref 3.5–5.1)
Sodium: 138 meq/L (ref 135–145)
Total Bilirubin: 0.6 mg/dL (ref 0.2–1.2)
Total Protein: 6.4 g/dL (ref 6.0–8.3)

## 2024-01-26 LAB — TESTOSTERONE: Testosterone: 908.4 ng/dL — ABNORMAL HIGH (ref 300.00–890.00)

## 2024-01-26 MED ORDER — AMPHETAMINE-DEXTROAMPHETAMINE 20 MG PO TABS: 20.0000 mg | ORAL_TABLET | Freq: Two times a day (BID) | ORAL | 0 refills | Status: DC

## 2024-01-26 MED ORDER — SEMAGLUTIDE-WEIGHT MANAGEMENT 0.5 MG/0.5ML ~~LOC~~ SOAJ: 0.5000 mg | SUBCUTANEOUS | 3 refills | Status: DC

## 2024-01-26 MED ORDER — QUETIAPINE FUMARATE 25 MG PO TABS: ORAL_TABLET | ORAL | 3 refills | Status: AC

## 2024-01-26 MED ORDER — ALPRAZOLAM 0.5 MG PO TABS
0.5000 mg | ORAL_TABLET | Freq: Every day | ORAL | 0 refills | Status: DC | PRN
Start: 2024-01-26 — End: 2024-02-22

## 2024-01-26 NOTE — Progress Notes (Signed)
 [facility] [address] [place] [address] Dept: [phone] Dept Fax: [phone]  Acute Care Office Visit  Subjective:   Philip Cuevas [facility] [birth year] 01/26/2024  Chief Complaint  Patient presents with   Medication Refill   Nail Problem    HPI: Discussed the use of AI scribe software for clinical note transcription with the patient, who gave verbal consent to proceed.  History of Present Illness   Philip Cuevas "Philip Cuevas" is a 43 year old male who presents for follow up and discussion of medications.   He is experiencing issues with his prescription coverage for Zepbound , as his insurance through [employer] has placed it on a non-preferred list, requiring a switch to an alternative medication like Wegovy. He currently has a pack of four Zepbound  injections left and is on a 5 mg dose. He is seeking to switch to [facility] to avoid paying full price for Zepbound .  He reports a weight reduction to 175 pounds but mentions regaining 10 pounds after resuming a diet high in sugar and carbs. He is continuing to work on his diet. Making healthy dietary choices such as lean proteins, vegetables. Avoid sodas.   He has been on testosterone  therapy, previously receiving a 200 mg weekly dose, but his recent refill was adjusted to 100 mg weekly. He feels better on the 200 mg dose. His last testosterone  level check was in April, showing a level of 530, which is an improvement from previous levels. PCP records show patient has been kept on 100mg  once weekly, hence why that dose was sent in. We will check Testosterone  levels today. He has an upcoming appt with urology to discuss testosterone  pellets vs injections.   He reports a thickened, painful toenail on right foot, which has not grown in months and is causing discomfort, especially during golf. He suspects toenail fungus and has scheduled a podiatry appointment for further evaluation on  01/28/24  He is currently taking Adderall, with only two days left of his supply, ADHD well controlled on current dose.   Anxiety and sleep is well controlled on Seroquel  and PRN use of Alprazolam . He has discontinued Effexor  following a previous appointment and reports occasional anxiety but no significant depression.   He inquires about his PSA test results, noting confusion over the numbers and their implications. His PSA levels are being monitored due to his testosterone  therapy.      Wt Readings from Last 3 Encounters:  01/26/24 184 lb 6.4 oz (83.6 kg)  09/29/23 197 lb 9.6 oz (89.6 kg)  06/29/23 227 lb 3.2 oz (103.1 kg)      01/26/2024    8:29 AM 09/29/2023   10:50 AM 05/18/2023    9:11 AM  Depression screen PHQ 2/9  Decreased Interest 0 0 0  Down, Depressed, Hopeless 0 0 0  PHQ - 2 Score 0 0 0  Altered sleeping  0 0  Tired, decreased energy  0 0  Change in appetite  0 0  Feeling bad or failure about yourself   0 0  Trouble concentrating  0 0  Moving slowly or fidgety/restless  0 0  Suicidal thoughts  0 0  PHQ-9 Score  0 0  Difficult doing work/chores  Not difficult at all Not difficult at all     The following portions of the patient's history were reviewed and updated as appropriate: past medical history, past surgical history, family history, social history, allergies, medications, and problem list.  Patient Active Problem List   Diagnosis Date Noted   Attention deficit hyperactivity disorder (ADHD) 09/29/2023   Premature ejaculation 06/08/2023   Suspicious nevus 02/04/2023   Avascular necrosis of hip, 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   Tachycardia 07/11/2022   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   History of migraine headaches 08/29/2018   Venous stasis dermatitis of both lower extremities 12/22/2017   Obstructive sleep apnea 04/30/2017   Hypertriglyceridemia 03/03/2017   Left knee pain with intra-articular loose body 05/02/2012   Essential (primary) hypertension 05/07/2008    Erectile dysfunction 07/06/2007   MDD (major depressive disorder), severe (HCC) 11/24/2006   ASTHMA, EXERCISE INDUCED 11/24/2006   Insomnia 06/04/2006   Tobacco use disorder 06/04/2006   GAD 06/01/2006   Past Medical History:  Diagnosis Date   Alcohol use disorder 07/14/2022   H/o DT 06/2022     Alcohol withdrawal delirium (HCC) 07/07/2022   Anxiety    Asthma    mild, intermittent   Depression    Hypertension    Knee injury    Narcotic withdrawal (HCC) 07/12/2022   Opioid use disorder 07/06/2022   Polysubstance abuse (HCC) 07/10/2022   Tobacco use disorder 06/04/2006   Qualifier: Diagnosis of   By: Allene Ards       Past Surgical History:  Procedure Laterality Date   DENTAL SURGERY     PILONIDAL CYST EXCISION N/A 04/12/2015   Procedure: CYST EXCISION PILONIDAL SIMPLE;  Surgeon: Alanda Allegra, MD;  Location: [facility];  Service: General;  Laterality: N/A;   TONSILECTOMY, ADENOIDECTOMY, BILATERAL MYRINGOTOMY AND TUBES     Family History  Problem Relation Age of Onset   Hypertension Mother    Hyperlipidemia Mother    Colon cancer Mother 33   Depression Father        committed suicide    Current Outpatient Medications:    amLODipine  (NORVASC ) 2.5 MG tablet, Take 1 tablet (2.5 mg total) by mouth daily., Disp: 30 tablet, Rfl: 2   B-D 3CC LUER-LOK SYR 18GX1-1/2 18G X 1-1/2" 3 ML MISC, USE EVERY 2 WEEKS FOR TESTOSTERONE  INJECTION, Disp: , Rfl:    NEEDLE, DISP, 22 G (BD DISP NEEDLES) 22G X 1-1/2" MISC, USE EVERY 2 WEEKS FOR TESTOSTERONE  INJECTIONS, Disp: 50 each, Rfl: 11   Semaglutide-Weight Management 0.5 MG/0.5ML SOAJ, Inject 0.5 mg into the skin once a week., Disp: 2 mL, Rfl: 3   Syringe, Disposable, (EASY GLIDE LUER LOCK SYRINGE) 1 ML MISC, 1 each by Does not apply route as needed., Disp: 25 each, Rfl: 0   tadalafil  (CIALIS ) 5 MG tablet, Take 1 tablet (5 mg total) by mouth daily., Disp: 90 tablet, Rfl: 1   testosterone  cypionate (DEPOTESTOTERONE CYPIONATE) 100 MG/ML  injection, Inject 1 mL (100 mg total) into the muscle every 7 (seven) days. For IM use only, Disp: 4 mL, Rfl: 1   ALPRAZolam  (XANAX ) 0.5 MG tablet, Take 1 tablet (0.5 mg total) by mouth daily as needed for anxiety., Disp: 5 tablet, Rfl: 0   amphetamine -dextroamphetamine  (ADDERALL) 20 MG tablet, Take 1 tablet (20 mg total) by mouth 2 (two) times daily., Disp: 60 tablet, Rfl: 0   QUEtiapine  (SEROQUEL ) 25 MG tablet, TAKE 1 TABLET BY MOUTH EVERY NIGHT. MAY INCREASE TO 2 TABLETS BY MOUTH EVERY NIGHT AFTER A WEEK IF INSUFFICIENT EFFICACY, Disp: 90 tablet, Rfl: 3 No Known Allergies   ROS: A complete ROS was performed with pertinent positives/negatives noted in the HPI. The remainder of the ROS are negative.    Objective:   Today's  Vitals   01/26/24 0829  BP: 124/72  Pulse: 74  Temp: 97.6 F (36.4 C)  TempSrc: Temporal  SpO2: 98%  Weight: 184 lb 6.4 oz (83.6 kg)  Height: 5\' 11"  (1.803 m)   Body mass index is 25.72 kg/m.  GENERAL: Well-appearing, in NAD. Well nourished.  SKIN: Pink, warm and dry. Thickening and yellow-brown discoloration to right great toenail. Toenail beginning to separate from skin at proximal nail fold RESPIRATORY: Chest wall symmetrical. Respirations even and non-labored.  EXTREMITIES: Without clubbing, cyanosis, or edema.  NEUROLOGIC: Steady, even gait.  PSYCH/MENTAL STATUS: Alert, oriented x 3. Cooperative, appropriate mood and affect.   PDMP reviewed during this encounter.  Assessment & Plan:  Assessment and Plan    Obesity Switch from Zepbound  to [facility] due to insurance. Weight loss achieved but regained 10 lbs. Wegovy dosing equivalent to Zepbound . Informed about potential side effects. - Prescribe Wegovy 0.5 mg equivalent to current Zepbound  dose. - Instruct to finish current Zepbound  supply before starting Wegovy. - Monitor for side effects such as nausea and upset stomach.  Hypogonadism - Order testosterone  level test. - Consider adjusting testosterone   dose based on test results. - Continue referral to urology for potential testosterone  pellet therapy as scheduled  Attention Deficit Hyperactivity Disorder (ADHD) Adderall effective in managing symptoms. - Refill Adderall prescription. - PDMP reviewed - 30 tablets of Adderall last filled on 12/29/23  Generalized Anxiety Disorder Managing anxiety with Alprazolam  and Seroquel . Occasional panic episodes, no significant depression. Current regimen effective. - Refill Alprazolam  and Seroquel  prescriptions. - PDMP reviewed - 5 tablets of Alprazolam  0.5mg  last filled on 12/22/23  Onychomycosis Thickened, discolored toenail causing pain. Podiatry appointment scheduled. Oral antifungal treatment discussed, requires liver function monitoring. - Attend podiatry appointment on June 6 for evaluation and potential toenail removal. - Consider oral antifungal treatment if recommended by podiatry. - Monitor liver function tests if oral antifungal is initiated.  General Health Maintenance PSA test results normal. Explained significance of PSA values in monitoring prostate health during testosterone  therapy. - Continue monitoring PSA levels during testosterone  therapy.      Meds ordered this encounter  Medications   Semaglutide-Weight Management 0.5 MG/0.5ML SOAJ    Sig: Inject 0.5 mg into the skin once a week.    Dispense:  2 mL    Refill:  3    Supervising Provider:   THOMPSON, AARON B [[id number]]   ALPRAZolam  (XANAX ) 0.5 MG tablet    Sig: Take 1 tablet (0.5 mg total) by mouth daily as needed for anxiety.    Dispense:  5 tablet    Refill:  0    Supervising Provider:   THOMPSON, AARON B [[id number]]   amphetamine -dextroamphetamine  (ADDERALL) 20 MG tablet    Sig: Take 1 tablet (20 mg total) by mouth 2 (two) times daily.    Dispense:  60 tablet    Refill:  0    Supervising Provider:   THOMPSON, AARON B [[id number]]   QUEtiapine  (SEROQUEL ) 25 MG tablet    Sig: TAKE 1 TABLET BY MOUTH EVERY NIGHT. MAY  INCREASE TO 2 TABLETS BY MOUTH EVERY NIGHT AFTER A WEEK IF INSUFFICIENT EFFICACY    Dispense:  90 tablet    Refill:  3    Supervising Provider:   THOMPSON, AARON B [[id number]]   Orders Placed This Encounter  Procedures   Testosterone    Comp Met (CMET)   Lab Orders         Testosterone   Comp Met (CMET)     No images are attached to the encounter or orders placed in the encounter.  Return in about 6 months (around 07/27/2024) for Chronic Condition follow up.   Gavin Kast, FNP

## 2024-01-28 ENCOUNTER — Encounter: Payer: Self-pay | Admitting: Podiatry

## 2024-01-28 ENCOUNTER — Telehealth: Payer: Self-pay | Admitting: *Deleted

## 2024-01-28 ENCOUNTER — Ambulatory Visit

## 2024-01-28 ENCOUNTER — Ambulatory Visit: Admitting: Podiatry

## 2024-01-28 ENCOUNTER — Other Ambulatory Visit: Payer: Self-pay | Admitting: Podiatry

## 2024-01-28 DIAGNOSIS — G5761 Lesion of plantar nerve, right lower limb: Secondary | ICD-10-CM

## 2024-01-28 DIAGNOSIS — L603 Nail dystrophy: Secondary | ICD-10-CM | POA: Diagnosis not present

## 2024-01-28 DIAGNOSIS — M216X1 Other acquired deformities of right foot: Secondary | ICD-10-CM

## 2024-01-28 DIAGNOSIS — M2142 Flat foot [pes planus] (acquired), left foot: Secondary | ICD-10-CM

## 2024-01-28 DIAGNOSIS — L600 Ingrowing nail: Secondary | ICD-10-CM | POA: Diagnosis not present

## 2024-01-28 DIAGNOSIS — M2141 Flat foot [pes planus] (acquired), right foot: Secondary | ICD-10-CM | POA: Diagnosis not present

## 2024-01-28 DIAGNOSIS — M216X2 Other acquired deformities of left foot: Secondary | ICD-10-CM

## 2024-01-28 DIAGNOSIS — M79671 Pain in right foot: Secondary | ICD-10-CM

## 2024-01-28 NOTE — Telephone Encounter (Signed)
 I called the patient regarding his appointment.  He said he is having some nerve issues and has has several surgeries on it before.  He wants to address the pain he is having.  I asked him to come a few minutes early because we're going to need xrays of his feet.  I asked him to check in with us  and then we will direct him to imaging.

## 2024-01-28 NOTE — Patient Instructions (Signed)

## 2024-01-28 NOTE — Progress Notes (Signed)
 Subjective:  Patient ID: Philip Cuevas, male    DOB: [birth year],   MRN: [id number]  Chief Complaint  Patient presents with   Nail Problem    "I have a fungal infection on my right foot." N - nail fungus L - hallux right D - 2 mos O - gradually worse C - thick, ingrown, discolored A - none T - none   Foot Pain    "I had several surgeries on my right foot.  Since then, I've had different issues with it." N - foot pain L - plantar, dorsal right D -  O - since surgery C - numbness, flat A - walking, standing T - try to buy nice shoes - [place],     43 y.o. male presents for multiple concerns as above. Has had lisfranc injury and surgery for this.  . Denies any other pedal complaints. Denies n/v/f/c.   Past Medical History:  Diagnosis Date   Alcohol use disorder 07/14/2022   H/o DT 06/2022     Alcohol withdrawal delirium (HCC) 07/07/2022   Anxiety    Asthma    mild, intermittent   Depression    Hypertension    Knee injury    Narcotic withdrawal (HCC) 07/12/2022   Opioid use disorder 07/06/2022   Polysubstance abuse (HCC) 07/10/2022   Tobacco use disorder 06/04/2006   Qualifier: Diagnosis of   By: Allene Ards        Objective:  Physical Exam: Vascular: DP/PT pulses 2/4 bilateral. CFT <3 seconds. Normal hair growth on digits. No edema.  Skin. No lacerations or abrasions bilateral feet. Right hallux nail thickened and dystrophic and painful to palpation.  Musculoskeletal: MMT 5/5 bilateral lower extremities in DF, PF, Inversion and Eversion. Deceased ROM in DF of ankle joint. Right foot with midfoot collapse. Numb to plantar foot. Pes planus noted.  Neurological: Sensation intact to light touch.   Assessment:   1. Onychodystrophy   2. Ingrown right greater toenail   3. Acquired pes planus, right      Plan:  Patient was evaluated and treated and all questions answered. -Xrays reviewed No acute fractures or dislocations noted. Pes planus and collapse of the  midfoot. No degenerative changes noted. Previous metatarsal fractures healed and holes from previous hardware removal noted from lisfranc injury.  -Discussed treatement options; discussed pes planus deformity and history of lisfranc injury and possiblity for future arthritis. ;conservative and  surgical  -Rx Orthotics fitted today  -Recommend good supportive shoes -Recommend daily stretching and icing Discussed ingrown toenails and onychodystrophy etiology and treatment options including procedure for removal vs conservative care.  Patient requesting removal ofnail today. Procedure below.  Discussed procedure and post procedure care and patient expressed understanding.  Will follow-up in 2 weeks for nail check or sooner if any problems arise.    Procedure:  Procedure: partial Nail Avulsion of right hallux nail  Surgeon: Jennefer Moats, DPM  Pre-op Dx: Ingrown toenail without infection Post-op: Same  Place of Surgery: Office exam room.  Indications for surgery: Painful and ingrown toenail.    The patient is requesting removal of nail with  chemical matrixectomy. Risks and complications were discussed with the patient for which they understand and written consent was obtained. Under sterile conditions a total of 3 mL of  1% lidocaine  plain was infiltrated in a hallux block fashion. Once anesthetized, the skin was prepped in sterile fashion. A tourniquet was then applied. Next the entire hallux nail was removed.  Next phenol  was then applied under standard conditions to permanently destroy the matrix and copiously irrigated. Silvadene was applied. A dry sterile dressing was applied. After application of the dressing the tourniquet was removed and there is found to be an immediate capillary refill time to the digit. The patient tolerated the procedure well without any complications. Post procedure instructions were discussed the patient for which he verbally understood. Follow-up in two weeks for  nail check or sooner if any problems are to arise. Discussed signs/symptoms of infection and directed to call the office immediately should any occur or go directly to the emergency room. In the meantime, encouraged to call the office with any questions, concerns, changes symptoms.   Jennefer Moats, DPM

## 2024-02-08 ENCOUNTER — Other Ambulatory Visit (HOSPITAL_COMMUNITY): Payer: Self-pay

## 2024-02-08 ENCOUNTER — Telehealth: Payer: Self-pay

## 2024-02-08 NOTE — Telephone Encounter (Signed)
 Pharmacy Patient Advocate Encounter  Received notification from [facility] that Prior Authorization for Wegovy  0.5 has been DENIED.  Full denial letter will be uploaded to the media tab. See denial reason below.   PA #/Case ID/Reference #: [id number]

## 2024-02-08 NOTE — Telephone Encounter (Signed)
 Pharmacy Patient Advocate Encounter   Received notification from CoverMyMeds that prior authorization for Wegovy  0.5 is required/requested.   Insurance verification completed.   The patient is insured through [facility] .   Per test claim: PA required; PA submitted to above mentioned insurance via CoverMyMeds Key/confirmation #/EOC [id number] Status is pending

## 2024-02-09 ENCOUNTER — Other Ambulatory Visit (HOSPITAL_COMMUNITY): Payer: Self-pay

## 2024-02-11 ENCOUNTER — Ambulatory Visit (INDEPENDENT_AMBULATORY_CARE_PROVIDER_SITE_OTHER): Admitting: Podiatry

## 2024-02-11 ENCOUNTER — Encounter: Payer: Self-pay | Admitting: Internal Medicine

## 2024-02-11 DIAGNOSIS — F419 Anxiety disorder, unspecified: Secondary | ICD-10-CM

## 2024-02-11 DIAGNOSIS — E669 Obesity, unspecified: Secondary | ICD-10-CM

## 2024-02-11 DIAGNOSIS — E291 Testicular hypofunction: Secondary | ICD-10-CM

## 2024-02-11 DIAGNOSIS — F909 Attention-deficit hyperactivity disorder, unspecified type: Secondary | ICD-10-CM

## 2024-02-11 DIAGNOSIS — Z91199 Patient's noncompliance with other medical treatment and regimen due to unspecified reason: Secondary | ICD-10-CM

## 2024-02-11 NOTE — Progress Notes (Signed)
 No show

## 2024-02-13 ENCOUNTER — Other Ambulatory Visit: Payer: Self-pay | Admitting: Internal Medicine

## 2024-02-13 DIAGNOSIS — I10 Essential (primary) hypertension: Secondary | ICD-10-CM

## 2024-02-15 ENCOUNTER — Telehealth: Payer: Self-pay

## 2024-02-15 ENCOUNTER — Other Ambulatory Visit (HOSPITAL_COMMUNITY): Payer: Self-pay

## 2024-02-15 NOTE — Telephone Encounter (Signed)
 Pharmacy Patient Advocate Encounter  Received notification from [facility] that Prior Authorization for Testosterone  Cypionate 100MG /ML intramuscular solution  has been APPROVED from 02/15/24 to 02/14/25   PA #/Case ID/Reference #: [id number]

## 2024-02-15 NOTE — Telephone Encounter (Signed)
 Pharmacy Patient Advocate Encounter   Received notification from [employer] that prior authorization for Testosterone  Cypionate 100MG /ML intramuscular solution is required/requested.   Insurance verification completed.   The patient is insured through [facility] .   Per test claim: PA required; PA submitted to above mentioned insurance via CoverMyMeds Key/confirmation #/EOC [id number] Status is pending

## 2024-02-16 ENCOUNTER — Other Ambulatory Visit (HOSPITAL_COMMUNITY): Payer: Self-pay

## 2024-02-16 ENCOUNTER — Other Ambulatory Visit: Payer: Self-pay

## 2024-02-17 ENCOUNTER — Encounter: Payer: Self-pay | Admitting: Podiatry

## 2024-02-17 ENCOUNTER — Ambulatory Visit (INDEPENDENT_AMBULATORY_CARE_PROVIDER_SITE_OTHER): Admitting: Podiatry

## 2024-02-17 DIAGNOSIS — L600 Ingrowing nail: Secondary | ICD-10-CM | POA: Diagnosis not present

## 2024-02-17 DIAGNOSIS — M2141 Flat foot [pes planus] (acquired), right foot: Secondary | ICD-10-CM

## 2024-02-17 NOTE — Progress Notes (Signed)
  Subjective:  Patient ID: Philip Cuevas, male    DOB: June 27, 1981,   MRN: [id number]  Chief Complaint  Patient presents with   Ingrown Toenail    It's fine, everything is good.    43 y.o. male presents for follow-up of right hallux nail avulsion. Here also for follow-up of right foot pain with previously lisfranc injury and neuritis present and pes planus. Relates doing ok and still waiting on orthotics. Does have a spot he forgot to mention before on his left heel that has been painful.   Denies any other pedal complaints. Denies n/v/f/c.   Past Medical History:  Diagnosis Date   Alcohol use disorder 07/14/2022   H/o DT 06/2022     Alcohol withdrawal delirium (HCC) 07/07/2022   Anxiety    Asthma    mild, intermittent   Depression    Hypertension    Knee injury    Narcotic withdrawal (HCC) 07/12/2022   Opioid use disorder 07/06/2022   Polysubstance abuse (HCC) 07/10/2022   Tobacco use disorder 06/04/2006   Qualifier: Diagnosis of   By: Waylan ROSALEA Pao        Objective:  Physical Exam: Vascular: DP/PT pulses 2/4 bilateral. CFT <3 seconds. Normal hair growth on digits. No edema.  Skin. No lacerations or abrasions bilateral feet. Right hallux nail healing well. Hypekeratotic cored lesion noted to plantar left heel.  Musculoskeletal: MMT 5/5 bilateral lower extremities in DF, PF, Inversion and Eversion. Deceased ROM in DF of ankle joint. Right foot with midfoot collapse. Numb to plantar foot. Pes planus noted.  Neurological: Sensation intact to light touch.   Assessment:   1. Ingrown right greater toenail   2. Acquired pes planus, right       Plan:  Patient was evaluated and treated and all questions answered. -Xrays reviewed No acute fractures or dislocations noted. Pes planus and collapse of the midfoot. No degenerative changes noted. Previous metatarsal fractures healed and holes from previous hardware removal noted from lisfranc injury.  -Discussed treatement  options; discussed pes planus deformity and history of lisfranc injury and possiblity for future arthritis. ;conservative and  surgical  Awaiting orthotics.  Hyperkeratotic lesion debrided without incident as courtesy.  -Recommend good supportive shoes -Recommend daily stretching and icing Toe was evaluated and appears to be healing well.  May discontinue soaks and neosporin.  Patient to follow-up as needed.     Asberry Failing, DPM

## 2024-02-22 MED ORDER — AMPHETAMINE-DEXTROAMPHETAMINE 20 MG PO TABS: 20.0000 mg | ORAL_TABLET | Freq: Two times a day (BID) | ORAL | 0 refills | Status: DC

## 2024-02-22 MED ORDER — ZEPBOUND 5 MG/0.5ML ~~LOC~~ SOAJ: 5.0000 mg | SUBCUTANEOUS | 2 refills | Status: DC

## 2024-02-22 MED ORDER — TESTOSTERONE CYPIONATE 200 MG/ML IM SOLN: 100.0000 mg | INTRAMUSCULAR | 0 refills | Status: DC

## 2024-02-22 MED ORDER — ALPRAZOLAM 0.5 MG PO TABS
0.5000 mg | ORAL_TABLET | Freq: Every day | ORAL | 0 refills | Status: DC | PRN
Start: 2024-02-22 — End: 2024-03-31

## 2024-03-02 NOTE — Progress Notes (Signed)
  Orthotic order placed will schedule for fitting when in

## 2024-03-03 ENCOUNTER — Encounter: Payer: Self-pay | Admitting: Podiatry

## 2024-03-13 ENCOUNTER — Encounter: Payer: Self-pay | Admitting: Urology

## 2024-03-13 ENCOUNTER — Ambulatory Visit (INDEPENDENT_AMBULATORY_CARE_PROVIDER_SITE_OTHER): Admitting: Urology

## 2024-03-13 VITALS — BP 123/84 | HR 75 | Ht 71.0 in | Wt 181.0 lb

## 2024-03-13 DIAGNOSIS — E291 Testicular hypofunction: Secondary | ICD-10-CM

## 2024-03-13 MED ORDER — XYOSTED 75 MG/0.5ML ~~LOC~~ SOAJ: 75.0000 mg | SUBCUTANEOUS | 5 refills | Status: AC

## 2024-03-13 NOTE — Progress Notes (Signed)
 Assessment: 1. Male hypogonadism     Plan: I personally reviewed the patient's chart including provider notes, lab results. I discussed options for management of low testosterone  including short acting injections, long-acting injections, topical therapy, oral therapy, and subcutaneous pellets.  Potential side effects of testosterone  therapy discussed.  Need for close monitoring with periodic laboratory testing reviewed. He is primarily interested in subcutaneous injections or subcutaneous pellets. Prescription for Xyosted  75 mg weekly provided. Labs today:  testosterone , CBC, prolactin  Chief Complaint:  Chief Complaint  Patient presents with   Hypogonadism    History of Present Illness:  Philip Cuevas is a 43 y.o. male who is seen in consultation from Billy Knee, FNP for evaluation of hypogonadism. He was diagnosed with a low testosterone  and October 2022.  His testosterone  level at that time was 75. He reported symptoms of fatigue, depression, decreased libido, and erectile dysfunction. He has a history of opioid addiction. He was started on testosterone  injections at that time but did not use these on a regular basis. He was apparently restarted on testosterone  injections approximately 8 months ago. He is currently on testosterone  injections 100 mg every week.  His last injection was on 03/09/2024. He has noted improvement in his symptoms with the testosterone  injections. He is using daily tadalafil  for management of his ED with good results.  Labs from 6/25: Testosterone  908.4   PSA 2/25:  0.5  H&H 2/25:  16.1/46.6  No history of urinary tract symptoms other than occasional postvoid dribbling.  No dysuria or gross hematuria. IPSS = 4/1.   Past Medical History:  Past Medical History:  Diagnosis Date   Alcohol use disorder 07/14/2022   H/o DT 06/2022     Alcohol withdrawal delirium (HCC) 07/07/2022   Anxiety    Asthma    mild, intermittent   Depression     Hypertension    Knee injury    Narcotic withdrawal (HCC) 07/12/2022   Opioid use disorder 07/06/2022   Polysubstance abuse (HCC) 07/10/2022   Tobacco use disorder 06/04/2006   Qualifier: Diagnosis of   By: Waylan ROSALEA Pao        Past Surgical History:  Past Surgical History:  Procedure Laterality Date   DENTAL SURGERY     PILONIDAL CYST EXCISION N/A 04/12/2015   Procedure: CYST EXCISION PILONIDAL SIMPLE;  Surgeon: Oneil Budge, MD;  Location: [facility];  Service: General;  Laterality: N/A;   TONSILECTOMY, ADENOIDECTOMY, BILATERAL MYRINGOTOMY AND TUBES      Allergies:  No Known Allergies  Family History:  Family History  Problem Relation Age of Onset   Hypertension Mother    Hyperlipidemia Mother    Colon cancer Mother 7   Depression Father        committed suicide    Social History:  Social History   Tobacco Use   Smoking status: Every Day    Current packs/day: 0.00    Average packs/day: 0.3 packs/day for 4.0 years (1.2 ttl pk-yrs)    Types: E-cigarettes, Cigarettes    Start date: 10/22/2001    Last attempt to quit: 10/22/2005    Years since quitting: 18.4   Smokeless tobacco: Never   Tobacco comments:    I use the pouch things - 01/28/2024 DJM  Substance Use Topics   Alcohol use: Not Currently    Comment: 2 drinks a day   Drug use: Not Currently    Comment: snorts heroin    Review of symptoms:  Constitutional:  Negative for  unexplained weight loss, night sweats, fever, chills ENT:  Negative for nose bleeds, sinus pain, painful swallowing CV:  Negative for chest pain, shortness of breath, exercise intolerance, palpitations, loss of consciousness Resp:  Negative for cough, wheezing, shortness of breath GI:  Negative for nausea, vomiting, diarrhea, bloody stools GU:  Positives noted in HPI; otherwise negative for gross hematuria, dysuria, urinary incontinence Neuro:  Negative for seizures, poor balance, limb weakness, slurred speech Psych:  Negative for lack of  energy, depression, anxiety Endocrine:  Negative for polydipsia, polyuria, symptoms of hypoglycemia (dizziness, hunger, sweating) Hematologic:  Negative for anemia, purpura, petechia, prolonged or excessive bleeding, use of anticoagulants  Allergic:  Negative for difficulty breathing or choking as a result of exposure to anything; no shellfish allergy; no allergic response (rash/itch) to materials, foods  Physical exam: BP 123/84   Pulse 75   Ht 5' 11 (1.803 m)   Wt 181 lb (82.1 kg)   BMI 25.24 kg/m  GENERAL APPEARANCE:  Well appearing, well developed, well nourished, NAD HEENT: Atraumatic, Normocephalic, oropharynx clear. NECK: Supple without lymphadenopathy or thyromegaly. LUNGS: Clear to auscultation bilaterally. HEART: Regular Rate and Rhythm without murmurs, gallops, or rubs. ABDOMEN: Soft, non-tender, No Masses. EXTREMITIES: Moves all extremities well.  Without clubbing, cyanosis, or edema. NEUROLOGIC:  Alert and oriented x 3, normal gait, CN II-XII grossly intact.  MENTAL STATUS:  Appropriate. BACK:  Non-tender to palpation.  No CVAT SKIN:  Warm, dry and intact.   GU: Penis:  circumcised Meatus: Normal Scrotum: Normal  Testis: normal without masses bilateral   Results: None

## 2024-03-14 ENCOUNTER — Ambulatory Visit: Payer: Self-pay | Admitting: Urology

## 2024-03-14 LAB — CBC
Hematocrit: 45.2 % (ref 37.5–51.0)
Hemoglobin: 15.8 g/dL (ref 13.0–17.7)
MCH: 30.7 pg (ref 26.6–33.0)
MCHC: 35 g/dL (ref 31.5–35.7)
MCV: 88 fL (ref 79–97)
Platelets: 190 x10E3/uL (ref 150–450)
RBC: 5.15 x10E6/uL (ref 4.14–5.80)
RDW: 12.3 % (ref 11.6–15.4)
WBC: 4.6 x10E3/uL (ref 3.4–10.8)

## 2024-03-14 LAB — PROLACTIN: Prolactin: 6.1 ng/mL (ref 3.9–22.7)

## 2024-03-14 LAB — TESTOSTERONE: Testosterone: 221 ng/dL — ABNORMAL LOW (ref 264–916)

## 2024-03-16 ENCOUNTER — Telehealth: Payer: Self-pay | Admitting: Podiatry

## 2024-03-16 NOTE — Telephone Encounter (Signed)
 Called to schedule orthotic fitting. I let him know that his balance on them is $542.00. he said his insurance should have covered them. I gave him the diagnosis code and cpt code to call his insurance if he wants to double check. Pt will call back to schedule.SABRA

## 2024-03-17 ENCOUNTER — Telehealth: Payer: Self-pay

## 2024-03-17 NOTE — Telephone Encounter (Signed)
 Completed pt PA for Xyosted  today. I had to complete via telephone and then fax in chart notes. PA dept made nurse aware that it could take several days to get a response. Nurse spoke with pt in reference to PA request. Pt stated he was due for an injection yesterday. Pt is requesting a rx for testosterone  cypionate until Xyosted  is approved. Pt states his s/s get much worse when being completely off of any testosterone  replacement therapy. Please advise.

## 2024-03-18 ENCOUNTER — Other Ambulatory Visit: Payer: Self-pay | Admitting: Urology

## 2024-03-18 DIAGNOSIS — E291 Testicular hypofunction: Secondary | ICD-10-CM

## 2024-03-18 MED ORDER — TESTOSTERONE CYPIONATE 200 MG/ML IM SOLN
100.0000 mg | INTRAMUSCULAR | 0 refills | Status: AC
Start: 2024-03-18 — End: ?

## 2024-03-20 ENCOUNTER — Encounter: Payer: Self-pay | Admitting: Internal Medicine

## 2024-03-20 DIAGNOSIS — F909 Attention-deficit hyperactivity disorder, unspecified type: Secondary | ICD-10-CM

## 2024-03-20 NOTE — Telephone Encounter (Signed)
 Orthotics in [facility] bin with Dr. Sikora

## 2024-03-21 ENCOUNTER — Other Ambulatory Visit: Payer: Self-pay | Admitting: Internal Medicine

## 2024-03-21 DIAGNOSIS — F909 Attention-deficit hyperactivity disorder, unspecified type: Secondary | ICD-10-CM

## 2024-03-21 MED ORDER — AMPHETAMINE-DEXTROAMPHETAMINE 20 MG PO TABS: 20.0000 mg | ORAL_TABLET | Freq: Two times a day (BID) | ORAL | 0 refills | Status: DC

## 2024-03-21 NOTE — Telephone Encounter (Signed)
 Re fill sent on 03/21/2024

## 2024-03-21 NOTE — Telephone Encounter (Signed)
 Copied from CRM [id number]. Topic: Clinical - Medication Refill >> Mar 21, 2024  1:50 PM Paige D wrote: Medication: amphetamine -dextroamphetamine  (ADDERALL) 20 MG tablet  Has the patient contacted their pharmacy? Yes (Agent: If no, request that the patient contact the pharmacy for the refill. If patient does not wish to contact the pharmacy document the reason why and proceed with request.) (Agent: If yes, when and what did the pharmacy advise?)  This is the patient's preferred pharmacy:  [id number] - [place], [place] - [address] [place] [address] Phone: [phone] Fax: [phone]  Is this the correct pharmacy for this prescription? Yes If no, delete pharmacy and type the correct one.   Has the prescription been filled recently? Yes  Is the patient out of the medication? Yes  Has the patient been seen for an appointment in the last year OR does the patient have an upcoming appointment? Yes  Can we respond through MyChart? Yes  Agent: Please be advised that Rx refills may take up to 3 business days. We ask that you follow-up with your pharmacy.

## 2024-03-28 ENCOUNTER — Ambulatory Visit: Payer: Managed Care, Other (non HMO) | Admitting: Internal Medicine

## 2024-03-30 ENCOUNTER — Other Ambulatory Visit: Payer: Self-pay | Admitting: Internal Medicine

## 2024-03-30 DIAGNOSIS — F419 Anxiety disorder, unspecified: Secondary | ICD-10-CM

## 2024-04-11 ENCOUNTER — Other Ambulatory Visit: Payer: Self-pay

## 2024-04-11 DIAGNOSIS — E291 Testicular hypofunction: Secondary | ICD-10-CM

## 2024-04-20 ENCOUNTER — Encounter: Payer: Self-pay | Admitting: Internal Medicine

## 2024-04-20 DIAGNOSIS — F909 Attention-deficit hyperactivity disorder, unspecified type: Secondary | ICD-10-CM

## 2024-04-21 ENCOUNTER — Other Ambulatory Visit: Payer: Self-pay | Admitting: Internal Medicine

## 2024-04-21 MED ORDER — AMPHETAMINE-DEXTROAMPHETAMINE 20 MG PO TABS: 20.0000 mg | ORAL_TABLET | Freq: Two times a day (BID) | ORAL | 0 refills | Status: DC

## 2024-04-25 ENCOUNTER — Encounter: Payer: Self-pay | Admitting: Sports Medicine

## 2024-04-27 ENCOUNTER — Other Ambulatory Visit

## 2024-04-28 ENCOUNTER — Other Ambulatory Visit: Payer: Self-pay | Admitting: Internal Medicine

## 2024-04-28 DIAGNOSIS — N529 Male erectile dysfunction, unspecified: Secondary | ICD-10-CM

## 2024-05-01 ENCOUNTER — Other Ambulatory Visit

## 2024-05-01 ENCOUNTER — Other Ambulatory Visit: Payer: Self-pay

## 2024-05-01 DIAGNOSIS — E291 Testicular hypofunction: Secondary | ICD-10-CM

## 2024-05-05 ENCOUNTER — Other Ambulatory Visit

## 2024-05-05 DIAGNOSIS — E291 Testicular hypofunction: Secondary | ICD-10-CM

## 2024-05-06 LAB — TESTOSTERONE: Testosterone: 414 ng/dL (ref 264–916)

## 2024-05-08 ENCOUNTER — Ambulatory Visit: Payer: Self-pay | Admitting: Urology

## 2024-05-15 ENCOUNTER — Ambulatory Visit: Admitting: Urology

## 2024-05-17 ENCOUNTER — Encounter: Payer: Self-pay | Admitting: Internal Medicine

## 2024-05-17 DIAGNOSIS — F419 Anxiety disorder, unspecified: Secondary | ICD-10-CM

## 2024-05-17 DIAGNOSIS — F909 Attention-deficit hyperactivity disorder, unspecified type: Secondary | ICD-10-CM

## 2024-05-17 MED ORDER — AMPHETAMINE-DEXTROAMPHETAMINE 20 MG PO TABS
20.0000 mg | ORAL_TABLET | Freq: Two times a day (BID) | ORAL | 0 refills | Status: DC
Start: 2024-05-18 — End: 2024-06-14

## 2024-05-17 MED ORDER — ALPRAZOLAM 0.5 MG PO TABS: 0.5000 mg | ORAL_TABLET | Freq: Every day | ORAL | 0 refills | Status: DC | PRN

## 2024-06-14 ENCOUNTER — Encounter: Payer: Self-pay | Admitting: Internal Medicine

## 2024-06-14 ENCOUNTER — Other Ambulatory Visit: Payer: Self-pay

## 2024-06-14 DIAGNOSIS — F909 Attention-deficit hyperactivity disorder, unspecified type: Secondary | ICD-10-CM

## 2024-06-14 NOTE — Telephone Encounter (Signed)
 Refill request for amphetamine -dextroamphetamine  (ADDERALL) 20 MG tablet   LOV 01/26/24 FOV 07/28/24 LRF 05/18/24

## 2024-06-15 MED ORDER — AMPHETAMINE-DEXTROAMPHETAMINE 20 MG PO TABS: 20.0000 mg | ORAL_TABLET | Freq: Two times a day (BID) | ORAL | 0 refills | Status: DC

## 2024-07-17 ENCOUNTER — Encounter: Payer: Self-pay | Admitting: Internal Medicine

## 2024-07-17 DIAGNOSIS — F419 Anxiety disorder, unspecified: Secondary | ICD-10-CM

## 2024-07-17 DIAGNOSIS — F909 Attention-deficit hyperactivity disorder, unspecified type: Secondary | ICD-10-CM

## 2024-07-17 NOTE — Telephone Encounter (Signed)
 Pt requesting refill for ALPRAZolam  (XANAX ) 0.5 MG tablet    LOV 01/26/24 LRF 05/17/24 FOV 07/28/24   amphetamine -dextroamphetamine  (ADDERALL) 20 MG tablet  LOV 06/15/24 FOV 07/28/24 LRF 06/15/24

## 2024-07-18 ENCOUNTER — Other Ambulatory Visit: Payer: Self-pay

## 2024-07-18 DIAGNOSIS — F909 Attention-deficit hyperactivity disorder, unspecified type: Secondary | ICD-10-CM

## 2024-07-18 DIAGNOSIS — F419 Anxiety disorder, unspecified: Secondary | ICD-10-CM

## 2024-07-18 MED ORDER — ALPRAZOLAM 0.5 MG PO TABS: 0.5000 mg | ORAL_TABLET | Freq: Every day | ORAL | 0 refills | Status: DC | PRN

## 2024-07-18 MED ORDER — AMPHETAMINE-DEXTROAMPHETAMINE 20 MG PO TABS: 20.0000 mg | ORAL_TABLET | Freq: Two times a day (BID) | ORAL | 0 refills | Status: AC

## 2024-07-18 NOTE — Telephone Encounter (Signed)
 Refill request for  Adderall   LR 06/15/24, #60, 0 rf  Alprazolam   LR  05/17/24, #5, 0 rf LOV  01/26/24 FOV  07/28/24  Please review and advise.  Thanks. Dm/cma

## 2024-07-18 NOTE — Telephone Encounter (Signed)
 Copied from CRM #[id number]. Topic: Clinical - Medication Question >> Jul 18, 2024 11:00 AM Carlyon D wrote: Reason for CRM: Pt is calling in regards to medications ALPRAZolam  (XANAX ) 0.5 MG tablet and amphetamine -dextroamphetamine  (ADDERALL) 20 MG tablet. Pt is wanting to know if these medications can be sent before tomorrow as its the holidays . Advised pt scripts were pending Please call pt with any further questions

## 2024-07-25 ENCOUNTER — Ambulatory Visit: Admitting: Urology

## 2024-07-25 NOTE — Progress Notes (Deleted)
 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   History of Present Illness:  Philip Cuevas is a 43 y.o. male who is seen for further evaluation of hypogonadism. He was diagnosed with a low testosterone  and October 2022.  His testosterone  level at that time was 75. He reported symptoms of fatigue, depression, decreased libido, and erectile dysfunction. He has a history of opioid addiction. He was started on testosterone  injections at that time but did not use these on a regular basis. He was apparently restarted on testosterone  injections approximately 8 months ago. He was on testosterone  injections 100 mg every week.  His last injection was on 03/09/2024. He noted improvement in his symptoms with the testosterone  injections. He was using daily tadalafil  for management of his ED with good results.  Labs from 6/25: Testosterone  908.4   PSA 2/25:  0.5  H&H 2/25:  16.1/46.6  Labs from 03/13/24:   Testosterone  221 H&H  15.8/45.2 Prolactin 6.1  He was started on Xyosted  75 mg weekly in July 2025. Testosterone  from 9/25: 414 (trough level)  No history of urinary tract symptoms other than occasional postvoid dribbling.  No dysuria or gross hematuria. IPSS = 4/1.   Past Medical History:  Past Medical History:  Diagnosis Date   Alcohol use disorder 07/14/2022   H/o DT 06/2022     Alcohol withdrawal delirium (HCC) 07/07/2022   Anxiety    Asthma    mild, intermittent   Depression    Hypertension    Knee injury    Narcotic withdrawal (HCC) 07/12/2022   Opioid use disorder 07/06/2022   Polysubstance abuse (HCC) 07/10/2022   Tobacco use disorder 06/04/2006   Qualifier: Diagnosis of   By: Waylan ROSALEA Pao        Past Surgical History:  Past Surgical History:  Procedure Laterality Date   DENTAL SURGERY      PILONIDAL CYST EXCISION N/A 04/12/2015   Procedure: CYST EXCISION PILONIDAL SIMPLE;  Surgeon: Oneil Budge, MD;  Location: [facility];  Service: General;  Laterality: N/A;   TONSILECTOMY, ADENOIDECTOMY, BILATERAL MYRINGOTOMY AND TUBES      Allergies:  No Known Allergies  Family History:  Family History  Problem Relation Age of Onset   Hypertension Mother    Hyperlipidemia Mother    Colon cancer Mother 39   Depression Father        committed suicide    Social History:  Social History   Tobacco Use   Smoking status: Every Day    Current packs/day: 0.00    Average packs/day: 0.3 packs/day for 4.0 years (1.2 ttl pk-yrs)    Types: E-cigarettes, Cigarettes    Start date: 10/22/2001    Last attempt to quit: 10/22/2005    Years since quitting: 18.7   Smokeless tobacco: Never   Tobacco comments:    I use the pouch things - 01/28/2024 DJM  Substance Use Topics   Alcohol use: Not Currently    Comment: 2 drinks a day   Drug use: Not Currently    Comment: snorts heroin    ROS: Constitutional:  Negative for fever, chills, weight loss CV: Negative for chest pain, previous MI, hypertension Respiratory:  Negative for shortness of breath, wheezing, sleep apnea, frequent cough GI:  Negative for nausea, vomiting, bloody stool, GERD  Physical exam: There were  no vitals taken for this visit. GENERAL APPEARANCE:  Well appearing, well developed, well nourished, NAD HEENT:  Atraumatic, normocephalic, oropharynx clear NECK:  Supple without lymphadenopathy or thyromegaly ABDOMEN:  Soft, non-tender, no masses EXTREMITIES:  Moves all extremities well, without clubbing, cyanosis, or edema NEUROLOGIC:  Alert and oriented x 3, normal gait, CN II-XII grossly intact MENTAL STATUS:  appropriate BACK:  Non-tender to palpation, No CVAT SKIN:  Warm, dry, and intact   Results: None

## 2024-07-28 ENCOUNTER — Ambulatory Visit: Admitting: Internal Medicine

## 2024-08-04 ENCOUNTER — Other Ambulatory Visit: Payer: Self-pay | Admitting: Internal Medicine

## 2024-08-04 DIAGNOSIS — F5101 Primary insomnia: Secondary | ICD-10-CM

## 2024-08-08 ENCOUNTER — Other Ambulatory Visit: Payer: Self-pay | Admitting: Internal Medicine

## 2024-08-08 DIAGNOSIS — F5101 Primary insomnia: Secondary | ICD-10-CM

## 2024-08-09 ENCOUNTER — Other Ambulatory Visit: Payer: Self-pay

## 2024-08-09 ENCOUNTER — Inpatient Hospital Stay: Admission: RE | Admit: 2024-08-09 | Discharge: 2024-08-09 | Attending: Nurse Practitioner

## 2024-08-09 VITALS — BP 125/83 | HR 90 | Temp 98.0°F | Resp 18 | Ht 71.0 in | Wt 190.0 lb

## 2024-08-09 DIAGNOSIS — B9789 Other viral agents as the cause of diseases classified elsewhere: Secondary | ICD-10-CM | POA: Diagnosis not present

## 2024-08-09 DIAGNOSIS — J019 Acute sinusitis, unspecified: Secondary | ICD-10-CM | POA: Diagnosis not present

## 2024-08-09 DIAGNOSIS — J029 Acute pharyngitis, unspecified: Secondary | ICD-10-CM

## 2024-08-09 LAB — POC COVID19/FLU A&B COMBO
Covid Antigen, POC: NEGATIVE
Influenza A Antigen, POC: NEGATIVE
Influenza B Antigen, POC: NEGATIVE

## 2024-08-09 LAB — POCT RAPID STREP A (OFFICE): Rapid Strep A Screen: NEGATIVE

## 2024-08-09 MED ORDER — CETIRIZINE-PSEUDOEPHEDRINE ER 5-120 MG PO TB12: 1.0000 | ORAL_TABLET | Freq: Every day | ORAL | 0 refills | Status: AC

## 2024-08-09 MED ORDER — PREDNISONE 20 MG PO TABS: 40.0000 mg | ORAL_TABLET | Freq: Every day | ORAL | 0 refills | Status: AC

## 2024-08-09 MED ORDER — FLUTICASONE PROPIONATE 50 MCG/ACT NA SUSP: 2.0000 | Freq: Every day | NASAL | 0 refills | Status: AC

## 2024-08-09 NOTE — ED Provider Notes (Signed)
 [facility]    CSN: [id number] Arrival date & time: 08/09/24  9141      History   Chief Complaint Chief Complaint  Patient presents with   Nasal Congestion   Sore Throat    HPI Philip Cuevas is a 43 y.o. male.   Discussed the use of AI scribe software for clinical note transcription with the patient, who gave verbal consent to proceed.   The patient presents with a four-day history of upper respiratory symptoms, including nasal congestion, fatigue, and sore throat, which he identifies as his most severe symptom. He reports significant sneezing with nasal discharge and post-nasal drainage. Over the past couple of days, he has experienced intermittent nausea and a few headaches. He notes feeling cold but attributes this to the freezing weather and is unsure whether he has had true fevers.  He denies current ear pain or pressure and reports no vomiting or diarrhea. He has a history of smoking but currently vapes.  The following sections of the patient's history were reviewed and updated as appropriate: allergies, current medications, past family history, past medical history, past social history, past surgical history, and problem list.     Past Medical History:  Diagnosis Date   Alcohol use disorder 07/14/2022   H/o DT 06/2022     Alcohol withdrawal delirium (HCC) 07/07/2022   Anxiety    Asthma    mild, intermittent   Depression    Hypertension    Knee injury    Narcotic withdrawal (HCC) 07/12/2022   Opioid use disorder 07/06/2022   Polysubstance abuse (HCC) 07/10/2022   Tobacco use disorder 06/04/2006   Qualifier: Diagnosis of   By: Waylan ROSALEA Pao        Patient Active Problem List   Diagnosis Date Noted   Attention deficit hyperactivity disorder (ADHD) 09/29/2023   Premature ejaculation 06/08/2023   Suspicious nevus 02/04/2023   Avascular necrosis of hip, 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   Tachycardia 07/11/2022   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   History  of migraine headaches 08/29/2018   Venous stasis dermatitis of both lower extremities 12/22/2017   Obstructive sleep apnea 04/30/2017   Hypertriglyceridemia 03/03/2017   Left knee pain with intra-articular loose body 05/02/2012   Essential (primary) hypertension 05/07/2008   Erectile dysfunction 07/06/2007   MDD (major depressive disorder), severe (HCC) 11/24/2006   ASTHMA, EXERCISE INDUCED 11/24/2006   Insomnia 06/04/2006   Tobacco use disorder 06/04/2006   GAD 06/01/2006    Past Surgical History:  Procedure Laterality Date   DENTAL SURGERY     PILONIDAL CYST EXCISION N/A 04/12/2015   Procedure: CYST EXCISION PILONIDAL SIMPLE;  Surgeon: Oneil Budge, MD;  Location: [facility];  Service: General;  Laterality: N/A;   TONSILECTOMY, ADENOIDECTOMY, BILATERAL MYRINGOTOMY AND TUBES         Home Medications    Prior to Admission medications  Medication Sig Start Date End Date Taking? Authorizing Provider  cetirizine -pseudoephedrine  (ZYRTEC -D) 5-120 MG tablet Take 1 tablet by mouth daily with breakfast for 10 days. 08/09/24 08/19/24 Yes Iola Lukes, FNP  fluticasone  (FLONASE ) 50 MCG/ACT nasal spray Place 2 sprays into both nostrils daily. Shake well before use. Gently blow nose before spraying. Do not blow nose immediately after use. You should not taste the medication or feel it going down your throat; if you do, adjust your technique. 08/09/24  Yes Vedanth Sirico, FNP  predniSONE  (DELTASONE ) 20 MG tablet Take 2 tablets (40 mg total) by mouth daily for 5 days. 08/09/24 08/14/24 Yes  Iola Lukes, FNP  ALPRAZolam  (XANAX ) 0.5 MG tablet Take 1 tablet (0.5 mg total) by mouth daily as needed for anxiety. 07/18/24   Billy Knee, FNP  amLODipine  (NORVASC ) 2.5 MG tablet TAKE 1 TABLET(2.5 MG) BY MOUTH DAILY 02/14/24   Billy Knee, FNP  amphetamine -dextroamphetamine  (ADDERALL) 20 MG tablet Take 1 tablet (20 mg total) by mouth 2 (two) times daily. 07/18/24   Billy Knee, FNP  B-D 3CC  LUER-LOK SYR 18GX1-1/2 18G X 1-1/2 3 ML MISC USE EVERY 2 WEEKS FOR TESTOSTERONE  INJECTION 09/05/23   [provider]  NEEDLE, DISP, 22 G (BD DISP NEEDLES) 22G X 1-1/2 MISC USE EVERY 2 WEEKS FOR TESTOSTERONE  INJECTIONS 05/18/23   Billy Knee, FNP  QUEtiapine  (SEROQUEL ) 25 MG tablet TAKE 1 TABLET BY MOUTH EVERY NIGHT. MAY INCREASE TO 2 TABLETS BY MOUTH EVERY NIGHT AFTER A WEEK IF INSUFFICIENT EFFICACY 01/26/24   Billy Knee, FNP  Syringe, Disposable, (EASY GLIDE LUER LOCK SYRINGE) 1 ML MISC 1 each by Does not apply route as needed. 06/08/23   Sebastian Beverley NOVAK, MD  tadalafil  (CIALIS ) 5 MG tablet TAKE 1 TABLET(5 MG) BY MOUTH DAILY 04/28/24   Billy Knee, FNP  testosterone  cypionate (DEPOTESTOSTERONE CYPIONATE) 200 MG/ML injection Inject 0.5 mLs (100 mg total) into the muscle every 14 (fourteen) days. 03/18/24   Stoneking, Adine PARAS., MD  Testosterone  Enanthate (XYOSTED ) 75 MG/0.5ML SOAJ Inject 75 mg into the skin once a week. 03/13/24   Stoneking, Adine PARAS., MD  tirzepatide  (ZEPBOUND ) 5 MG/0.5ML Pen Inject 5 mg into the skin once a week. 02/22/24   Billy Knee, FNP    Family History Family History  Problem Relation Age of Onset   Hypertension Mother    Hyperlipidemia Mother    Colon cancer Mother 7   Depression Father        committed suicide    Social History Social History[1]   Allergies   Patient has no known allergies.   Review of Systems Review of Systems  Constitutional:  Positive for chills and fatigue. Negative for fever.  HENT:  Positive for congestion, postnasal drip, rhinorrhea, sneezing and sore throat. Negative for ear pain.   Respiratory:  Negative for cough (not yet but can feel it coming).   Gastrointestinal:  Positive for nausea. Negative for diarrhea and vomiting.  Musculoskeletal:  Negative for myalgias.  All other systems reviewed and are negative.    Physical Exam Triage Vital Signs ED Triage Vitals  Encounter Vitals Group     BP 08/09/24 0916  125/83     Girls Systolic BP Percentile --      Girls Diastolic BP Percentile --      Boys Systolic BP Percentile --      Boys Diastolic BP Percentile --      Pulse Rate 08/09/24 0916 90     Resp 08/09/24 0916 18     Temp 08/09/24 0916 98 F (36.7 C)     Temp Source 08/09/24 0916 Oral     SpO2 08/09/24 0916 98 %     Weight 08/09/24 0914 190 lb (86.2 kg)     Height 08/09/24 0914 5' 11 (1.803 m)     Head Circumference --      Peak Flow --      Pain Score 08/09/24 0914 4     Pain Loc --      Pain Education --      Exclude from Growth Chart --    No data found.  Updated Vital  Signs BP 125/83 (BP Location: Right Arm)   Pulse 90   Temp 98 F (36.7 C) (Oral)   Resp 18   Ht 5' 11 (1.803 m)   Wt 190 lb (86.2 kg)   SpO2 98%   BMI 26.50 kg/m   Visual Acuity Right Eye Distance:   Left Eye Distance:   Bilateral Distance:    Right Eye Near:   Left Eye Near:    Bilateral Near:     Physical Exam Vitals reviewed.  Constitutional:      General: He is awake. He is not in acute distress.    Appearance: Normal appearance. He is well-developed. He is not ill-appearing, toxic-appearing or diaphoretic.  HENT:     Head: Normocephalic.     Right Ear: Hearing, tympanic membrane, ear canal and external ear normal. No drainage, swelling or tenderness. No middle ear effusion. Tympanic membrane is not erythematous.     Left Ear: Hearing, tympanic membrane, ear canal and external ear normal. No drainage, swelling or tenderness.  No middle ear effusion. Tympanic membrane is not erythematous.     Nose: Congestion present.     Right Sinus: No maxillary sinus tenderness or frontal sinus tenderness.     Left Sinus: No maxillary sinus tenderness or frontal sinus tenderness.     Mouth/Throat:     Lips: Pink.     Mouth: Mucous membranes are moist.     Pharynx: Oropharynx is clear. Uvula midline. No pharyngeal swelling, oropharyngeal exudate, posterior oropharyngeal erythema or uvula swelling.      Tonsils: No tonsillar exudate or tonsillar abscesses.  Eyes:     General: Vision grossly intact.     Conjunctiva/sclera: Conjunctivae normal.  Cardiovascular:     Rate and Rhythm: Normal rate and regular rhythm.     Heart sounds: Normal heart sounds.  Pulmonary:     Effort: Pulmonary effort is normal.     Breath sounds: Normal breath sounds and air entry.  Musculoskeletal:        General: Normal range of motion.     Cervical back: Full passive range of motion without pain, normal range of motion and neck supple.  Lymphadenopathy:     Cervical: No cervical adenopathy.  Skin:    General: Skin is warm and dry.  Neurological:     General: No focal deficit present.     Mental Status: He is alert and oriented to person, place, and time.  Psychiatric:        Mood and Affect: Mood normal.        Behavior: Behavior normal. Behavior is cooperative.      UC Treatments / Results  Labs (all labs ordered are listed, but only abnormal results are displayed) Labs Reviewed  POC COVID19/FLU A&B COMBO  POCT RAPID STREP A (OFFICE)    EKG   Radiology No results found.  Procedures Procedures (including critical care time)  Medications Ordered in UC Medications - No data to display  Initial Impression / Assessment and Plan / UC Course  I have reviewed the triage vital signs and the nursing notes.  Pertinent labs & imaging results that were available during my care of the patient were reviewed by me and considered in my medical decision making (see chart for details).     The patient presents with symptoms consistent with acute viral sinusitis. Flu, COVID and strep testing was negative. Treatment plan includes prescribed medications and supportive care measures such as hydration, rest, and symptomatic relief. The importance  of adherence to prescribed therapy was emphasized. Patient was advised to follow up with their primary care provider as needed and to seek emergency care for  worsening or concerning symptoms.  Today's evaluation has revealed no signs of a dangerous process. Discussed diagnosis with patient and/or guardian. Patient and/or guardian aware of their diagnosis, possible red flag symptoms to watch out for and need for close follow up. Patient and/or guardian understands verbal and written discharge instructions. Patient and/or guardian comfortable with plan and disposition.  Patient and/or guardian has a clear mental status at this time, good insight into illness (after discussion and teaching) and has clear judgment to make decisions regarding their care  Documentation was completed with the aid of voice recognition software. Transcription may contain typographical errors.   Final Clinical Impressions(s) / UC Diagnoses   Final diagnoses:  Acute sore throat  Acute viral sinusitis     Discharge Instructions      You have viral sinus infection. A sinus infection, also called sinusitis, is inflammation of your sinuses. Sinusitis can be due to bacteria, viruses or allergies. Your infection is likely due to virus. Since your illness is caused by a virus, antibiotics wont help because antibiotics only treat infections caused by bacteria. Take medications as prescribed. Make sure you finish all the prescribed medications even if you start to feel better. You may also take tylenol  and/or ibuprofen as needed for pain and/or fever. Drink enough fluid to keep your urine pale yellow. Staying hydrated will also help to thin your mucus. Use a cool mist humidifier to keep the humidity level in your home above 50%. Inhale steam for 10-15 minutes, 3-4 times a day. You can do this in the bathroom while a hot shower is running and/or purchase over-the-counter vapor shower tablets which is great to help with nasal congestion.Try to limit your exposure to cool or dry air. Sleep with your head raised to decrease post-nasal drainage. Make sure you get enough sleep each night .Once  you have finished your medication and your symptoms have improved, remember to replace your toothbrush to help prevent re-infection. If your symptoms do not improved after completing medications, please follow-up with your healthcare provider.       ED Prescriptions     Medication Sig Dispense Auth. Provider   fluticasone  (FLONASE ) 50 MCG/ACT nasal spray Place 2 sprays into both nostrils daily. Shake well before use. Gently blow nose before spraying. Do not blow nose immediately after use. You should not taste the medication or feel it going down your throat; if you do, adjust your technique. 16 g Josette Shimabukuro, FNP   cetirizine -pseudoephedrine  (ZYRTEC -D) 5-120 MG tablet Take 1 tablet by mouth daily with breakfast for 10 days. 10 tablet Iola Lukes, FNP   predniSONE  (DELTASONE ) 20 MG tablet Take 2 tablets (40 mg total) by mouth daily for 5 days. 10 tablet Iola Lukes, FNP      PDMP not reviewed this encounter.     [1]  Social History Tobacco Use   Smoking status: Every Day    Current packs/day: 0.00    Average packs/day: 0.3 packs/day for 4.0 years (1.2 ttl pk-yrs)    Types: E-cigarettes, Cigarettes    Start date: 10/22/2001    Last attempt to quit: 10/22/2005    Years since quitting: 18.8   Smokeless tobacco: Never   Tobacco comments:    I use the pouch things - 01/28/2024 DJM  Vaping Use   Vaping status: Every Day  Substance Use  Topics   Alcohol use: Not Currently    Comment: 2 drinks a day   Drug use: Not Currently    Comment: snorts heroin     Iola Lukes, [place] 08/09/24 1238

## 2024-08-09 NOTE — Discharge Instructions (Addendum)
 You have viral sinus infection. A sinus infection, also called sinusitis, is inflammation of your sinuses. Sinusitis can be due to bacteria, viruses or allergies. Your infection is likely due to virus. Since your illness is caused by a virus, antibiotics wont help because antibiotics only treat infections caused by bacteria. Take medications as prescribed. Make sure you finish all the prescribed medications even if you start to feel better. You may also take tylenol  and/or ibuprofen as needed for pain and/or fever. Drink enough fluid to keep your urine pale yellow. Staying hydrated will also help to thin your mucus. Use a cool mist humidifier to keep the humidity level in your home above 50%. Inhale steam for 10-15 minutes, 3-4 times a day. You can do this in the bathroom while a hot shower is running and/or purchase over-the-counter vapor shower tablets which is great to help with nasal congestion.Try to limit your exposure to cool or dry air. Sleep with your head raised to decrease post-nasal drainage. Make sure you get enough sleep each night .Once you have finished your medication and your symptoms have improved, remember to replace your toothbrush to help prevent re-infection. If your symptoms do not improved after completing medications, please follow-up with your healthcare provider.

## 2024-08-09 NOTE — ED Triage Notes (Signed)
 Pt presents with complaints of nasal congestion, sore throat, and increase in fatigue. Congestion and fatigue has been present for four days. Sore throat became more severe/noticeable yesterday. Currently rates overall pain a 4/10. States his throat is bothering him the most at this time. OTC Dayquil + Sudafed taken for symptoms at home with minimal improvement.

## 2024-08-10 ENCOUNTER — Other Ambulatory Visit: Payer: Self-pay | Admitting: Internal Medicine

## 2024-08-10 DIAGNOSIS — E669 Obesity, unspecified: Secondary | ICD-10-CM

## 2024-08-11 ENCOUNTER — Encounter: Payer: Self-pay | Admitting: Internal Medicine

## 2024-08-11 DIAGNOSIS — F5101 Primary insomnia: Secondary | ICD-10-CM

## 2024-08-11 DIAGNOSIS — F909 Attention-deficit hyperactivity disorder, unspecified type: Secondary | ICD-10-CM

## 2024-08-11 MED ORDER — AMPHETAMINE-DEXTROAMPHETAMINE 20 MG PO TABS: 20.0000 mg | ORAL_TABLET | Freq: Two times a day (BID) | ORAL | 0 refills | Status: DC

## 2024-08-11 MED ORDER — QUETIAPINE FUMARATE 25 MG PO TABS: ORAL_TABLET | ORAL | 3 refills | Status: AC

## 2024-08-11 NOTE — Telephone Encounter (Signed)
 Pt returning phone call but he isn't sure who called

## 2024-08-11 NOTE — Telephone Encounter (Signed)
 Pt is requesting refill on amphetamine -dextroamphetamine  (ADDERALL) 20 MG tablet   LOV 01/26/2024 FOV not schedule  LRF 07/18/24   Patient is  requesting refill on QUEtiapine  (SEROQUEL ) 25 MG tablet [[id number]]   LOV 01/26/2024 FOV not schedule LRF 01/26/2024

## 2024-08-15 ENCOUNTER — Other Ambulatory Visit (HOSPITAL_COMMUNITY): Payer: Self-pay

## 2024-08-15 MED ORDER — AMPHETAMINE-DEXTROAMPHETAMINE 20 MG PO TABS
20.0000 mg | ORAL_TABLET | Freq: Two times a day (BID) | ORAL | 0 refills | Status: DC
  Filled 2024-08-15 (×2): qty 60, 30d supply, fill #0

## 2024-08-15 NOTE — Addendum Note (Signed)
 Addended by: Trinita Devlin on: 08/15/2024 05:17 PM   Modules accepted: Orders

## 2024-08-15 NOTE — Telephone Encounter (Signed)
 Copied from CRM [id number]. Topic: Clinical - Prescription Issue >> Aug 15, 2024  9:47 AM Ashley R wrote: Reason for CRM: amphetamine -dextroamphetamine  (ADDERALL) 20 MG tablet out of stock in preferred pharmacy. Requesting transfer to:  [facility] [[id number]]  Already called to make sure they have in stock.  Callback if not possible today [phone]

## 2024-08-15 NOTE — Telephone Encounter (Signed)
 Contacted [facility] sto transfer scrip to [employer]. Controlled substances cannot be transferred. Script has to be cancelled at current and resent to preferred pharm. [facility] has [facility] the script. Pending script with preferred pharm for PCP to review and sign.

## 2024-08-18 ENCOUNTER — Telehealth: Payer: Self-pay

## 2024-08-18 ENCOUNTER — Other Ambulatory Visit (HOSPITAL_COMMUNITY): Payer: Self-pay

## 2024-08-18 NOTE — Telephone Encounter (Signed)
 Pharmacy Patient Advocate Encounter   Received notification from Physician's Office that prior authorization for Wegovy  0.5mg / 0.5 ml is required/requested.   Insurance verification completed.   The patient is insured through [facility] ADVANTAGE/RX ADVANCE.   Per test claim: The current 28 day co-pay is, $0.  No PA needed at this time. This test claim was processed through [facility]- copay amounts may vary at other pharmacies due to pharmacy/plan contracts, or as the patient moves through the different stages of their insurance plan.

## 2024-08-18 NOTE — Telephone Encounter (Signed)
 error

## 2024-08-21 ENCOUNTER — Other Ambulatory Visit (HOSPITAL_COMMUNITY): Payer: Self-pay

## 2024-08-29 ENCOUNTER — Other Ambulatory Visit (HOSPITAL_COMMUNITY): Payer: Self-pay

## 2024-08-30 ENCOUNTER — Other Ambulatory Visit (HOSPITAL_COMMUNITY): Payer: Self-pay

## 2024-08-30 ENCOUNTER — Ambulatory Visit: Admitting: Internal Medicine

## 2024-08-30 ENCOUNTER — Encounter: Payer: Self-pay | Admitting: Internal Medicine

## 2024-08-30 VITALS — BP 128/72 | HR 92 | Temp 98.2°F | Ht 71.0 in | Wt 201.8 lb

## 2024-08-30 DIAGNOSIS — F410 Panic disorder [episodic paroxysmal anxiety] without agoraphobia: Secondary | ICD-10-CM | POA: Diagnosis not present

## 2024-08-30 DIAGNOSIS — F411 Generalized anxiety disorder: Secondary | ICD-10-CM | POA: Diagnosis not present

## 2024-08-30 DIAGNOSIS — I10 Essential (primary) hypertension: Secondary | ICD-10-CM

## 2024-08-30 DIAGNOSIS — F5105 Insomnia due to other mental disorder: Secondary | ICD-10-CM | POA: Diagnosis not present

## 2024-08-30 DIAGNOSIS — E669 Obesity, unspecified: Secondary | ICD-10-CM

## 2024-08-30 DIAGNOSIS — F909 Attention-deficit hyperactivity disorder, unspecified type: Secondary | ICD-10-CM | POA: Diagnosis not present

## 2024-08-30 DIAGNOSIS — F99 Mental disorder, not otherwise specified: Secondary | ICD-10-CM

## 2024-08-30 MED ORDER — ALPRAZOLAM 0.5 MG PO TABS
0.5000 mg | ORAL_TABLET | Freq: Every day | ORAL | 0 refills | Status: AC | PRN
  Filled 2024-08-30: qty 5, 5d supply, fill #0

## 2024-08-30 MED ORDER — WEGOVY 0.5 MG/0.5ML ~~LOC~~ SOAJ
0.5000 mg | SUBCUTANEOUS | 3 refills | Status: AC
  Filled 2024-08-30: qty 2, 28d supply, fill #0

## 2024-08-30 MED ORDER — ZOLPIDEM TARTRATE 5 MG PO TABS
5.0000 mg | ORAL_TABLET | Freq: Every evening | ORAL | 0 refills | Status: AC | PRN
  Filled 2024-08-30: qty 15, 25d supply, fill #0
  Filled 2024-09-14: qty 15, 15d supply, fill #1

## 2024-08-30 NOTE — Progress Notes (Signed)
 " [facility] [address] [place] [address] Dept: [phone] Dept Fax: [phone]    Subjective:   Philip Cuevas [facility] 01-23-1981 08/30/2024  Chief Complaint  Patient presents with   Follow-up    6 months PA for wegovy     HPI: Philip Cuevas presents today for re-assessment and management of chronic medical conditions.   Discussed the use of AI scribe software for clinical note transcription with the patient, who gave verbal consent to proceed.  History of Present Illness   Philip Cuevas is a 44 year old male who presents for a routine follow-up visit.  He experiences ongoing insomnia, characterized by difficulty falling asleep and maintaining a consistent sleep schedule. He typically goes to bed around 1:30 to 2:00 AM and wakes up at 6:45 to 7:00 AM. Previous trials of Seroquel  and trazodone  have been ineffective. He has a history of using Ambien  successfully but is concerned about its addictive potential due to past substance use. Currently, he takes Seroquel  50 mg for sleep and anxiety but this is not helpful. He acknowledges that he often engages in late-night meetings that keep him stimulated.  He is on amlodipine  2.5 mg once daily for hypertension. He also takes alprazolam  sparingly for panic attacks. His anxiety is managed with Seroquel , and he requests a refill for alprazolam .  He is managed by urology for hypogonadism.   He has been using Wegovy  for obesity management.  He has gained back approximately 15 to 20 pounds due to eating habits and holidays.  He does need a refill on his Wegovy  as there were insurance/prior authorization problems with the previous prescription.  He describes a recent episode of prolonged sore throat lasting four months, which resolved spontaneously but recurred two weeks ago with a recent illness. He tested negative for COVID-19, strep, and flu at urgent care and was  treated with steroids, which resolved the symptoms. He suspects the sore throat may be related to vocal strain from frequent speaking engagements. No current symptoms of strep throat, flu, or COVID-19.     BP Readings from Last 3 Encounters:  08/30/24 128/72  08/09/24 125/83  03/13/24 123/84   Wt Readings from Last 3 Encounters:  08/30/24 201 lb 12.8 oz (91.5 kg)  08/09/24 190 lb (86.2 kg)  03/13/24 181 lb (82.1 kg)     The following portions of the patient's history were reviewed and updated as appropriate: past medical history, past surgical history, family history, social history, allergies, medications, and problem list.   Patient Active Problem List   Diagnosis Date Noted   Generalized anxiety disorder with panic attacks 08/30/2024   Attention deficit hyperactivity disorder (ADHD) 09/29/2023   Premature ejaculation 06/08/2023   Suspicious nevus 02/04/2023   Avascular necrosis of hip, 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   Tachycardia 07/11/2022   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   History of migraine headaches 08/29/2018   Venous stasis dermatitis of both lower extremities 12/22/2017   Obstructive sleep apnea 04/30/2017   Hypertriglyceridemia 03/03/2017   Left knee pain with intra-articular loose body 05/02/2012   Essential (primary) hypertension 05/07/2008   Erectile dysfunction 07/06/2007   MDD (major depressive disorder), severe (HCC) 11/24/2006   ASTHMA, EXERCISE INDUCED 11/24/2006   Insomnia 06/04/2006   Tobacco use disorder 06/04/2006   GAD 06/01/2006   Past Medical History:  Diagnosis Date   Alcohol use disorder 07/14/2022   H/o DT 06/2022     Alcohol withdrawal delirium (HCC) 07/07/2022  Anxiety    Asthma    mild, intermittent   Depression    Hypertension    Knee injury    Narcotic withdrawal (HCC) 07/12/2022   Opioid use disorder 07/06/2022   Polysubstance abuse (HCC) 07/10/2022   Tobacco use disorder 06/04/2006   Qualifier: Diagnosis of   By: Waylan ROSALEA Pao        Past Surgical History:  Procedure Laterality Date   DENTAL SURGERY     PILONIDAL CYST EXCISION N/A 04/12/2015   Procedure: CYST EXCISION PILONIDAL SIMPLE;  Surgeon: Oneil Budge, MD;  Location: [facility];  Service: General;  Laterality: N/A;   TONSILECTOMY, ADENOIDECTOMY, BILATERAL MYRINGOTOMY AND TUBES     Family History  Problem Relation Age of Onset   Hypertension Mother    Hyperlipidemia Mother    Colon cancer Mother 35   Depression Father        committed suicide   Current Medications[1] Allergies[2]   ROS: A complete ROS was performed with pertinent positives/negatives noted in the HPI. The remainder of the ROS are negative.    Objective:   Today's Vitals   08/30/24 1346  BP: 128/72  Pulse: 92  Temp: 98.2 F (36.8 C)  TempSrc: Temporal  SpO2: 99%  Weight: 201 lb 12.8 oz (91.5 kg)  Height: 5' 11 (1.803 m)    GENERAL: Well-appearing, in NAD. Well nourished.  SKIN: Pink, warm and dry.  NECK: Trachea midline. Full ROM w/o pain or tenderness. No lymphadenopathy.  RESPIRATORY: Chest wall symmetrical. Respirations even and non-labored. Breath sounds clear to auscultation bilaterally.  CARDIAC: S1, S2 present, regular rate and rhythm. Peripheral pulses 2+ bilaterally.  EXTREMITIES: Without clubbing, cyanosis, or edema.  NEUROLOGIC: Steady, even gait.  PSYCH/MENTAL STATUS: Alert, oriented x 3. Cooperative, appropriate mood and affect.   There are no preventive care reminders to display for this patient.   No results found for any visits on 08/30/24.  The 10-year ASCVD risk score (Arnett DK, et al., 2019) is: 8.7%     Assessment & Plan:  Assessment and Plan    Insomnia due to other mental disorder Chronic insomnia exacerbated by ADHD and lifestyle factors. Limited success with trazodone  and Seroquel .  - Prescribed zolpidem  5 mg once nightly for one month. - Provided sleep hygiene tips. -Discussed natural supplements such as magnesium  glycinate. - Follow up in  one month to assess effectiveness and tolerance of zolpidem .  Essential hypertension Hypertension well controlled with current medication. - Continue amlodipine  2.5 mg once daily.  Generalized anxiety disorder with panic attacks Anxiety managed with Seroquel  and sparing use of alprazolam . Recent anxiety episodes triggered by blood draws. - Refilled alprazolam  prescription.  Attention-deficit hyperactivity disorder ADHD well controlled with current medication. - Continue Adderall 20 mg BID.  Obesity Management ongoing with Wegovy .  - Sent prescription for Wegovy  0.5 mg once weekly to pharmacy. - Encouraged healthy diet and regular exercise. - Monitor weight and symptoms, consider sleep study if snoring persists after weight loss.       No images are attached to the encounter or orders placed in the encounter. Meds ordered this encounter  Medications   semaglutide -weight management (WEGOVY ) 0.5 MG/0.5ML SOAJ SQ injection    Sig: Inject 0.5 mg into the skin once a week.    Dispense:  2 mL    Refill:  3    Supervising Provider:   THOMPSON, AARON B [[id number]]   zolpidem  (AMBIEN ) 5 MG tablet    Sig: Take 1 tablet (5 mg total)  by mouth at bedtime as needed for sleep.    Dispense:  30 tablet    Refill:  0    Supervising Provider:   THOMPSON, AARON B [[id number]]   ALPRAZolam  (XANAX ) 0.5 MG tablet    Sig: Take 1 tablet (0.5 mg total) by mouth daily as needed for anxiety.    Dispense:  5 tablet    Refill:  0    Supervising Provider:   THOMPSON, AARON B [[id number]]    Return in about 1 month (around 09/30/2024).   Rosina Senters, FNP     [1]  Current Outpatient Medications:    amLODipine  (NORVASC ) 2.5 MG tablet, TAKE 1 TABLET(2.5 MG) BY MOUTH DAILY, Disp: 30 tablet, Rfl: 2   amphetamine -dextroamphetamine  (ADDERALL) 20 MG tablet, Take 1 tablet (20 mg total) by mouth 2 (two) times daily., Disp: 60 tablet, Rfl: 0   fluticasone  (FLONASE ) 50 MCG/ACT nasal spray, Place 2 sprays into both  nostrils daily. Shake well before use. Gently blow nose before spraying. Do not blow nose immediately after use. You should not taste the medication or feel it going down your throat; if you do, adjust your technique., Disp: 16 g, Rfl: 0   QUEtiapine  (SEROQUEL ) 25 MG tablet, TAKE 1 TABLET BY MOUTH EVERY NIGHT. MAY INCREASE TO 2 TABLETS BY MOUTH EVERY NIGHT AFTER A WEEK IF INSUFFICIENT EFFICACY, Disp: 90 tablet, Rfl: 3   tadalafil  (CIALIS ) 5 MG tablet, TAKE 1 TABLET(5 MG) BY MOUTH DAILY, Disp: 90 tablet, Rfl: 1   testosterone  cypionate (DEPOTESTOSTERONE CYPIONATE) 200 MG/ML injection, Inject 0.5 mLs (100 mg total) into the muscle every 14 (fourteen) days., Disp: 1 mL, Rfl: 0   Testosterone  Enanthate (XYOSTED ) 75 MG/0.5ML SOAJ, Inject 75 mg into the skin once a week., Disp: 1.96 mL, Rfl: 5   zolpidem  (AMBIEN ) 5 MG tablet, Take 1 tablet (5 mg total) by mouth at bedtime as needed for sleep., Disp: 30 tablet, Rfl: 0   ALPRAZolam  (XANAX ) 0.5 MG tablet, Take 1 tablet (0.5 mg total) by mouth daily as needed for anxiety., Disp: 5 tablet, Rfl: 0   B-D 3CC LUER-LOK SYR 18GX1-1/2 18G X 1-1/2 3 ML MISC, USE EVERY 2 WEEKS FOR TESTOSTERONE  INJECTION (Patient not taking: Reported on 08/30/2024), Disp: , Rfl:    NEEDLE, DISP, 22 G (BD DISP NEEDLES) 22G X 1-1/2 MISC, USE EVERY 2 WEEKS FOR TESTOSTERONE  INJECTIONS (Patient not taking: Reported on 08/30/2024), Disp: 50 each, Rfl: 11   semaglutide -weight management (WEGOVY ) 0.5 MG/0.5ML SOAJ SQ injection, Inject 0.5 mg into the skin once a week., Disp: 2 mL, Rfl: 3   Syringe, Disposable, (EASY GLIDE LUER LOCK SYRINGE) 1 ML MISC, 1 each by Does not apply route as needed. (Patient not taking: Reported on 08/30/2024), Disp: 25 each, Rfl: 0 [2] No Known Allergies  "

## 2024-09-03 ENCOUNTER — Other Ambulatory Visit: Payer: Self-pay | Admitting: Internal Medicine

## 2024-09-03 DIAGNOSIS — I10 Essential (primary) hypertension: Secondary | ICD-10-CM

## 2024-09-14 ENCOUNTER — Other Ambulatory Visit: Payer: Self-pay | Admitting: Internal Medicine

## 2024-09-14 ENCOUNTER — Other Ambulatory Visit (HOSPITAL_COMMUNITY): Payer: Self-pay

## 2024-09-14 DIAGNOSIS — F909 Attention-deficit hyperactivity disorder, unspecified type: Secondary | ICD-10-CM

## 2024-09-14 MED ORDER — AMPHETAMINE-DEXTROAMPHETAMINE 20 MG PO TABS
20.0000 mg | ORAL_TABLET | Freq: Two times a day (BID) | ORAL | 0 refills | Status: AC
  Filled 2024-09-14: qty 60, 30d supply, fill #0

## 2024-09-14 NOTE — Telephone Encounter (Signed)
 Pt is requesting refill for amphetamine -dextroamphetamine  (ADDERALL) 20 MG tablet   LOV  08/30/24 FOV 10/05/24 LRF 08/15/24

## 2024-09-15 ENCOUNTER — Other Ambulatory Visit (HOSPITAL_COMMUNITY): Payer: Self-pay

## 2024-10-05 ENCOUNTER — Ambulatory Visit: Admitting: Internal Medicine
